# Patient Record
Sex: Female | Born: 1955
Health system: Southern US, Community
[De-identification: ages and names within clinical notes are randomized; demographics above are authoritative.]

## PROBLEM LIST (undated history)

## (undated) DIAGNOSIS — K219 Gastro-esophageal reflux disease without esophagitis: Secondary | ICD-10-CM

## (undated) DIAGNOSIS — M549 Dorsalgia, unspecified: Secondary | ICD-10-CM

## (undated) DIAGNOSIS — M2550 Pain in unspecified joint: Secondary | ICD-10-CM

## (undated) DIAGNOSIS — F988 Other specified behavioral and emotional disorders with onset usually occurring in childhood and adolescence: Secondary | ICD-10-CM

## (undated) DIAGNOSIS — K259 Gastric ulcer, unspecified as acute or chronic, without hemorrhage or perforation: Secondary | ICD-10-CM

## (undated) DIAGNOSIS — E739 Lactose intolerance, unspecified: Secondary | ICD-10-CM

## (undated) DIAGNOSIS — R600 Localized edema: Secondary | ICD-10-CM

## (undated) DIAGNOSIS — S82843A Displaced bimalleolar fracture of unspecified lower leg, initial encounter for closed fracture: Secondary | ICD-10-CM

## (undated) DIAGNOSIS — L400 Psoriasis vulgaris: Secondary | ICD-10-CM

## (undated) DIAGNOSIS — E785 Hyperlipidemia, unspecified: Secondary | ICD-10-CM

## (undated) DIAGNOSIS — G4730 Sleep apnea, unspecified: Secondary | ICD-10-CM

## (undated) DIAGNOSIS — I10 Essential (primary) hypertension: Secondary | ICD-10-CM

## (undated) DIAGNOSIS — R5383 Other fatigue: Secondary | ICD-10-CM

## (undated) DIAGNOSIS — R7303 Prediabetes: Secondary | ICD-10-CM

## (undated) DIAGNOSIS — E119 Type 2 diabetes mellitus without complications: Secondary | ICD-10-CM

## (undated) DIAGNOSIS — K5900 Constipation, unspecified: Secondary | ICD-10-CM

## (undated) DIAGNOSIS — R0602 Shortness of breath: Secondary | ICD-10-CM

## (undated) DIAGNOSIS — Z91018 Allergy to other foods: Secondary | ICD-10-CM

## (undated) DIAGNOSIS — Z227 Latent tuberculosis: Secondary | ICD-10-CM

## (undated) HISTORY — DX: Latent tuberculosis: Z22.7

## (undated) HISTORY — DX: Lactose intolerance, unspecified: E73.9

## (undated) HISTORY — DX: Other fatigue: R53.83

## (undated) HISTORY — DX: Gastro-esophageal reflux disease without esophagitis: K21.9

## (undated) HISTORY — DX: Dorsalgia, unspecified: M54.9

## (undated) HISTORY — DX: Hyperlipidemia, unspecified: E78.5

## (undated) HISTORY — DX: Constipation, unspecified: K59.00

## (undated) HISTORY — DX: Gastric ulcer, unspecified as acute or chronic, without hemorrhage or perforation: K25.9

## (undated) HISTORY — DX: Allergy to other foods: Z91.018

## (undated) HISTORY — DX: Shortness of breath: R06.02

## (undated) HISTORY — DX: Other specified behavioral and emotional disorders with onset usually occurring in childhood and adolescence: F98.8

## (undated) HISTORY — PX: TUBAL LIGATION: SHX77

## (undated) HISTORY — DX: Pain in unspecified joint: M25.50

## (undated) HISTORY — DX: Localized edema: R60.0

## (undated) HISTORY — DX: Psoriasis vulgaris: L40.0

## (undated) HISTORY — DX: Type 2 diabetes mellitus without complications: E11.9

## (undated) HISTORY — PX: FOOT SURGERY: SHX648

## (undated) HISTORY — PX: GASTRIC BYPASS: SHX52

## (undated) HISTORY — PX: COLONOSCOPY: SHX174

---

## 1998-05-06 ENCOUNTER — Encounter: Payer: Self-pay | Admitting: Emergency Medicine

## 1998-05-06 ENCOUNTER — Emergency Department (HOSPITAL_COMMUNITY): Admission: EM | Admit: 1998-05-06 | Discharge: 1998-05-06 | Payer: Self-pay | Admitting: Emergency Medicine

## 1998-07-05 ENCOUNTER — Other Ambulatory Visit: Admission: RE | Admit: 1998-07-05 | Discharge: 1998-07-05 | Payer: Self-pay | Admitting: *Deleted

## 1999-07-06 ENCOUNTER — Other Ambulatory Visit: Admission: RE | Admit: 1999-07-06 | Discharge: 1999-07-06 | Payer: Self-pay | Admitting: *Deleted

## 1999-09-25 ENCOUNTER — Emergency Department (HOSPITAL_COMMUNITY): Admission: EM | Admit: 1999-09-25 | Discharge: 1999-09-25 | Payer: Self-pay | Admitting: Emergency Medicine

## 1999-09-25 ENCOUNTER — Encounter: Payer: Self-pay | Admitting: Emergency Medicine

## 2003-05-19 ENCOUNTER — Other Ambulatory Visit: Admission: RE | Admit: 2003-05-19 | Discharge: 2003-05-19 | Payer: Self-pay | Admitting: Internal Medicine

## 2004-05-22 ENCOUNTER — Other Ambulatory Visit: Admission: RE | Admit: 2004-05-22 | Discharge: 2004-05-22 | Payer: Self-pay | Admitting: Internal Medicine

## 2004-06-01 ENCOUNTER — Encounter: Admission: RE | Admit: 2004-06-01 | Discharge: 2004-06-01 | Payer: Self-pay | Admitting: Internal Medicine

## 2004-07-26 ENCOUNTER — Encounter: Admission: RE | Admit: 2004-07-26 | Discharge: 2004-07-26 | Payer: Self-pay | Admitting: Internal Medicine

## 2005-06-04 ENCOUNTER — Emergency Department (HOSPITAL_COMMUNITY): Admission: EM | Admit: 2005-06-04 | Discharge: 2005-06-04 | Payer: Self-pay | Admitting: Emergency Medicine

## 2006-08-15 ENCOUNTER — Encounter (INDEPENDENT_AMBULATORY_CARE_PROVIDER_SITE_OTHER): Payer: Self-pay | Admitting: Interventional Cardiology

## 2006-08-15 ENCOUNTER — Inpatient Hospital Stay (HOSPITAL_COMMUNITY): Admission: EM | Admit: 2006-08-15 | Discharge: 2006-08-19 | Payer: Self-pay | Admitting: Emergency Medicine

## 2007-04-14 ENCOUNTER — Ambulatory Visit (HOSPITAL_COMMUNITY): Admission: RE | Admit: 2007-04-14 | Discharge: 2007-04-14 | Payer: Self-pay | Admitting: Dermatology

## 2007-06-09 ENCOUNTER — Other Ambulatory Visit: Admission: RE | Admit: 2007-06-09 | Discharge: 2007-06-09 | Payer: Self-pay | Admitting: *Deleted

## 2007-06-30 ENCOUNTER — Encounter: Admission: RE | Admit: 2007-06-30 | Discharge: 2007-06-30 | Payer: Self-pay | Admitting: Internal Medicine

## 2008-05-30 ENCOUNTER — Emergency Department (HOSPITAL_COMMUNITY): Admission: EM | Admit: 2008-05-30 | Discharge: 2008-05-30 | Payer: Self-pay | Admitting: Emergency Medicine

## 2008-06-14 ENCOUNTER — Encounter: Admission: RE | Admit: 2008-06-14 | Discharge: 2008-06-14 | Payer: Self-pay | Admitting: Orthopedic Surgery

## 2008-06-24 ENCOUNTER — Encounter: Admission: RE | Admit: 2008-06-24 | Discharge: 2008-06-24 | Payer: Self-pay | Admitting: Orthopedic Surgery

## 2008-06-30 ENCOUNTER — Encounter: Admission: RE | Admit: 2008-06-30 | Discharge: 2008-06-30 | Payer: Self-pay | Admitting: Internal Medicine

## 2008-10-01 ENCOUNTER — Encounter: Admission: RE | Admit: 2008-10-01 | Discharge: 2008-10-01 | Payer: Self-pay | Admitting: Orthopedic Surgery

## 2009-01-29 ENCOUNTER — Observation Stay (HOSPITAL_COMMUNITY): Admission: EM | Admit: 2009-01-29 | Discharge: 2009-01-30 | Payer: Self-pay | Admitting: Emergency Medicine

## 2009-08-15 ENCOUNTER — Encounter: Admission: RE | Admit: 2009-08-15 | Discharge: 2009-08-15 | Payer: Self-pay | Admitting: Internal Medicine

## 2010-02-02 ENCOUNTER — Emergency Department (HOSPITAL_COMMUNITY)
Admission: EM | Admit: 2010-02-02 | Discharge: 2010-02-02 | Payer: Self-pay | Source: Home / Self Care | Admitting: Emergency Medicine

## 2010-02-05 ENCOUNTER — Emergency Department (HOSPITAL_COMMUNITY)
Admission: EM | Admit: 2010-02-05 | Discharge: 2010-02-05 | Payer: Self-pay | Source: Home / Self Care | Admitting: Emergency Medicine

## 2010-05-28 ENCOUNTER — Encounter: Payer: Self-pay | Admitting: Internal Medicine

## 2010-05-29 ENCOUNTER — Encounter
Admission: RE | Admit: 2010-05-29 | Discharge: 2010-05-29 | Payer: Self-pay | Source: Home / Self Care | Attending: Family Medicine | Admitting: Family Medicine

## 2010-05-29 ENCOUNTER — Encounter: Payer: Self-pay | Admitting: Orthopedic Surgery

## 2010-08-11 LAB — URINALYSIS, ROUTINE W REFLEX MICROSCOPIC
Bilirubin Urine: NEGATIVE
Glucose, UA: NEGATIVE mg/dL
Ketones, ur: NEGATIVE mg/dL
Protein, ur: NEGATIVE mg/dL
pH: 7 (ref 5.0–8.0)

## 2010-08-11 LAB — CSF CULTURE W GRAM STAIN: Gram Stain: NONE SEEN

## 2010-08-11 LAB — CK TOTAL AND CKMB (NOT AT ARMC)
CK, MB: 1.3 ng/mL (ref 0.3–4.0)
Relative Index: 1.1 (ref 0.0–2.5)
Total CK: 117 U/L (ref 7–177)

## 2010-08-11 LAB — POCT I-STAT, CHEM 8
BUN: 6 mg/dL (ref 6–23)
Calcium, Ion: 1.16 mmol/L (ref 1.12–1.32)
Chloride: 106 meq/L (ref 96–112)
Creatinine, Ser: 0.7 mg/dL (ref 0.4–1.2)
Glucose, Bld: 93 mg/dL (ref 70–99)
HCT: 38 % (ref 36.0–46.0)
Hemoglobin: 12.9 g/dL (ref 12.0–15.0)
Potassium: 3.7 meq/L (ref 3.5–5.1)
Sodium: 142 meq/L (ref 135–145)
TCO2: 25 mmol/L (ref 0–100)

## 2010-08-11 LAB — COMPREHENSIVE METABOLIC PANEL
ALT: 19 U/L (ref 0–35)
BUN: 5 mg/dL — ABNORMAL LOW (ref 6–23)
Calcium: 8.7 mg/dL (ref 8.4–10.5)
Glucose, Bld: 141 mg/dL — ABNORMAL HIGH (ref 70–99)
Sodium: 135 mEq/L (ref 135–145)
Total Protein: 6.7 g/dL (ref 6.0–8.3)

## 2010-08-11 LAB — POCT CARDIAC MARKERS
CKMB, poc: <1 ng/mL — ABNORMAL LOW (ref 1.0–8.0)
Myoglobin, poc: 81.7 ng/mL (ref 12–200)
Troponin i, poc: <0.05 ng/mL (ref 0.00–0.09)

## 2010-08-11 LAB — CBC
Hemoglobin: 12.4 g/dL (ref 12.0–15.0)
MCHC: 33.5 g/dL (ref 30.0–36.0)
Platelets: 303 10*3/uL (ref 150–400)
RDW: 14.3 % (ref 11.5–15.5)

## 2010-08-11 LAB — DIFFERENTIAL
Lymphs Abs: 2.1 10*3/uL (ref 0.7–4.0)
Monocytes Relative: 5 % (ref 3–12)
Neutro Abs: 2.8 10*3/uL (ref 1.7–7.7)
Neutrophils Relative %: 54 % (ref 43–77)

## 2010-08-11 LAB — LIPID PANEL
LDL Cholesterol: 118 mg/dL — ABNORMAL HIGH (ref 0–99)
Total CHOL/HDL Ratio: 3.4 RATIO
VLDL: 9 mg/dL (ref 0–40)

## 2010-08-11 LAB — CARDIAC PANEL(CRET KIN+CKTOT+MB+TROPI)
CK, MB: 1.2 ng/mL (ref 0.3–4.0)
Troponin I: 0.01 ng/mL (ref 0.00–0.06)

## 2010-08-11 LAB — T3: T3, Total: 95.4 ng/dl (ref 80.0–204.0)

## 2010-08-11 LAB — CSF CELL COUNT WITH DIFFERENTIAL
RBC Count, CSF: 83 /mm3 — ABNORMAL HIGH
WBC, CSF: 1 /mm3 (ref 0–5)

## 2010-08-11 LAB — T4: T4, Total: 6.9 ug/dL (ref 5.0–12.5)

## 2010-08-21 ENCOUNTER — Other Ambulatory Visit: Payer: Self-pay | Admitting: Internal Medicine

## 2010-08-21 DIAGNOSIS — Z1231 Encounter for screening mammogram for malignant neoplasm of breast: Secondary | ICD-10-CM

## 2010-08-24 ENCOUNTER — Ambulatory Visit
Admission: RE | Admit: 2010-08-24 | Discharge: 2010-08-24 | Disposition: A | Discharge status: Home / Self Care | Payer: BC Managed Care – PPO | Source: Ambulatory Visit | Attending: Internal Medicine | Admitting: Internal Medicine

## 2010-08-24 DIAGNOSIS — Z1231 Encounter for screening mammogram for malignant neoplasm of breast: Secondary | ICD-10-CM

## 2010-09-22 NOTE — Discharge Summary (Signed)
Teresa Newton, Teresa Newton               ACCOUNT NO.:  [id number]   MEDICAL RECORD NO.:  [id number]          PATIENT TYPE:  INP   LOCATION:  3703                         FACILITY:  [facility]   PHYSICIAN:  Lyn Records, M.D.   DATE OF BIRTH:  1956-02-27   DATE OF ADMISSION:  08/15/2006  DATE OF DISCHARGE:  08/19/2006                               DISCHARGE SUMMARY   DISCHARGE DIAGNOSES:  1. Chest pain, noncardiac.  2. Hypertension.  3. Psoriasis.  4. Family history of sudden cardiac death at age 64.   Ms. Theissen is a 55 year old female who developed a sudden onset of chest  discomfort __________.  She was admitted to [facility] for further  evaluation.   These included transthoracic echocardiogram that showed normal EF of 60%  with no regional wall motion abnormalities.  White count 5.2, hemoglobin  11.5, hematocrit 34.1, platelets 275.  Sodium 136, potassium 3.8, BUN 5,  creatinine 0.58.  LFTs normal.  Cardiac isoenzymes negative.  BNP 113.  Cholesterol 159, triglycerides 64, LDL 102, HDL 44.  Chest x-ray, no  cardiopulmonary disease.   Patient ultimately underwent a Cardiolite that showed reversibility at  the apex; therefore, the patient was scheduled cardiac catheterization.  Patient underwent catheterization on August 19, 2006; this showed  angiographically normal coronary arteries.  The patient had an Angio-  Seal device placed and was ready for discharge to home that same day.  The patient was discharged to home in stable and improved condition.   DISCHARGE MEDICATIONS:  1. Lisinopril/HCTZ 25/12.5 mg daily.  2. Baby aspirin daily.   Clean cath site gently with soap and water, no scrubbing.  Increase  activity slowly.  No lifting over ten pounds for one week.  No driving  for one day.  Remain on a low sodium, heart-healthy diet.  Call for any  questions or concerns.  Follow up in one week with Dr. Donette Larry; she is to  call to make that appointment.  Also instructed was for the  patient to  stop taking Lasix.      Guy Franco, P.A.      Lyn Records, M.D.  Electronically Signed    LB/MEDQ  D:  10/31/2006  T:  10/31/2006  Job:  [id number]   cc:   Georgann Housekeeper, MD

## 2010-09-22 NOTE — H&P (Signed)
Teresa Newton, Teresa Newton               ACCOUNT NO.:  [id number]   MEDICAL RECORD NO.:  [id number]          PATIENT TYPE:  INP   LOCATION:  1826                         FACILITY:  [facility]   PHYSICIAN:  Barnetta Chapel, MDDATE OF BIRTH:  [birth year]   DATE OF ADMISSION:  08/15/2006  DATE OF DISCHARGE:                              HISTORY & PHYSICAL   PRIMARY CARE PHYSICIAN:  Georgann Housekeeper, MD.   CHIEF COMPLAINT:  Chest pain since this morning.   HISTORY OF PRESENTING COMPLAINT:  The patient is a 55 year old female  with past medical history significant for hypertension and psoriasis.  The patient only takes hydrochlorothiazide on a p.r.n. basis for her  hypertension.  The patient has a very strong family history for coronary  artery disease.  The patient presented with a sudden onset of severe  chest pain early this morning.  The pain is rated as 10/10, sharp-like  pain, lasted for about 5-10 minutes.  The pain is relieved by  nitroglycerin.  The pain is said to radiate to the left upper arm and  across her chest.  Associated shortness of breath, diaphoresis and  nausea.  The patient denied any fever or chills.  No productive cough,  neck pain abdominal pain, vomiting, urinary symptoms or joint aches.  The patient has headaches which may be associated with nitroglycerin.  Since presentation to the ER, she has had another episode of mild chest  pain which improved again with nitroglycerin.   PAST MEDICAL HISTORY:  Essentially as above.   MEDICATIONS PRIOR TO ADMISSION:  1. Soriatane one tablet every other day.  2. Lasix p.r.n.   ALLERGIES:  NIL.   SOCIAL HISTORY:  The patient is married with four children, one female and  three females.  The patient has denied history of alcohol use, cigarette  or illicit drug use.   FAMILY HISTORY:  The patient's mother had myocardial infarction in her  59s.  Her sister, who is in her 3s, just had an open heart bypass.  The  brother also had a  myocardial infarction in his 41s.   REVIEW OF SYSTEMS:  Essentially as above.   PHYSICAL EXAMINATION ON ADMISSION:  GENERAL APPEARANCE:  The patient is  not in any distress.  VITAL SIGNS:  Temperature 97.3, blood pressure 153/79 mmHg, heart rate  75, respiratory rate 16 and O2 saturation is 100%.  HEENT:  No pallor, no jaundice.  The external occular muscle movements  are intact.  LUNGS:  Clear to auscultation.  CARDIOVASCULAR:  S1 and S2, no heart murmur appreciated.  ABDOMEN:  Obese, soft and nontender, no organomegaly.  Bowel sounds are  present.  EXTREMITIES:  No edema.  NEUROLOGIC:  Nonfocal.   LABORATORY DATA:  So far the troponin is 0.02.   The EKG revealed possible signs of LVH.   IMPRESSION:  1. Chest pain.  2. Suspect acute coronary syndrome (likely unstable angina).  3. Hypertension, uncontrolled.   PLAN:  Will admit patient to telemetry floor under Dr. Donette Larry.  Will  start patient on subcutaneous Lovenox 1 mg/kg q.12h.  Will start ACE  inhibitor, aspirin, beta-blocker and nitroglycerin.  Dr. Donette Larry has  already contacted the cardiologist.  The patient will most likely have a  cardiac stress test in the morning and we will proceed from there.  Further management will depend on hospital course.  Meanwhile, will  manage the patient's high blood pressure.  We will also check fasting  lipid profile and fasting blood sugar.      Barnetta Chapel, MD  Electronically Signed     SIO/MEDQ  D:  08/15/2006  T:  08/15/2006  Job:  [id number]   cc:   Georgann Housekeeper, MD

## 2010-09-22 NOTE — Cardiovascular Report (Signed)
NAME:  Teresa Newton, Teresa Newton               ACCOUNT NO.:  [id number]   MEDICAL RECORD NO.:  [id number]          PATIENT TYPE:  INP   LOCATION:  3703                         FACILITY:  [facility]   PHYSICIAN:  Lyn Records, M.D.   DATE OF BIRTH:  10/21/1955   DATE OF PROCEDURE:  08/19/2006  DATE OF DISCHARGE:                            CARDIAC CATHETERIZATION   INDICATIONS FOR PROCEDURE:  Chest discomfort and recent Cardiolite study  suggesting apical ischemia.   PROCEDURE PERFORMED:  1. Left heart cath.  2. Coronary angiography.  3. Left ventriculography.  4. Angio-Seal.   DATE OF PROCEDURE:  August 19, 2006.   DESCRIPTION OF PROCEDURE:  After informed consent, a 6-French sheath was  placed on the right femoral artery using a modified Seldinger technique.  A 6-French A2 multipurpose catheter was used for hemodynamic recordings,  left ventriculography by hand injection, and selective right coronary  angiography.  A number four 6-French left Judkins catheter was used for  left coronary angiography.  Angio-Seal was used for hemostasis post  procedure.  The patient tolerated the procedure without complications.   RESULTS:  1. Hemodynamic data:      a.     Aortic pressure 154/80.      b.     Left ventricular pressure 154/4 mmHg.  2. Left ventriculography:  Left ventricular catheter size and function      normal EF is 60% to 70%.  3. Coronary angiography:      a.     Left main coronary:  Normal.      b.     Left anterior descending coronary:  Left anterior descending       coronary artery is a large vessel that wraps around the left       ventricular apex and in the distal vessel near the origin of the       second diagonal there is an eccentric 30% to 40% narrowing, but no       high grade obstruction is felt to be present in this region.  A       large proximal diagonal branch rises and is also free of       obstruction.      c.     Circumflex artery:  Circumflex coronary artery is down  a       vessel, gives a very large obtuse marginal distally, a more       moderate sized obtuse marginal proximally, and it is normal       throughout its course.      d.     Right coronary is nondominant.  The vessel is normal.   CONCLUSION:  1. Distal less than 40% LAD stenosis, otherwise normal coronary      arteries.  2. Normal left ventricular function.  3. Probable false positive Cardiolite unless the LAD lesion previously      contained thrombus due to plaque rupture, which is doubtful.   PLAN:  Risk factor modification and delayed later this morning.  Discharge later today if no symptoms.      Lyn Records,  M.D.     HWS/MEDQ  D:  08/19/2006  T:  08/19/2006  Job:  [id number]   cc:   Georgann Housekeeper, MD
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
## 2011-06-08 ENCOUNTER — Encounter (HOSPITAL_COMMUNITY): Payer: Self-pay | Admitting: Pharmacist

## 2011-06-12 ENCOUNTER — Inpatient Hospital Stay (HOSPITAL_COMMUNITY): Admission: RE | Admit: 2011-06-12 | Payer: BC Managed Care – PPO | Source: Ambulatory Visit

## 2011-06-15 ENCOUNTER — Encounter (HOSPITAL_COMMUNITY): Admission: RE | Payer: Self-pay | Source: Ambulatory Visit

## 2011-06-15 ENCOUNTER — Ambulatory Visit (HOSPITAL_COMMUNITY)
Admission: RE | Admit: 2011-06-15 | Payer: BC Managed Care – PPO | Source: Ambulatory Visit | Admitting: Obstetrics and Gynecology

## 2011-06-15 SURGERY — DILATATION AND CURETTAGE /HYSTEROSCOPY
Anesthesia: Choice

## 2011-08-08 ENCOUNTER — Other Ambulatory Visit: Payer: Self-pay | Admitting: Internal Medicine

## 2011-08-08 DIAGNOSIS — Z1231 Encounter for screening mammogram for malignant neoplasm of breast: Secondary | ICD-10-CM

## 2011-08-17 ENCOUNTER — Ambulatory Visit: Payer: BC Managed Care – PPO

## 2011-08-27 ENCOUNTER — Ambulatory Visit
Admission: RE | Admit: 2011-08-27 | Discharge: 2011-08-27 | Disposition: A | Discharge status: Home / Self Care | Payer: BC Managed Care – PPO | Source: Ambulatory Visit | Attending: Internal Medicine | Admitting: Internal Medicine

## 2011-08-27 DIAGNOSIS — Z1231 Encounter for screening mammogram for malignant neoplasm of breast: Secondary | ICD-10-CM

## 2012-10-02 ENCOUNTER — Other Ambulatory Visit: Payer: Self-pay | Admitting: Internal Medicine

## 2012-10-02 DIAGNOSIS — Z1231 Encounter for screening mammogram for malignant neoplasm of breast: Secondary | ICD-10-CM

## 2012-10-08 ENCOUNTER — Ambulatory Visit
Admission: RE | Admit: 2012-10-08 | Discharge: 2012-10-08 | Disposition: A | Discharge status: Home / Self Care | Payer: BC Managed Care – PPO | Source: Ambulatory Visit | Attending: Internal Medicine | Admitting: Internal Medicine

## 2012-10-08 DIAGNOSIS — Z1231 Encounter for screening mammogram for malignant neoplasm of breast: Secondary | ICD-10-CM

## 2013-09-14 ENCOUNTER — Other Ambulatory Visit: Payer: Self-pay

## 2013-09-14 DIAGNOSIS — Z1231 Encounter for screening mammogram for malignant neoplasm of breast: Secondary | ICD-10-CM

## 2013-10-09 ENCOUNTER — Ambulatory Visit
Admission: RE | Admit: 2013-10-09 | Discharge: 2013-10-09 | Disposition: A | Discharge status: Home / Self Care | Payer: BC Managed Care – PPO | Source: Ambulatory Visit

## 2013-10-09 ENCOUNTER — Encounter (INDEPENDENT_AMBULATORY_CARE_PROVIDER_SITE_OTHER): Payer: Self-pay

## 2013-10-09 DIAGNOSIS — Z1231 Encounter for screening mammogram for malignant neoplasm of breast: Secondary | ICD-10-CM

## 2014-04-13 ENCOUNTER — Other Ambulatory Visit: Payer: Self-pay | Admitting: Family

## 2014-04-13 ENCOUNTER — Ambulatory Visit
Admission: RE | Admit: 2014-04-13 | Discharge: 2014-04-13 | Disposition: A | Discharge status: Home / Self Care | Payer: BC Managed Care – PPO | Source: Ambulatory Visit | Attending: Family | Admitting: Family

## 2014-04-13 DIAGNOSIS — M542 Cervicalgia: Secondary | ICD-10-CM

## 2014-04-13 DIAGNOSIS — M545 Low back pain: Secondary | ICD-10-CM

## 2014-04-13 DIAGNOSIS — R200 Anesthesia of skin: Secondary | ICD-10-CM

## 2014-04-13 DIAGNOSIS — R079 Chest pain, unspecified: Secondary | ICD-10-CM

## 2014-09-06 ENCOUNTER — Other Ambulatory Visit: Payer: Self-pay

## 2014-09-06 DIAGNOSIS — Z1231 Encounter for screening mammogram for malignant neoplasm of breast: Secondary | ICD-10-CM

## 2014-10-18 ENCOUNTER — Ambulatory Visit
Admission: RE | Admit: 2014-10-18 | Discharge: 2014-10-18 | Disposition: A | Discharge status: Home / Self Care | Payer: BC Managed Care – PPO | Source: Ambulatory Visit

## 2014-10-18 ENCOUNTER — Encounter (INDEPENDENT_AMBULATORY_CARE_PROVIDER_SITE_OTHER): Payer: Self-pay

## 2014-10-18 DIAGNOSIS — Z1231 Encounter for screening mammogram for malignant neoplasm of breast: Secondary | ICD-10-CM

## 2015-08-04 ENCOUNTER — Other Ambulatory Visit: Payer: Self-pay | Admitting: Internal Medicine

## 2015-08-04 DIAGNOSIS — R311 Benign essential microscopic hematuria: Secondary | ICD-10-CM

## 2015-08-09 ENCOUNTER — Other Ambulatory Visit: Payer: BC Managed Care – PPO

## 2015-08-23 ENCOUNTER — Encounter (HOSPITAL_COMMUNITY): Payer: Self-pay | Admitting: Emergency Medicine

## 2015-08-23 ENCOUNTER — Emergency Department (HOSPITAL_COMMUNITY)
Admission: EM | Admit: 2015-08-23 | Discharge: 2015-08-23 | Disposition: A | Discharge status: Home / Self Care | Payer: BC Managed Care – PPO | Attending: Emergency Medicine | Admitting: Emergency Medicine

## 2015-08-23 ENCOUNTER — Emergency Department (HOSPITAL_COMMUNITY): Payer: BC Managed Care – PPO

## 2015-08-23 DIAGNOSIS — Z880 Allergy status to penicillin: Secondary | ICD-10-CM | POA: Insufficient documentation

## 2015-08-23 DIAGNOSIS — Z7982 Long term (current) use of aspirin: Secondary | ICD-10-CM | POA: Insufficient documentation

## 2015-08-23 DIAGNOSIS — R109 Unspecified abdominal pain: Secondary | ICD-10-CM

## 2015-08-23 DIAGNOSIS — E119 Type 2 diabetes mellitus without complications: Secondary | ICD-10-CM | POA: Insufficient documentation

## 2015-08-23 DIAGNOSIS — K529 Noninfective gastroenteritis and colitis, unspecified: Secondary | ICD-10-CM | POA: Diagnosis not present

## 2015-08-23 DIAGNOSIS — Z79899 Other long term (current) drug therapy: Secondary | ICD-10-CM | POA: Insufficient documentation

## 2015-08-23 DIAGNOSIS — I10 Essential (primary) hypertension: Secondary | ICD-10-CM | POA: Diagnosis not present

## 2015-08-23 DIAGNOSIS — R1084 Generalized abdominal pain: Secondary | ICD-10-CM | POA: Diagnosis present

## 2015-08-23 HISTORY — DX: Essential (primary) hypertension: I10

## 2015-08-23 LAB — COMPREHENSIVE METABOLIC PANEL
ALK PHOS: 76 U/L (ref 38–126)
ALT: 38 U/L (ref 14–54)
ANION GAP: 9 (ref 5–15)
AST: 37 U/L (ref 15–41)
Albumin: 3.9 g/dL (ref 3.5–5.0)
BUN: 19 mg/dL (ref 6–20)
CALCIUM: 9.1 mg/dL (ref 8.9–10.3)
CO2: 25 mmol/L (ref 22–32)
Chloride: 104 mmol/L (ref 101–111)
Creatinine, Ser: 1.07 mg/dL — ABNORMAL HIGH (ref 0.44–1.00)
GFR calc non Af Amer: 56 mL/min — ABNORMAL LOW (ref >60)
GLUCOSE: 132 mg/dL — AB (ref 65–99)
Potassium: 3.2 mmol/L — ABNORMAL LOW (ref 3.5–5.1)
Sodium: 138 mmol/L (ref 135–145)
Total Bilirubin: 0.7 mg/dL (ref 0.3–1.2)
Total Protein: 7.4 g/dL (ref 6.5–8.1)

## 2015-08-23 LAB — URINE MICROSCOPIC-ADD ON: RBC / HPF: NONE SEEN RBC/hpf (ref 0–5)

## 2015-08-23 LAB — LIPASE, BLOOD: Lipase: 24 U/L (ref 11–51)

## 2015-08-23 LAB — URINALYSIS, ROUTINE W REFLEX MICROSCOPIC
BILIRUBIN URINE: NEGATIVE
GLUCOSE, UA: NEGATIVE mg/dL
Ketones, ur: 15 mg/dL — AB
Leukocytes, UA: NEGATIVE
Nitrite: NEGATIVE
Protein, ur: NEGATIVE mg/dL
SPECIFIC GRAVITY, URINE: 1.019 (ref 1.005–1.030)
pH: 5.5 (ref 5.0–8.0)

## 2015-08-23 LAB — CBC
HCT: 37.4 % (ref 36.0–46.0)
HEMOGLOBIN: 12.1 g/dL (ref 12.0–15.0)
MCH: 29.3 pg (ref 26.0–34.0)
MCHC: 32.4 g/dL (ref 30.0–36.0)
MCV: 90.6 fL (ref 78.0–100.0)
Platelets: 215 10*3/uL (ref 150–400)
RBC: 4.13 MIL/uL (ref 3.87–5.11)
RDW: 13.5 % (ref 11.5–15.5)
WBC: 4 10*3/uL (ref 4.0–10.5)

## 2015-08-23 MED ORDER — SODIUM CHLORIDE 0.9 % IV BOLUS (SEPSIS)
1000.0000 mL | Freq: Once | INTRAVENOUS | Status: AC
  Administered 2015-08-23: 1000 mL via INTRAVENOUS

## 2015-08-23 MED ORDER — ONDANSETRON HCL 4 MG/2ML IJ SOLN
4.0000 mg | Freq: Once | INTRAMUSCULAR | Status: DC
  Filled 2015-08-23: qty 2

## 2015-08-23 MED ORDER — IOPAMIDOL (ISOVUE-300) INJECTION 61%
INTRAVENOUS | Status: AC
  Administered 2015-08-23: 100 mL
  Filled 2015-08-23: qty 100

## 2015-08-23 NOTE — Discharge Instructions (Signed)
Return here as needed.  Follow-up with a primary care doctor.  Your CT scan did not show any abnormalities.  However, this could be an early process that has yet to slowly declare itself.  Monitor your symptoms and return for any worsening in your condition

## 2015-08-23 NOTE — ED Notes (Signed)
Pt sts mid abd pain and nausea upon waking today; pt sts hx of gastric bypass

## 2015-08-23 NOTE — ED Provider Notes (Signed)
CSN: [id number]     Arrival date & time 08/23/15  1342 History   First MD Initiated Contact with Patient 08/23/15 1629     Chief Complaint  Patient presents with  . Abdominal Pain  . Nausea   HPI  The patient is a 60 year old black female PMH of HTN, DMT2 diet controlled, gastric bypass 2 years prior who presented today with acute abdominal pain 10/10 onset which awoke patient from nap at noon. Prior to that patient exercised, ate breakfast and went about her regular morning when awoke with pain. At that point she could not localize pain, generalized abdomen, mild nausea with sweating and lightheadedness, denies recent fever, vomiting, or bowel changes no diarrhea or constipation symptoms. Since gastric bypass patient has never experienced dumping syndrome or acute abdominal pain following surgery, she continues to take her vitamins daily. Since then the pain has subsided slightly and migrated to the left lower quadrant and now she states it is on and off since presenting to the ED.The patient denies chest pain, shortness of breath, headache,blurred vision, neck pain, fever, cough, weakness, numbness, dizziness, anorexia, edema, rash, back pain, dysuria, hematemesis, bloody stool, near syncope, or syncope.   Past Medical History  Diagnosis Date  . Hypertension   . Diabetes mellitus without complication (HCC)    History reviewed. No pertinent past surgical history. History reviewed. No pertinent family history. Social History  Substance Use Topics  . Smoking status: Never Smoker   . Smokeless tobacco: None  . Alcohol Use: No   OB History    No data available     Review of Systems  Constitutional: Negative for fever, diaphoresis and appetite change.  Respiratory: Negative for shortness of breath.   Cardiovascular: Negative for chest pain and palpitations.  Gastrointestinal: Positive for nausea and abdominal pain. Negative for vomiting, diarrhea, constipation, blood in stool and abdominal  distention.    Allergies  Penicillins  Home Medications   Prior to Admission medications   Medication Sig Start Date End Date Taking? Authorizing Provider  aspirin 81 MG tablet Take 81 mg by mouth daily.    Historical Provider, MD  Liraglutide (VICTOZA) 18 MG/3ML SOLN Inject 1.8 mg into the skin daily.    Historical Provider, MD  lisinopril-hydrochlorothiazide (PRINZIDE,ZESTORETIC) 20-25 MG per tablet Take 1 tablet by mouth daily.    Historical Provider, MD   BP 113/61 mmHg  Pulse 50  Temp(Src) 97.9 F (36.6 C) (Oral)  Resp 11  SpO2 100% Physical Exam  Constitutional: She is oriented to person, place, and time. She appears well-developed and well-nourished.  HENT:  Head: Normocephalic and atraumatic.  Mouth/Throat: Oropharynx is clear and moist.  Eyes: Pupils are equal, round, and reactive to light.  Neck: Normal range of motion. Neck supple.  Cardiovascular: Normal rate, regular rhythm, normal heart sounds and intact distal pulses.  Exam reveals no friction rub.   No murmur heard. Pulmonary/Chest: Effort normal and breath sounds normal. No respiratory distress. She has no wheezes. She has no rales.  Abdominal: Soft. Bowel sounds are normal. She exhibits no distension. There is tenderness. There is no rebound and no guarding.  Tenderness to palpation over RUQ and RLQ, no rebound tenderness, mild guarding with right sided exam. No left sided abdominal tenderness.  Neurological: She is alert and oriented to person, place, and time. She exhibits normal muscle tone. Coordination normal.  Skin: Skin is warm and dry. No rash noted. No erythema.  Nursing note and vitals reviewed.  ED Course  Procedures (including critical care time) Labs Review Labs Reviewed  COMPREHENSIVE METABOLIC PANEL - Abnormal; Notable for the following:    Potassium 3.2 (*)    Glucose, Bld 132 (*)    Creatinine, Ser 1.07 (*)    GFR calc non Af Amer 56 (*)    All other components within normal limits   URINALYSIS, ROUTINE W REFLEX MICROSCOPIC (NOT AT [facility]) - Abnormal; Notable for the following:    Hgb urine dipstick TRACE (*)    Ketones, ur 15 (*)    All other components within normal limits  URINE MICROSCOPIC-ADD ON - Abnormal; Notable for the following:    Squamous Epithelial / LPF 0-5 (*)    Bacteria, UA FEW (*)    All other components within normal limits  URINE CULTURE  LIPASE, BLOOD  CBC    Imaging Review Ct Abdomen Pelvis W Contrast  08/23/2015  CLINICAL DATA:  Acute generalized abdominal pain. EXAM: CT ABDOMEN AND PELVIS WITH CONTRAST TECHNIQUE: Multidetector CT imaging of the abdomen and pelvis was performed using the standard protocol following bolus administration of intravenous contrast. CONTRAST:  1 ISOVUE-300 IOPAMIDOL (ISOVUE-300) INJECTION 61% COMPARISON:  None. FINDINGS: Severe degenerative disc disease is seen at L5-S1. Visualized lung bases are unremarkable. No gallstones are noted. Status post gastric bypass. The liver, spleen and pancreas appear normal. Adrenal glands and kidneys appear normal. No hydronephrosis or renal obstruction is noted. The appendix appears normal. There is no evidence of bowel obstruction. No abnormal fluid collection is noted. Urinary bladder appears normal. Uterus and ovaries are unremarkable. No significant adenopathy is noted. IMPRESSION: No acute abnormality seen in the abdomen or pelvis. Electronically Signed   By: Lupita RaiderJames  Green Jr, M.D.   On: 08/23/2015 18:38   I have personally reviewed and evaluated these images and lab results as part of my medical decision-making.   MDM   The patient is a 60 year old black female presents with acute onset of abdominal pain. CT ordered to r/o appendicitis vs diverticulitis vs gallbladder dz vs pancreatitis. Due to acute onset of pain, appendicitis is less likely however physical exam shows RLQ tenderness. CMP reveals mild dehydration and hypokalemia, possibly due to hx of gastric bypass vs hypertensive  medications.   Patient will be discharged home with a gastroenteritis and abdominal pain of undifferentiated origin.  The patient is advised this could be an early process that is yet fully declared itself.  The patient agrees the plan and all actions were answered.  She is advised to follow with her primary care doctor  Charlestine NightChristopher Payge Eppes, PA-C 08/23/15 1926  Derwood KaplanAnkit Nanavati, MD 08/24/15 1329

## 2015-08-23 NOTE — ED Notes (Signed)
Pt left with all her belongings and ambulated out the treatment area.  

## 2015-08-23 NOTE — ED Notes (Signed)
Patient transported to CT 

## 2015-08-23 NOTE — ED Notes (Signed)
Pt left with all her belongings and ambulated out of the treatment area.  

## 2015-08-27 LAB — URINE CULTURE: Culture: >=100000 — AB

## 2015-08-28 ENCOUNTER — Telehealth: Payer: Self-pay

## 2015-08-28 NOTE — Telephone Encounter (Signed)
Post ED Visit - Positive Culture Follow-up  Culture report reviewed by antimicrobial stewardship pharmacist:  []  Teresa Newton, Pharm.D. []  Teresa Newton, Pharm.D., BCPS []  Teresa Newton, Pharm.D. []  Teresa Newton, Pharm.D., BCPS []  Teresa Newton, [address]Ph[address] BCPS, AAHIVP []  Teresa Newton, Pharm.D., BCPS, AAHIVP [x]  Teresa Newton, Pharm.D. []  Teresa Newton, [address]Ph[address]  Positive urine culture no further patient follow-up is required at this time.  Teresa Newton, Teresa Newton 08/28/2015, 8:44 AM

## 2015-08-28 NOTE — Progress Notes (Signed)
ED Antimicrobial Stewardship Positive Culture Follow Up   Teresa Newton is an 60 y.o. female [facility] with a chief complaint of  Chief Complaint  Patient presents with  . Abdominal Pain  . Nausea    Recent Results (from the past 720 hour(s))  Urine culture     Status: Abnormal   Collection Time: 08/23/15  4:57 PM  Result Value Ref Range Status   Specimen Description URINE, RANDOM  Final   Special Requests NONE  Final   Culture (A)  Final    >=100,000 COLONIES/mL ESCHERICHIA COLI 50,000 COLONIES/mL KLEBSIELLA PNEUMONIAE    Report Status 08/27/2015 FINAL  Final   Organism ID, Bacteria ESCHERICHIA COLI (A)  Final   Organism ID, Bacteria KLEBSIELLA PNEUMONIAE (A)  Final      Susceptibility   Escherichia coli - MIC*    AMPICILLIN 8 SENSITIVE Sensitive     CEFAZOLIN <=4 SENSITIVE Sensitive     CEFTRIAXONE <=1 SENSITIVE Sensitive     CIPROFLOXACIN <=0.25 SENSITIVE Sensitive     GENTAMICIN <=1 SENSITIVE Sensitive     IMIPENEM <=0.25 SENSITIVE Sensitive     NITROFURANTOIN <=16 SENSITIVE Sensitive     TRIMETH/SULFA <=20 SENSITIVE Sensitive     AMPICILLIN/SULBACTAM 4 SENSITIVE Sensitive     PIP/TAZO <=4 SENSITIVE Sensitive     * >=100,000 COLONIES/mL ESCHERICHIA COLI   Klebsiella pneumoniae - MIC*    AMPICILLIN >=32 RESISTANT Resistant     CEFAZOLIN <=4 SENSITIVE Sensitive     CEFTRIAXONE <=1 SENSITIVE Sensitive     CIPROFLOXACIN <=0.25 SENSITIVE Sensitive     GENTAMICIN <=1 SENSITIVE Sensitive     IMIPENEM <=0.25 SENSITIVE Sensitive     NITROFURANTOIN 32 SENSITIVE Sensitive     TRIMETH/SULFA <=20 SENSITIVE Sensitive     AMPICILLIN/SULBACTAM 4 SENSITIVE Sensitive     PIP/TAZO 8 SENSITIVE Sensitive     * 50,000 COLONIES/mL KLEBSIELLA PNEUMONIAE    No urinary symptoms. UA negative. No treatment indicated.  ED Provider: Blane OharaJoshua Zavitz, MD  Cassie L. Roseanne RenoStewart, PharmD PGY2 Infectious Diseases Pharmacy Resident Pager: [phone] 08/28/2015 8:34  AM

## 2015-10-07 ENCOUNTER — Other Ambulatory Visit: Payer: Self-pay | Admitting: Internal Medicine

## 2015-10-07 ENCOUNTER — Other Ambulatory Visit: Payer: Self-pay

## 2015-10-07 DIAGNOSIS — Z1231 Encounter for screening mammogram for malignant neoplasm of breast: Secondary | ICD-10-CM

## 2015-10-18 ENCOUNTER — Ambulatory Visit (INDEPENDENT_AMBULATORY_CARE_PROVIDER_SITE_OTHER): Payer: BC Managed Care – PPO | Admitting: Internal Medicine

## 2015-10-18 ENCOUNTER — Encounter: Payer: Self-pay | Admitting: Internal Medicine

## 2015-10-18 ENCOUNTER — Ambulatory Visit
Admission: RE | Admit: 2015-10-18 | Discharge: 2015-10-18 | Disposition: A | Discharge status: Home / Self Care | Payer: BC Managed Care – PPO | Source: Ambulatory Visit | Attending: Internal Medicine | Admitting: Internal Medicine

## 2015-10-18 VITALS — BP 132/74 | HR 80 | Temp 98.7°F | Wt 194.0 lb

## 2015-10-18 DIAGNOSIS — L409 Psoriasis, unspecified: Secondary | ICD-10-CM

## 2015-10-18 DIAGNOSIS — Z227 Latent tuberculosis: Secondary | ICD-10-CM

## 2015-10-18 DIAGNOSIS — R7611 Nonspecific reaction to tuberculin skin test without active tuberculosis: Secondary | ICD-10-CM

## 2015-10-18 NOTE — Progress Notes (Signed)
RFV: referred to clinic for LTBI for biologics for severe plaque psoriasis Subjective:    Patient ID: Teresa Newton, female    DOB: [birth year], 60 y.o.   MRN: [id number]  HPI 60yo F with history of plaque psoriasis is referred to clinic due to positive quantiferon. The patient reports that when she lived in [place], her mother was diagnosed with mTB. The patient and her siblings had all been exposed to mTB from her mother, and were treated for LTBI as a child (U40 yo).  She was enrolled in clinical trial for cosentyx, IL-17a antagonist in 2012 to treat her severe plaque psoriasis. She had been on the medication for roughly 5 years without difficulty and had good response to treatment. She was initially screened with QTF, but mentioned her mTB exposure/LTBI treatment and also screened by CXR. She had been off of her meds for 6 months but started to have recurrence of her psoriasis. She is being seen by Dr. Sharyn Lull who did baseline labs for consideration of reinitiation of cosyntex. Her QTF is positive, and in her old records, her QTF in 2012 was negative  The patient still does not endorse any symptoms concerning for pulmonary TB. She does not have any risk factors that would be suggestive re-exposure to pulmonary TB. She is a retired Surveyor, mining, no volunteering in homeless shelters or prisons. No recent contact from health dept to suggest re-exposure.  She has upcoming cruise to [place] and [place]  Allergies  Allergen Reactions  . Fish Allergy Anaphylaxis  . Milk-Related Compounds Anaphylaxis    Goat Milk ONLY   . Penicillins Hives   Current Outpatient Prescriptions on File Prior to Visit  Medication Sig Dispense Refill  . aspirin 81 MG tablet Take 81 mg by mouth daily.    Marland Kitchen CALCIUM PO Take 1 tablet by mouth daily.    . Cyanocobalamin (B-12 PO) Take 1 tablet by mouth daily.    Marland Kitchen EPINEPHrine 0.3 mg/0.3 mL IJ SOAJ injection Inject 0.3 mg into the muscle once.    .  hydrochlorothiazide (MICROZIDE) 12.5 MG capsule Take 12.5 mg by mouth daily.    Marland Kitchen lisinopril-hydrochlorothiazide (PRINZIDE,ZESTORETIC) 20-25 MG per tablet Take 1 tablet by mouth daily.    . Multiple Vitamin (MULTI VITAMIN PO) Take 1 tablet by mouth daily.    . Omega-3 Fatty Acids (OMEGA 3 PO) Take 1 capsule by mouth daily.    Marland Kitchen POTASSIUM PO Take 1 tablet by mouth daily.    . Prenatal Vit-Fe Fumarate-FA (PRENATAL PO) Take 1 tablet by mouth daily.     No current facility-administered medications on file prior to visit.   Active Ambulatory Problems    Diagnosis Date Noted  . No Active Ambulatory Problems   Resolved Ambulatory Problems    Diagnosis Date Noted  . No Resolved Ambulatory Problems   Past Medical History  Diagnosis Date  . Hypertension   . Diabetes mellitus without complication [facility])    Social History  Substance Use Topics  . Smoking status: Never Smoker   . Smokeless tobacco: Never Used  . Alcohol Use: No   Family hx: psoriasis- brother. Mother with mTB    Review of Systems Review of Systems  Constitutional: Negative for fever, chills, diaphoresis, activity change, appetite change, fatigue and unexpected weight change.  HENT: Negative for congestion, sore throat, rhinorrhea, sneezing, trouble swallowing and sinus pressure.  Eyes: Negative for photophobia and visual disturbance.  Respiratory: Negative for cough, chest tightness, shortness of breath, wheezing  and stridor.  Cardiovascular: Negative for chest pain, palpitations and leg swelling.  Gastrointestinal: Negative for nausea, vomiting, abdominal pain, diarrhea, constipation, blood in stool, abdominal distention and anal bleeding.  Genitourinary: Negative for dysuria, hematuria, flank pain and difficulty urinating.  Musculoskeletal: Negative for myalgias, back pain, joint swelling, arthralgias and gait problem.  Skin: Negative for color change, pallor, rash and wound.  Neurological: Negative for dizziness,  tremors, weakness and light-headedness.  Hematological: Negative for adenopathy. Does not bruise/bleed easily.  Psychiatric/Behavioral: Negative for behavioral problems, confusion, sleep disturbance, dysphoric mood, decreased concentration and agitation.       Objective:   Physical Exam  BP 132/74 mmHg  Pulse 80  Temp(Src) 98.7 F (37.1 C) (Oral)  Wt 194 lb (87.998 kg) Physical Exam  Constitutional:  oriented to person, place, and time. appears well-developed and well-nourished. No distress.  HENT: [place]/AT, PERRLA, no scleral icterus Mouth/Throat: Oropharynx is clear and moist. No oropharyngeal exudate.  Cardiovascular: Normal rate, regular rhythm and normal heart sounds. Exam reveals no gallop and no friction rub.  No murmur heard.  Pulmonary/Chest: Effort normal and breath sounds normal. No respiratory distress.  has no wheezes.  Neck = supple, no nuchal rigidity Lymphadenopathy: no cervical adenopathy. No axillary adenopathy Skin: Skin is warm and dry. No rash noted. No erythema.  Psychiatric: a normal mood and affect.  behavior is normal.     Assessment & Plan:   History of mTB exposure/LTBI treatment as a child = agree that future screening should be based on symptom questionnaire and serial cxr. We Newton get cxr today to see if any signs concerning for active TB. She does not have any symptoms to suggest active infection.  I reviewed records from [facility] to see mTB screening results which was only done once at start of clinical trial and reported as negative  Newton repeat QTF and HIV ab and do CXR since not recently done.  Addendum:  CXR does not show signs suggestive of active TB Repeat QTF was negative HIV ab is negative as health screening -----------------------------  Recommendations: she does not need latent TB treatment prior to initiation cosentyx at this time. She has already received treatment as a child. Recent studies do not suggest that she has had  re-exposure.

## 2015-10-19 ENCOUNTER — Ambulatory Visit
Admission: RE | Admit: 2015-10-19 | Discharge: 2015-10-19 | Disposition: A | Discharge status: Home / Self Care | Payer: BC Managed Care – PPO | Source: Ambulatory Visit | Attending: Internal Medicine | Admitting: Internal Medicine

## 2015-10-19 DIAGNOSIS — Z1231 Encounter for screening mammogram for malignant neoplasm of breast: Secondary | ICD-10-CM

## 2015-10-19 LAB — HIV ANTIBODY (ROUTINE TESTING W REFLEX): HIV: NONREACTIVE

## 2015-10-21 LAB — QUANTIFERON TB GOLD ASSAY (BLOOD)
Interferon Gamma Release Assay: NEGATIVE
MITOGEN-NIL SO: 7.88 [IU]/mL
QUANTIFERON NIL VALUE: 0.05 [IU]/mL
QUANTIFERON TB AG MINUS NIL: 0.26 [IU]/mL

## 2015-11-01 ENCOUNTER — Encounter: Payer: Self-pay | Admitting: *Deleted

## 2016-09-19 ENCOUNTER — Other Ambulatory Visit: Payer: Self-pay | Admitting: Internal Medicine

## 2016-09-19 DIAGNOSIS — Z1231 Encounter for screening mammogram for malignant neoplasm of breast: Secondary | ICD-10-CM

## 2016-09-26 ENCOUNTER — Other Ambulatory Visit: Payer: Self-pay | Admitting: Obstetrics and Gynecology

## 2016-09-26 DIAGNOSIS — E2839 Other primary ovarian failure: Secondary | ICD-10-CM

## 2016-10-19 ENCOUNTER — Ambulatory Visit
Admission: RE | Admit: 2016-10-19 | Discharge: 2016-10-19 | Disposition: A | Discharge status: Home / Self Care | Payer: BC Managed Care – PPO | Source: Ambulatory Visit | Attending: Internal Medicine | Admitting: Internal Medicine

## 2016-10-19 DIAGNOSIS — Z1231 Encounter for screening mammogram for malignant neoplasm of breast: Secondary | ICD-10-CM

## 2017-09-10 ENCOUNTER — Other Ambulatory Visit: Payer: Self-pay | Admitting: Internal Medicine

## 2017-09-10 ENCOUNTER — Other Ambulatory Visit: Payer: Self-pay | Admitting: Obstetrics and Gynecology

## 2017-09-10 DIAGNOSIS — Z1231 Encounter for screening mammogram for malignant neoplasm of breast: Secondary | ICD-10-CM

## 2017-10-21 ENCOUNTER — Ambulatory Visit
Admission: RE | Admit: 2017-10-21 | Discharge: 2017-10-21 | Disposition: A | Discharge status: Home / Self Care | Payer: BC Managed Care – PPO | Source: Ambulatory Visit | Attending: Internal Medicine | Admitting: Internal Medicine

## 2017-10-21 DIAGNOSIS — Z1231 Encounter for screening mammogram for malignant neoplasm of breast: Secondary | ICD-10-CM

## 2017-11-04 DIAGNOSIS — Z6836 Body mass index (BMI) 36.0-36.9, adult: Secondary | ICD-10-CM | POA: Insufficient documentation

## 2017-11-04 DIAGNOSIS — E119 Type 2 diabetes mellitus without complications: Secondary | ICD-10-CM | POA: Insufficient documentation

## 2017-11-04 DIAGNOSIS — I10 Essential (primary) hypertension: Secondary | ICD-10-CM | POA: Insufficient documentation

## 2017-11-04 DIAGNOSIS — I152 Hypertension secondary to endocrine disorders: Secondary | ICD-10-CM | POA: Insufficient documentation

## 2017-11-04 DIAGNOSIS — E1169 Type 2 diabetes mellitus with other specified complication: Secondary | ICD-10-CM | POA: Insufficient documentation

## 2017-11-04 LAB — BASIC METABOLIC PANEL
BUN: 21 (ref 4–21)
Creatinine: 1 (ref 0.5–1.1)
Glucose: 60
POTASSIUM: 4.4 (ref 3.4–5.3)
Sodium: 138 (ref 137–147)

## 2017-11-04 LAB — HEMOGLOBIN A1C: HEMOGLOBIN A1C: 5.6

## 2018-01-20 ENCOUNTER — Emergency Department (HOSPITAL_COMMUNITY): Payer: BC Managed Care – PPO

## 2018-01-20 ENCOUNTER — Encounter (HOSPITAL_COMMUNITY): Payer: Self-pay | Admitting: *Deleted

## 2018-01-20 ENCOUNTER — Other Ambulatory Visit: Payer: Self-pay

## 2018-01-20 ENCOUNTER — Emergency Department (HOSPITAL_COMMUNITY)
Admission: EM | Admit: 2018-01-20 | Discharge: 2018-01-20 | Disposition: A | Discharge status: Home / Self Care | Payer: BC Managed Care – PPO | Attending: Emergency Medicine | Admitting: Emergency Medicine

## 2018-01-20 DIAGNOSIS — S8991XA Unspecified injury of right lower leg, initial encounter: Secondary | ICD-10-CM | POA: Diagnosis present

## 2018-01-20 DIAGNOSIS — Y999 Unspecified external cause status: Secondary | ICD-10-CM | POA: Diagnosis not present

## 2018-01-20 DIAGNOSIS — W108XXA Fall (on) (from) other stairs and steps, initial encounter: Secondary | ICD-10-CM | POA: Diagnosis not present

## 2018-01-20 DIAGNOSIS — I10 Essential (primary) hypertension: Secondary | ICD-10-CM | POA: Diagnosis not present

## 2018-01-20 DIAGNOSIS — E119 Type 2 diabetes mellitus without complications: Secondary | ICD-10-CM | POA: Insufficient documentation

## 2018-01-20 DIAGNOSIS — S82401A Unspecified fracture of shaft of right fibula, initial encounter for closed fracture: Secondary | ICD-10-CM | POA: Insufficient documentation

## 2018-01-20 DIAGNOSIS — Y929 Unspecified place or not applicable: Secondary | ICD-10-CM | POA: Insufficient documentation

## 2018-01-20 DIAGNOSIS — S82201A Unspecified fracture of shaft of right tibia, initial encounter for closed fracture: Secondary | ICD-10-CM | POA: Insufficient documentation

## 2018-01-20 DIAGNOSIS — Z7982 Long term (current) use of aspirin: Secondary | ICD-10-CM | POA: Insufficient documentation

## 2018-01-20 DIAGNOSIS — Y9301 Activity, walking, marching and hiking: Secondary | ICD-10-CM | POA: Diagnosis not present

## 2018-01-20 MED ORDER — HYDROCODONE-ACETAMINOPHEN 5-325 MG PO TABS: 1.0000 | ORAL_TABLET | ORAL | 0 refills | Status: DC | PRN

## 2018-01-20 NOTE — ED Notes (Signed)
Paged ortho tech 

## 2018-01-20 NOTE — ED Provider Notes (Signed)
[facility] Provider Note   CSN: [id number] Arrival date & time: 01/20/18  1357     History   Chief Complaint Chief Complaint  Patient presents with  . Ankle Pain    HPI Teresa Newton is a 62 y.o. female.  62 yo female presents with complaint of right ankle injury, missed a step going down stairs today and rolled her ankle. Pain to medial and lateral right ankle, unable to bear weight after the fall. No other injuries, complaints, concerns.     Past Medical History:  Diagnosis Date  . Diabetes mellitus without complication (HCC)   . Hypertension   . LTBI (latent tuberculosis infection)   . Plaque psoriasis     Patient Active Problem List   Diagnosis Date Noted  . Psoriasis 10/18/2015  . LTBI (latent tuberculosis infection) 10/18/2015    History reviewed. No pertinent surgical history.   OB History   None      Home Medications    Prior to Admission medications   Medication Sig Start Date End Date Taking? Authorizing Provider  aspirin 81 MG tablet Take 81 mg by mouth daily.    [provider]  CALCIUM PO Take 1 tablet by mouth daily.    [provider]  Cyanocobalamin (B-12 PO) Take 1 tablet by mouth daily.    [provider]  EPINEPHrine 0.3 mg/0.3 mL IJ SOAJ injection Inject 0.3 mg into the muscle once.    [provider]  hydrochlorothiazide (MICROZIDE) 12.5 MG capsule Take 12.5 mg by mouth daily.    [provider]  HYDROcodone-acetaminophen (NORCO/VICODIN) 5-325 MG tablet Take 1 tablet by mouth every 4 (four) hours as needed for up to 12 doses. 01/20/18   Jeannie FendMurphy, Laura A, PA-C  lisinopril-hydrochlorothiazide (PRINZIDE,ZESTORETIC) 20-25 MG per tablet Take 1 tablet by mouth daily.    [provider]  Multiple Vitamin (MULTI VITAMIN PO) Take 1 tablet by mouth daily.    [provider]  Omega-3 Fatty Acids (OMEGA 3 PO) Take 1 capsule by mouth daily.     [provider]  POTASSIUM PO Take 1 tablet by mouth daily.    [provider]  Prenatal Vit-Fe Fumarate-FA (PRENATAL PO) Take 1 tablet by mouth daily.    [provider]    Family History Family History  Problem Relation Age of Onset  . Psoriasis Brother   . Tuberculosis Mother   . Breast cancer Paternal Aunt [id number]    Social History Social History   Tobacco Use  . Smoking status: Never Smoker  . Smokeless tobacco: Never Used  Substance Use Topics  . Alcohol use: No    Alcohol/week: 0.0 standard drinks  . Drug use: No     Allergies   Fish allergy; Milk-related compounds; and Penicillins   Review of Systems Review of Systems  Constitutional: Negative for chills and fever.  Musculoskeletal: Positive for arthralgias, gait problem and joint swelling.  Skin: Negative for color change, rash and wound.  Allergic/Immunologic: Negative for immunocompromised state.  Neurological: Negative for weakness and numbness.  Psychiatric/Behavioral: Negative for confusion.  All other systems reviewed and are negative.    Physical Exam Updated Vital Signs There were no vitals taken for this visit.  Physical Exam  Constitutional: She is oriented to person, place, and time. She appears well-developed and well-nourished. No distress.  HENT:  Head: Normocephalic and atraumatic.  Cardiovascular: Intact distal pulses.  Pulmonary/Chest: Effort normal.  Musculoskeletal: She exhibits tenderness. She  exhibits no deformity.       Right knee: She exhibits no swelling and no effusion. No tenderness found.       Right ankle: She exhibits decreased range of motion and swelling. She exhibits no deformity and normal pulse. Tenderness. Lateral malleolus and medial malleolus tenderness found. No head of 5th metatarsal and no proximal fibula tenderness found.  Neurological: She is alert and oriented to person, place, and time. No sensory deficit.  Skin: Skin is warm and dry.  She is not diaphoretic. No erythema.  Psychiatric: She has a normal mood and affect. Her behavior is normal.  Nursing note and vitals reviewed.    ED Treatments / Results  Labs (all labs ordered are listed, but only abnormal results are displayed) Labs Reviewed - No data to display  EKG None  Radiology Dg Knee 2 Views Right  Result Date: 01/20/2018 CLINICAL DATA:  Fall at home.  Pain. EXAM: RIGHT KNEE - 1-2 VIEW COMPARISON:  None. FINDINGS: No evidence of fracture, dislocation, or joint effusion. No evidence of arthropathy or other focal bone abnormality. Soft tissues are unremarkable. IMPRESSION: Negative. Electronically Signed   By: Elsie Stain M.D.   On: 01/20/2018 17:25   Dg Ankle Complete Right  Result Date: 01/20/2018 CLINICAL DATA:  62 year old female fell down stairs and heard pop. Pain and swelling. Initial encounter. EXAM: RIGHT ANKLE - COMPLETE 3+ VIEW COMPARISON:  None. FINDINGS: Oblique slightly displaced fracture of the distal right fibula/lateral malleolus. Transverse slightly oblique fracture of the distal right tibia/superior medial malleolar region. This fracture appears to extend into the posterior aspect of the distal right tibia with slight separation of posterior tibial fracture fragments and minimal incongruity of the posterior articular surface. Bimalleolar region soft tissue swelling. Very small plantar spur. Spur at the level of the Achilles tendon insertion site. IMPRESSION: 1. Oblique slightly displaced fracture of the distal right fibula/lateral malleolus. 2. Transverse slightly oblique fracture of the distal right tibia/superior medial malleolar region. This fracture appears to extend into the posterior aspect of the distal right tibia with slight separation of posterior tibial fracture fragments and minimal incongruity of the posterior articular surface. 3. Bimalleolar region soft tissue swelling. Electronically Signed   By: Lacy Duverney M.D.   On: 01/20/2018  15:22    Procedures Procedures (including critical care time)  Medications Ordered in ED Medications - No data to display   Initial Impression / Assessment and Plan / ED Course  I have reviewed the triage vital signs and the nursing notes.  Pertinent labs & imaging results that were available during my care of the patient were reviewed by me and considered in my medical decision making (see chart for details).  Clinical Course as of Jan 21 1736  Mon Jan 20, 2018  6119 62 year old female with right ankle injury, no other injuries or concerns, NVI, pain and swelling to medial and lateral ankle, no pain at proximal fibula.  X-ray shows distal tibia fibula fracture. Mia, PA-C in fast track discussed case with Jamelle Rushing, PA-C with ortho, recommends x-ray of knee, if normal, she may place an short leg posterior splint, given crutches, referred to orthopedics for follow-up.  Chest x-ray results with patient and significant other, advised she is to be nonweightbearing with her splint, elevate and apply ice to help with the swelling, given prescription for Norco to help with her pain and contact orthopedics to schedule follow-up appointment.   [LM]    Clinical Course User Index [LM] Eulah Pont,  Gerome Apley, PA-C    Final Clinical Impressions(s) / ED Diagnoses   Final diagnoses:  Closed fracture of right tibia and fibula, initial encounter    ED Discharge Orders         Ordered    HYDROcodone-acetaminophen (NORCO/VICODIN) 5-325 MG tablet  Every 4 hours PRN     01/20/18 1733           Jeannie Fend, PA-C 01/20/18 1737    Margarita Grizzle, MD 01/21/18 1811

## 2018-01-20 NOTE — ED Triage Notes (Signed)
Pt in c/o right ankle pain after a fall at home, denies other injuries, states she missed a step on her stairs, swelling noted

## 2018-01-20 NOTE — Progress Notes (Signed)
Orthopedic Tech Progress Note Patient Details:  [facility] [birth year] [id number]  Ortho Devices Type of Ortho Device: Ace wrap, Short leg splint, Crutches Ortho Device/Splint Interventions: Application   Post Interventions Instructions Provided: Care of device   Saul FordyceJennifer C Shamarcus Hoheisel 01/20/2018, 6:10 PM

## 2018-01-20 NOTE — ED Notes (Signed)
Patient refusing knee xray, states it is not her knee that hurts, it is her ankle. Ankle xray already completed.

## 2018-01-20 NOTE — ED Notes (Signed)
Ortho at bedside at this time.

## 2018-01-20 NOTE — Discharge Instructions (Addendum)
Leave your splint on, keep it clean and dry. Elevate your ankle above the level of your heart, apply ice to the area for 20 minutes at a time. Use crutches, do not try to bear weight on the leg. Consult orthopedics tomorrow to schedule an appointment.  Return to ER for worsening or concerning symptoms. Take Norco as needed as prescribed for pain.

## 2018-01-27 ENCOUNTER — Encounter (HOSPITAL_BASED_OUTPATIENT_CLINIC_OR_DEPARTMENT_OTHER): Payer: Self-pay | Admitting: *Deleted

## 2018-01-27 ENCOUNTER — Other Ambulatory Visit (HOSPITAL_COMMUNITY): Payer: Self-pay | Admitting: Orthopedic Surgery

## 2018-01-27 ENCOUNTER — Other Ambulatory Visit: Payer: Self-pay

## 2018-01-30 ENCOUNTER — Ambulatory Visit (HOSPITAL_BASED_OUTPATIENT_CLINIC_OR_DEPARTMENT_OTHER): Payer: BC Managed Care – PPO | Admitting: Anesthesiology

## 2018-01-30 ENCOUNTER — Other Ambulatory Visit: Payer: Self-pay

## 2018-01-30 ENCOUNTER — Encounter (HOSPITAL_BASED_OUTPATIENT_CLINIC_OR_DEPARTMENT_OTHER)
Admission: RE | Disposition: A | Discharge status: Home / Self Care | Payer: Self-pay | Source: Ambulatory Visit | Attending: Orthopedic Surgery

## 2018-01-30 ENCOUNTER — Encounter (HOSPITAL_BASED_OUTPATIENT_CLINIC_OR_DEPARTMENT_OTHER): Payer: Self-pay | Admitting: Anesthesiology

## 2018-01-30 ENCOUNTER — Ambulatory Visit (HOSPITAL_BASED_OUTPATIENT_CLINIC_OR_DEPARTMENT_OTHER)
Admission: RE | Admit: 2018-01-30 | Discharge: 2018-01-30 | Disposition: A | Discharge status: Home / Self Care | Payer: BC Managed Care – PPO | Source: Ambulatory Visit | Attending: Orthopedic Surgery | Admitting: Orthopedic Surgery

## 2018-01-30 DIAGNOSIS — I10 Essential (primary) hypertension: Secondary | ICD-10-CM | POA: Insufficient documentation

## 2018-01-30 DIAGNOSIS — G4730 Sleep apnea, unspecified: Secondary | ICD-10-CM | POA: Diagnosis not present

## 2018-01-30 DIAGNOSIS — Z9884 Bariatric surgery status: Secondary | ICD-10-CM | POA: Insufficient documentation

## 2018-01-30 DIAGNOSIS — Z79899 Other long term (current) drug therapy: Secondary | ICD-10-CM | POA: Diagnosis not present

## 2018-01-30 DIAGNOSIS — S82851A Displaced trimalleolar fracture of right lower leg, initial encounter for closed fracture: Secondary | ICD-10-CM | POA: Diagnosis not present

## 2018-01-30 DIAGNOSIS — W010XXA Fall on same level from slipping, tripping and stumbling without subsequent striking against object, initial encounter: Secondary | ICD-10-CM | POA: Diagnosis not present

## 2018-01-30 DIAGNOSIS — M25571 Pain in right ankle and joints of right foot: Secondary | ICD-10-CM | POA: Diagnosis present

## 2018-01-30 DIAGNOSIS — Z7982 Long term (current) use of aspirin: Secondary | ICD-10-CM | POA: Insufficient documentation

## 2018-01-30 HISTORY — DX: Displaced bimalleolar fracture of unspecified lower leg, initial encounter for closed fracture: S82.843A

## 2018-01-30 HISTORY — DX: Prediabetes: R73.03

## 2018-01-30 HISTORY — DX: Sleep apnea, unspecified: G47.30

## 2018-01-30 HISTORY — PX: ORIF ANKLE FRACTURE: SHX5408

## 2018-01-30 LAB — POCT I-STAT, CHEM 8
BUN: 18 mg/dL (ref 8–23)
CALCIUM ION: 1.08 mmol/L — AB (ref 1.15–1.40)
CREATININE: 0.8 mg/dL (ref 0.44–1.00)
Chloride: 106 mmol/L (ref 98–111)
Glucose, Bld: 102 mg/dL — ABNORMAL HIGH (ref 70–99)
HCT: 42 % (ref 36.0–46.0)
HEMOGLOBIN: 14.3 g/dL (ref 12.0–15.0)
Potassium: 3.7 mmol/L (ref 3.5–5.1)
SODIUM: 141 mmol/L (ref 135–145)
TCO2: 26 mmol/L (ref 22–32)

## 2018-01-30 SURGERY — OPEN REDUCTION INTERNAL FIXATION (ORIF) ANKLE FRACTURE
Anesthesia: General | Site: Ankle | Laterality: Right

## 2018-01-30 MED ORDER — OXYCODONE HCL 5 MG PO TABS: 5.0000 mg | ORAL_TABLET | Freq: Four times a day (QID) | ORAL | 0 refills | Status: DC | PRN

## 2018-01-30 MED ORDER — ONDANSETRON HCL 4 MG/2ML IJ SOLN
INTRAMUSCULAR | Status: DC | PRN
  Administered 2018-01-30: 4 mg via INTRAVENOUS

## 2018-01-30 MED ORDER — SCOPOLAMINE 1 MG/3DAYS TD PT72: 1.0000 | MEDICATED_PATCH | Freq: Once | TRANSDERMAL | Status: DC | PRN

## 2018-01-30 MED ORDER — MIDAZOLAM HCL 2 MG/2ML IJ SOLN
INTRAMUSCULAR | Status: AC
  Filled 2018-01-30: qty 2

## 2018-01-30 MED ORDER — PROPOFOL 10 MG/ML IV BOLUS
INTRAVENOUS | Status: DC | PRN
  Administered 2018-01-30: 150 mg via INTRAVENOUS

## 2018-01-30 MED ORDER — LACTATED RINGERS IV SOLN
INTRAVENOUS | Status: DC
  Administered 2018-01-30: 14:00:00 via INTRAVENOUS

## 2018-01-30 MED ORDER — CLONIDINE HCL (ANALGESIA) 100 MCG/ML EP SOLN
EPIDURAL | Status: DC | PRN
  Administered 2018-01-30: 40 ug
  Administered 2018-01-30: 60 ug

## 2018-01-30 MED ORDER — OXYCODONE HCL 5 MG/5ML PO SOLN: 5.0000 mg | Freq: Once | ORAL | Status: DC | PRN

## 2018-01-30 MED ORDER — LIDOCAINE 2% (20 MG/ML) 5 ML SYRINGE
INTRAMUSCULAR | Status: AC
  Filled 2018-01-30: qty 5

## 2018-01-30 MED ORDER — FENTANYL CITRATE (PF) 100 MCG/2ML IJ SOLN
INTRAMUSCULAR | Status: AC
  Filled 2018-01-30: qty 2

## 2018-01-30 MED ORDER — OXYCODONE HCL 5 MG PO TABS: 5.0000 mg | ORAL_TABLET | Freq: Once | ORAL | Status: DC | PRN

## 2018-01-30 MED ORDER — MIDAZOLAM HCL 2 MG/2ML IJ SOLN
1.0000 mg | INTRAMUSCULAR | Status: DC | PRN
  Administered 2018-01-30: 2 mg via INTRAVENOUS

## 2018-01-30 MED ORDER — CHLORHEXIDINE GLUCONATE 4 % EX LIQD: 60.0000 mL | Freq: Once | CUTANEOUS | Status: DC

## 2018-01-30 MED ORDER — CEFAZOLIN SODIUM-DEXTROSE 2-4 GM/100ML-% IV SOLN
2.0000 g | INTRAVENOUS | Status: AC
  Administered 2018-01-30: 2 g via INTRAVENOUS

## 2018-01-30 MED ORDER — FENTANYL CITRATE (PF) 100 MCG/2ML IJ SOLN: 25.0000 ug | INTRAMUSCULAR | Status: DC | PRN

## 2018-01-30 MED ORDER — MEPERIDINE HCL 25 MG/ML IJ SOLN: 6.2500 mg | INTRAMUSCULAR | Status: DC | PRN

## 2018-01-30 MED ORDER — LIDOCAINE HCL (PF) 2 % IJ SOLN
INTRAMUSCULAR | Status: DC | PRN
  Administered 2018-01-30: 5 mL via INTRADERMAL
  Administered 2018-01-30: 8 mL via INTRADERMAL

## 2018-01-30 MED ORDER — DEXAMETHASONE SODIUM PHOSPHATE 10 MG/ML IJ SOLN
INTRAMUSCULAR | Status: AC
  Filled 2018-01-30: qty 1

## 2018-01-30 MED ORDER — LIDOCAINE HCL (CARDIAC) PF 100 MG/5ML IV SOSY
PREFILLED_SYRINGE | INTRAVENOUS | Status: DC | PRN
  Administered 2018-01-30: 30 mg via INTRAVENOUS

## 2018-01-30 MED ORDER — EPHEDRINE 5 MG/ML INJ
INTRAVENOUS | Status: AC
  Filled 2018-01-30: qty 10

## 2018-01-30 MED ORDER — FENTANYL CITRATE (PF) 100 MCG/2ML IJ SOLN
50.0000 ug | INTRAMUSCULAR | Status: DC | PRN
  Administered 2018-01-30: 50 ug via INTRAVENOUS

## 2018-01-30 MED ORDER — BUPIVACAINE HCL (PF) 0.5 % IJ SOLN
INTRAMUSCULAR | Status: DC | PRN
  Administered 2018-01-30: 15 mL
  Administered 2018-01-30: 25 mL

## 2018-01-30 MED ORDER — ONDANSETRON HCL 4 MG/2ML IJ SOLN
INTRAMUSCULAR | Status: AC
  Filled 2018-01-30: qty 2

## 2018-01-30 MED ORDER — DEXAMETHASONE SODIUM PHOSPHATE 4 MG/ML IJ SOLN
INTRAMUSCULAR | Status: DC | PRN
  Administered 2018-01-30: 10 mg via INTRAVENOUS

## 2018-01-30 MED ORDER — CEFAZOLIN SODIUM-DEXTROSE 2-4 GM/100ML-% IV SOLN
INTRAVENOUS | Status: AC
  Filled 2018-01-30: qty 100

## 2018-01-30 MED ORDER — PROMETHAZINE HCL 25 MG/ML IJ SOLN: 6.2500 mg | INTRAMUSCULAR | Status: DC | PRN

## 2018-01-30 MED ORDER — SODIUM CHLORIDE 0.9 % IV SOLN: INTRAVENOUS | Status: DC

## 2018-01-30 MED ORDER — MIDAZOLAM HCL 5 MG/5ML IJ SOLN
INTRAMUSCULAR | Status: DC | PRN
  Administered 2018-01-30 (×2): 1 mg via INTRAVENOUS

## 2018-01-30 MED ORDER — EPHEDRINE SULFATE 50 MG/ML IJ SOLN
INTRAMUSCULAR | Status: DC | PRN
  Administered 2018-01-30 (×2): 10 mg via INTRAVENOUS

## 2018-01-30 SURGICAL SUPPLY — 79 items
BANDAGE ESMARK 6X9 LF (GAUZE/BANDAGES/DRESSINGS) ×1 IMPLANT
BIT DRILL 2.5X2.75 QC CALB (BIT) ×1 IMPLANT
BIT DRILL 2.9 CANN QC NONSTRL (BIT) ×1 IMPLANT
BIT DRILL 3.5X5.5 QC CALB (BIT) ×1 IMPLANT
BLADE SURG 15 STRL LF DISP TIS (BLADE) ×2 IMPLANT
BLADE SURG 15 STRL SS (BLADE) ×4
BNDG CMPR 9X4 STRL LF SNTH (GAUZE/BANDAGES/DRESSINGS)
BNDG CMPR 9X6 STRL LF SNTH (GAUZE/BANDAGES/DRESSINGS)
BNDG COHESIVE 4X5 TAN STRL (GAUZE/BANDAGES/DRESSINGS) ×2 IMPLANT
BNDG COHESIVE 6X5 TAN STRL LF (GAUZE/BANDAGES/DRESSINGS) ×2 IMPLANT
BNDG ESMARK 4X9 LF (GAUZE/BANDAGES/DRESSINGS) IMPLANT
BNDG ESMARK 6X9 LF (GAUZE/BANDAGES/DRESSINGS)
CANISTER SUCT 1200ML W/VALVE (MISCELLANEOUS) ×2 IMPLANT
CHLORAPREP W/TINT 26ML (MISCELLANEOUS) ×2 IMPLANT
COVER BACK TABLE 60X90IN (DRAPES) ×2 IMPLANT
CUFF TOURNIQUET SINGLE 34IN LL (TOURNIQUET CUFF) ×1 IMPLANT
DECANTER SPIKE VIAL GLASS SM (MISCELLANEOUS) IMPLANT
DRAPE EXTREMITY T 121X128X90 (DRAPE) ×2 IMPLANT
DRAPE OEC MINIVIEW 54X84 (DRAPES) ×2 IMPLANT
DRAPE U-SHAPE 47X51 STRL (DRAPES) ×2 IMPLANT
DRSG MEPITEL 4X7.2 (GAUZE/BANDAGES/DRESSINGS) ×2 IMPLANT
DRSG PAD ABDOMINAL 8X10 ST (GAUZE/BANDAGES/DRESSINGS) ×4 IMPLANT
ELECT REM PT RETURN 9FT ADLT (ELECTROSURGICAL) ×2
ELECTRODE REM PT RTRN 9FT ADLT (ELECTROSURGICAL) ×1 IMPLANT
GAUZE SPONGE 4X4 12PLY STRL (GAUZE/BANDAGES/DRESSINGS) ×2 IMPLANT
GLOVE BIO SURGEON STRL SZ8 (GLOVE) ×2 IMPLANT
GLOVE BIOGEL PI IND STRL 7.0 (GLOVE) IMPLANT
GLOVE BIOGEL PI IND STRL 8 (GLOVE) ×2 IMPLANT
GLOVE BIOGEL PI INDICATOR 7.0 (GLOVE) ×2
GLOVE BIOGEL PI INDICATOR 8 (GLOVE) ×1
GLOVE ECLIPSE 6.5 STRL STRAW (GLOVE) ×1 IMPLANT
GLOVE ECLIPSE 8.0 STRL XLNG CF (GLOVE) ×1 IMPLANT
GOWN STRL REUS W/ TWL LRG LVL3 (GOWN DISPOSABLE) ×1 IMPLANT
GOWN STRL REUS W/ TWL XL LVL3 (GOWN DISPOSABLE) ×2 IMPLANT
GOWN STRL REUS W/TWL LRG LVL3 (GOWN DISPOSABLE) ×2
GOWN STRL REUS W/TWL XL LVL3 (GOWN DISPOSABLE) ×2
K-WIRE ACE 1.6X6 (WIRE) ×4
KWIRE ACE 1.6X6 (WIRE) IMPLANT
NEEDLE HYPO 22GX1.5 SAFETY (NEEDLE) IMPLANT
NS IRRIG 1000ML POUR BTL (IV SOLUTION) ×2 IMPLANT
PACK BASIN DAY SURGERY FS (CUSTOM PROCEDURE TRAY) ×2 IMPLANT
PAD CAST 4YDX4 CTTN HI CHSV (CAST SUPPLIES) ×1 IMPLANT
PADDING CAST ABS 4INX4YD NS (CAST SUPPLIES)
PADDING CAST ABS COTTON 4X4 ST (CAST SUPPLIES) IMPLANT
PADDING CAST COTTON 4X4 STRL (CAST SUPPLIES) ×2
PADDING CAST COTTON 6X4 STRL (CAST SUPPLIES) ×2 IMPLANT
PENCIL BUTTON HOLSTER BLD 10FT (ELECTRODE) ×2 IMPLANT
PLATE ACE 100DEG 6HOLE (Plate) ×1 IMPLANT
SANITIZER HAND PURELL 535ML FO (MISCELLANEOUS) ×2 IMPLANT
SCREW ACE CAN 4.0 40M (Screw) ×1 IMPLANT
SCREW ACE CAN 4.0 46M (Screw) ×1 IMPLANT
SCREW CORTICAL 3.5MM  12MM (Screw) ×1 IMPLANT
SCREW CORTICAL 3.5MM  16MM (Screw) ×2 IMPLANT
SCREW CORTICAL 3.5MM  20MM (Screw) ×2 IMPLANT
SCREW CORTICAL 3.5MM 12MM (Screw) IMPLANT
SCREW CORTICAL 3.5MM 16MM (Screw) IMPLANT
SCREW CORTICAL 3.5MM 18MM (Screw) ×2 IMPLANT
SCREW CORTICAL 3.5MM 20MM (Screw) IMPLANT
SHEET MEDIUM DRAPE 40X70 STRL (DRAPES) ×2 IMPLANT
SLEEVE SCD COMPRESS KNEE MED (MISCELLANEOUS) ×2 IMPLANT
SPLINT FAST PLASTER 5X30 (CAST SUPPLIES) ×20
SPLINT PLASTER CAST FAST 5X30 (CAST SUPPLIES) ×20 IMPLANT
SPONGE LAP 18X18 RF (DISPOSABLE) ×2 IMPLANT
STOCKINETTE 6  STRL (DRAPES) ×1
STOCKINETTE 6 STRL (DRAPES) ×1 IMPLANT
SUCTION FRAZIER HANDLE 10FR (MISCELLANEOUS) ×1
SUCTION TUBE FRAZIER 10FR DISP (MISCELLANEOUS) ×1 IMPLANT
SUT ETHILON 3 0 PS 1 (SUTURE) ×2 IMPLANT
SUT FIBERWIRE #2 38 T-5 BLUE (SUTURE)
SUT MNCRL AB 3-0 PS2 18 (SUTURE) ×1 IMPLANT
SUT VIC AB 0 SH 27 (SUTURE) IMPLANT
SUT VIC AB 2-0 SH 27 (SUTURE) ×2
SUT VIC AB 2-0 SH 27XBRD (SUTURE) ×1 IMPLANT
SUTURE FIBERWR #2 38 T-5 BLUE (SUTURE) IMPLANT
SYR BULB 3OZ (MISCELLANEOUS) ×2 IMPLANT
SYR CONTROL 10ML LL (SYRINGE) IMPLANT
TOWEL GREEN STERILE FF (TOWEL DISPOSABLE) ×4 IMPLANT
TUBE CONNECTING 20X1/4 (TUBING) ×2 IMPLANT
UNDERPAD 30X30 (UNDERPADS AND DIAPERS) ×2 IMPLANT

## 2018-01-30 NOTE — Discharge Instructions (Addendum)
Teresa Hewitt, MD °[facility] ° °Please read the following information regarding your care after surgery. ° °Medications  °You only need a prescription for the narcotic pain medicine (ex. oxycodone, Percocet, Norco).  All of the other medicines listed below are available over the counter. °X Aleve 2 pills twice a day for the first 3 days after surgery. °X acetominophen (Tylenol) 650 mg every 4-6 hours as you need for minor to moderate pain °X oxycodone as prescribed for severe pain ° °Narcotic pain medicine (ex. oxycodone, Percocet, Vicodin) will cause constipation.  To prevent this problem, take the following medicines while you are taking any pain medicine. °X docusate sodium (Colace) 100 mg twice a day X senna (Senokot) 2 tablets twice a day ° °X To help prevent blood clots, take a baby aspirin (81 mg) twice a day for two weeks after surgery.  You should also get up every hour while you are awake to move around.   ° °Weight Bearing °X Do not bear any weight on the operated leg or foot. ° °Cast / Splint / Dressing °X Keep your splint, cast or dressing clean and dry.  Don’t put anything (coat hanger, pencil, etc) down inside of it.  If it gets damp, use a hair dryer on the cool setting to dry it.  If it gets soaked, call the office to schedule an appointment for a cast change. ° ° °After your dressing, cast or splint is removed; you may shower, but do not soak or scrub the wound.  Allow the water to run over it, and then gently pat it dry. ° °Swelling °It is normal for you to have swelling where you had surgery.  To reduce swelling and pain, keep your toes above your nose for at least 3 days after surgery.  It may be necessary to keep your foot or leg elevated for several weeks.  If it hurts, it should be elevated. ° °Follow Up °Call my office at [phone] when you are discharged from the hospital or surgery center to schedule an appointment to be seen two weeks after surgery. ° °Call my office at  [phone] if you develop a fever >101.5° F, nausea, vomiting, bleeding from the surgical site or severe pain.   ° ° °Post Anesthesia Home Care Instructions ° °Activity: °Get plenty of rest for the remainder of the day. A responsible individual must stay with you for 24 hours following the procedure.  °For the next 24 hours, DO NOT: °-Drive a car °-Operate machinery °-Drink alcoholic beverages °-Take any medication unless instructed by your physician °-Make any legal decisions or sign important papers. ° °Meals: °Start with liquid foods such as gelatin or soup. Progress to regular foods as tolerated. Avoid greasy, spicy, heavy foods. If nausea and/or vomiting occur, drink only clear liquids until the nausea and/or vomiting subsides. Call your physician if vomiting continues. ° °Special Instructions/Symptoms: °Your throat may feel dry or sore from the anesthesia or the breathing tube placed in your throat during surgery. If this causes discomfort, gargle with warm salt water. The discomfort should disappear within 24 hours. ° °If you had a scopolamine patch placed behind your ear for the management of post- operative nausea and/or vomiting: ° °1. The medication in the patch is effective for 72 hours, after which it should be removed.  Wrap patch in a tissue and discard in the trash. Wash hands thoroughly with soap and water. °2. You may remove the patch earlier than 72 hours if you   experience unpleasant side effects which may include dry mouth, dizziness or visual disturbances. °3. Avoid touching the patch. Wash your hands with soap and water after contact with the patch. °  °Regional Anesthesia Blocks ° °1. Numbness or the inability to move the "blocked" extremity may last from 3-48 hours after placement. The length of time depends on the medication injected and your individual response to the medication. If the numbness is not going away after 48 hours, call your surgeon. ° °2. The extremity that is blocked will  need to be protected until the numbness is gone and the  Strength has returned. Because you cannot feel it, you will need to take extra care to avoid injury. Because it may be weak, you may have difficulty moving it or using it. You may not know what position it is in without looking at it while the block is in effect. ° °3. For blocks in the legs and feet, returning to weight bearing and walking needs to be done carefully. You will need to wait until the numbness is entirely gone and the strength has returned. You should be able to move your leg and foot normally before you try and bear weight or walk. You will need someone to be with you when you first try to ensure you do not fall and possibly risk injury. ° °4. Bruising and tenderness at the needle site are common side effects and will resolve in a few days. ° °5. Persistent numbness or new problems with movement should be communicated to the surgeon or the [facility] ([phone])/ [facility] ([phone]). °

## 2018-01-30 NOTE — Anesthesia Procedure Notes (Signed)
Anesthesia Regional Block: Popliteal block   Pre-Anesthetic Checklist: ,, timeout performed, Correct Patient, Correct Site, Correct Laterality, Correct Procedure, Correct Position, site marked, Risks and benefits discussed,  Surgical consent,  Pre-op evaluation,  At surgeon's request and post-op pain management  Laterality: Right  Prep: chloraprep       Needles:  Injection technique: Single-shot  Needle Type: Stimiplex     Needle Length: 10cm  Needle Gauge: 21     Additional Needles:   Procedures:,,,, ultrasound used (permanent image in chart),,,,  Motor weakness within 5 minutes.  Narrative:  Start time: 01/30/2018 1:58 PM End time: 01/30/2018 2:03 PM Injection made incrementally with aspirations every 5 mL.  Performed by: Personally  Anesthesiologist: Lewie Loron, MD  Additional Notes: Nerve located and needle positioned with direct ultrasound guidance. Good perineural spread. Patient tolerated well.

## 2018-01-30 NOTE — Anesthesia Procedure Notes (Signed)
Procedure Name: LMA Insertion Date/Time: 01/30/2018 2:27 PM Performed by: [place] Desanctis, CRNA Pre-anesthesia Checklist: Patient identified, Emergency Drugs available, Suction available, Patient being monitored and Timeout performed Patient Re-evaluated:Patient Re-evaluated prior to induction Oxygen Delivery Method: Circle system utilized Preoxygenation: Pre-oxygenation with 100% oxygen Induction Type: IV induction Ventilation: Mask ventilation without difficulty LMA: LMA inserted LMA Size: 4.0 Number of attempts: 1 Airway Equipment and Method: Bite block Placement Confirmation: positive ETCO2 Tube secured with: Tape Dental Injury: Teeth and Oropharynx as per pre-operative assessment

## 2018-01-30 NOTE — Anesthesia Procedure Notes (Signed)
Anesthesia Regional Block: Adductor canal block   Pre-Anesthetic Checklist: ,, timeout performed, Correct Patient, Correct Site, Correct Laterality, Correct Procedure, Correct Position, site marked, Risks and benefits discussed,  Surgical consent,  Pre-op evaluation,  At surgeon's request and post-op pain management  Laterality: Right  Prep: chloraprep       Needles:  Injection technique: Single-shot  Needle Type: Stimiplex     Needle Length: 9cm  Needle Gauge: 21     Additional Needles:   Procedures:,,,, ultrasound used (permanent image in chart),,,,  Narrative:  Start time: 01/30/2018 1:55 PM End time: 01/30/2018 1:57 PM Injection made incrementally with aspirations every 5 mL.  Performed by: Personally  Anesthesiologist: Lewie Loron, MD  Additional Notes: BP cuff, EKG monitors applied. Sedation begun. Artery and nerve location verified with U/S and anesthetic injected incrementally, slowly, and after negative aspirations under direct u/s guidance. Good fascial /perineural spread. Tolerated well.

## 2018-01-30 NOTE — Anesthesia Postprocedure Evaluation (Signed)
Anesthesia Post Note  Patient: Teresa Newton  Procedure(s) Performed: OPEN REDUCTION INTERNAL FIXATION (ORIF) right ankle trimalleolar fracture (Right Ankle)     Patient location during evaluation: PACU Anesthesia Type: General Level of consciousness: sedated and patient cooperative Pain management: pain level controlled Vital Signs Assessment: post-procedure vital signs reviewed and stable Respiratory status: spontaneous breathing Cardiovascular status: stable Anesthetic complications: no    Last Vitals:  Vitals:   01/30/18 1600 01/30/18 1615  BP: (!) 121/54 (!) 122/59  Pulse: 65 65  Resp: 15 16  Temp:  (!) 36.4 C  SpO2: 98% 99%    Last Pain:  Vitals:   01/30/18 1615  TempSrc:   PainSc: 0-No pain          RLE Sensation: Decreased;Numbness (01/30/18 1615)      Lewie Loron

## 2018-01-30 NOTE — Op Note (Signed)
01/30/2018  3:33 PM  PATIENT:  Teresa Newton  62 y.o. female  PRE-OPERATIVE DIAGNOSIS:  Right ankle trimalleolar fracture  POST-OPERATIVE DIAGNOSIS:  same  Procedure(s): 1.  Open treatment of right ankle trimalleolar fracture with internal fixation without fixation of the posterior lip 2.  Stress examination of the right ankle under fluoroscopy 3.  AP, mortise and lateral radiographs of the right ankle  SURGEON:  [name], MD  ASSISTANT: None  ANESTHESIA:   General, regional  EBL:  minimal   TOURNIQUET:   Total Tourniquet Time Documented: Thigh (Right) - 39 minutes Total: Thigh (Right) - 39 minutes  COMPLICATIONS:  None apparent  DISPOSITION:  Extubated, awake and stable to recovery.  INDICATION FOR PROCEDURE: The patient is a 62 year old female who fell on stairs just over a week ago injuring her right ankle.  X-rays in the emergency room revealed a displaced trimalleolar fracture.  She presents now for operative treatment of this displaced and unstable injury.  The risks and benefits of the alternative treatment options have been discussed in detail.  The patient wishes to proceed with surgery and specifically understands risks of bleeding, infection, nerve damage, blood clots, need for additional surgery, amputation and death.  PROCEDURE IN DETAIL:  After pre operative consent was obtained, and the correct operative site was identified, the patient was brought to the operating room and placed supine on the OR table.  Anesthesia was administered.  Pre-operative antibiotics were administered.  A surgical timeout was taken.  The right lower extremity was prepped and draped in standard sterile fashion with a tourniquet around the thigh.  The extremity was elevated and the tourniquet was inflated to 250 mmHg.  A longitudinal incision was made over the lateral malleolus.  Dissection was carried down through the subcutaneous tissues to the level of the fracture.  Fracture was  cleaned of all hematoma and irrigated.  The fracture was reduced and held with a lobster claw clamp.  A 3.5 mm fully threaded lag screw was inserted from posterior to anterior across the fracture site.  A 6-hole one third tubular plate from the [employer] small frag set was contoured to fit the lateral malleolus.  It was secured proximally with 3 bicortical screws and distally with 3 unicortical screws.  AP, mortise and lateral radiographs confirmed appropriate reduction of the fibula and appropriate position and length of all hardware.  Attention was turned to the medial ankle where the medial malleolus fracture was identified.  A K wire was inserted percutaneously at the tip of the fibula and across the fracture site.  A second K wire was inserted just posterior to the first.  AP and lateral radiographs confirmed appropriate position of both K wires.  Stab incisions were made at the wires.  Both wires were overdrilled.  4 mm cannulated screws were inserted across the fracture site.  Both were noted to have adequate purchase.  K wires were removed.  AP, mortise and lateral radiographs confirmed appropriate reduction of the medial malleolus fracture in appropriate position and length of both screws.  Stress examination was then performed with no evidence of syndesmosis instability.  The wounds were irrigated copiously.  Deep subcutaneous tissues were approximated with 2-0 Vicryl.  Skin incision was closed with 3-0 nylon.  The medial incisions were closed with nylon as well.  Sterile dressings were applied followed by a well-padded short leg splint.  The tourniquet was released after application of the dressings.  The patient was awakened from anesthesia and  transported to the recovery room in stable condition.  FOLLOW UP PLAN: Nonweightbearing on the right lower extremity.  Follow-up with me in the office in 2 weeks for suture removal and conversion to a short leg cast.  Plan 6 weeks nonweightbearing.   Aspirin 81 mg p.o. twice daily for DVT prophylaxis.   RADIOGRAPHS: AP, mortise and lateral radiographs of the right ankle are obtained intraoperatively.  These show interval reduction and fixation of the trimalleolar ankle fracture.  Hardware is appropriately positioned and of the appropriate lengths.

## 2018-01-30 NOTE — Transfer of Care (Signed)
Immediate Anesthesia Transfer of Care Note  Patient: Teresa Newton  Procedure(s) Performed: OPEN REDUCTION INTERNAL FIXATION (ORIF) right ankle trimalleolar fracture (Right Ankle)  Patient Location: PACU  Anesthesia Type:General and Regional  Level of Consciousness: awake and sedated  Airway & Oxygen Therapy: Patient Spontanous Breathing and Patient connected to face mask oxygen  Post-op Assessment: Report given to RN and Post -op Vital signs reviewed and stable  Post vital signs: Reviewed and stable  Last Vitals:  Vitals Value Taken Time  BP 112/49 01/30/2018  3:26 PM  Temp    Pulse 73 01/30/2018  3:28 PM  Resp 18 01/30/2018  3:28 PM  SpO2 100 % 01/30/2018  3:28 PM  Vitals shown include unvalidated device data.  Last Pain:  Vitals:   01/30/18 1331  TempSrc: Oral  PainSc: 0-No pain         Complications: No apparent anesthesia complications

## 2018-01-30 NOTE — Anesthesia Preprocedure Evaluation (Signed)
Anesthesia Evaluation  Patient identified by MRN, date of birth, ID band Patient awake    Reviewed: Allergy & Precautions, NPO status , Patient's Chart, lab work & pertinent test results  Airway Mallampati: II  TM Distance: >3 FB Neck ROM: Full    Dental no notable dental hx.    Pulmonary neg pulmonary ROS, sleep apnea ,    Pulmonary exam normal breath sounds clear to auscultation       Cardiovascular hypertension, Pt. on medications Normal cardiovascular exam Rhythm:Regular Rate:Normal     Neuro/Psych negative neurological ROS  negative psych ROS   GI/Hepatic negative GI ROS, Neg liver ROS,   Endo/Other  negative endocrine ROS  Renal/GU negative Renal ROS     Musculoskeletal negative musculoskeletal ROS (+)   Abdominal   Peds  Hematology negative hematology ROS (+)   Anesthesia Other Findings   Reproductive/Obstetrics negative OB ROS                             Anesthesia Physical Anesthesia Plan  ASA: II  Anesthesia Plan: General   Post-op Pain Management: GA combined w/ Regional for post-op pain   Induction: Intravenous  PONV Risk Score and Plan: 3 and Ondansetron, Dexamethasone and Midazolam  Airway Management Planned: LMA  Additional Equipment:   Intra-op Plan:   Post-operative Plan: Extubation in OR  Informed Consent: I have reviewed the patients History and Physical, chart, labs and discussed the procedure including the risks, benefits and alternatives for the proposed anesthesia with the patient or authorized representative who has indicated his/her understanding and acceptance.   Dental advisory given  Plan Discussed with: CRNA  Anesthesia Plan Comments:         Anesthesia Quick Evaluation

## 2018-01-30 NOTE — H&P (Signed)
Teresa Newton is an 62 y.o. female.   Chief Complaint: Right ankle pain HPI: The patient is a 89 female who slipped at home on the stairs and injured her right ankle.  X-rays revealed a bimal fracture.  She presents now for operative treatment of this displaced and unstable fracture.  Past Medical History:  Diagnosis Date  . Bimalleolar ankle fracture    right  . Hypertension   . LTBI (latent tuberculosis infection)   . Plaque psoriasis   . Pre-diabetes   . Sleep apnea    gastric bypass, lost 70lbs and no longer needs CPAP    Past Surgical History:  Procedure Laterality Date  . COLONOSCOPY    . FOOT SURGERY Right   . GASTRIC BYPASS    . TUBAL LIGATION      Family History  Problem Relation Age of Onset  . Psoriasis Brother   . Tuberculosis Mother   . Breast cancer Paternal Aunt 52   Social History:  reports that she has never smoked. She has never used smokeless tobacco. She reports that she does not drink alcohol or use drugs.  Allergies:  Allergies  Allergen Reactions  . Fish Allergy Anaphylaxis  . Milk-Related Compounds Anaphylaxis    Goat Milk ONLY   . Penicillins Hives    Medications Prior to Admission  Medication Sig Dispense Refill  . aspirin 81 MG tablet Take 81 mg by mouth daily.    Marland Kitchen CALCIUM PO Take 1 tablet by mouth daily.    . Cyanocobalamin (B-12 PO) Take 1 tablet by mouth daily.    . hydrochlorothiazide (MICROZIDE) 12.5 MG capsule Take 12.5 mg by mouth daily.    Marland Kitchen lisinopril (PRINIVIL,ZESTRIL) 20 MG tablet Take 20 mg by mouth daily.    . Multiple Vitamin (MULTI VITAMIN PO) Take 1 tablet by mouth daily.    . Omega-3 Fatty Acids (OMEGA 3 PO) Take 1 capsule by mouth daily.    Marland Kitchen POTASSIUM PO Take 1 tablet by mouth daily.    . Prenatal Vit-Fe Fumarate-FA (PRENATAL PO) Take 1 tablet by mouth daily.    Marland Kitchen EPINEPHrine 0.3 mg/0.3 mL IJ SOAJ injection Inject 0.3 mg into the muscle once.    Marland Kitchen HYDROcodone-acetaminophen (NORCO/VICODIN) 5-325 MG tablet Take 1  tablet by mouth every 4 (four) hours as needed for up to 12 doses. 12 tablet 0    Results for orders placed or performed during the hospital encounter of 01/30/18 (from the past 48 hour(s))  I-STAT, chem 8     Status: Abnormal   Collection Time: 01/30/18  1:30 PM  Result Value Ref Range   Sodium 141 135 - 145 mmol/L   Potassium 3.7 3.5 - 5.1 mmol/L   Chloride 106 98 - 111 mmol/L   BUN 18 8 - 23 mg/dL   Creatinine, Ser 1.61 0.44 - 1.00 mg/dL   Glucose, Bld 096 (H) 70 - 99 mg/dL   Calcium, Ion 0.45 (L) 1.15 - 1.40 mmol/L   TCO2 26 22 - 32 mmol/L   Hemoglobin 14.3 12.0 - 15.0 g/dL   HCT 40.9 81.1 - 91.4 %   No results found.  ROS no recent fever, chills, nausea, vomiting or changes in her appetite  Blood pressure 102/60, pulse (!) 59, resp. rate 15, height 5\' 3"  (1.6 m), weight 90 kg, SpO2 100 %. Physical Exam  Well-nourished well-developed woman in no apparent distress.  Alert and oriented x4.  Mood and affect are normal.  Extraocular motions are intact.  Respirations are unlabored.  Gait is nonweightbearing on the right lower externally.  Right ankle has healthy skin.  Pulses are palpable.  No lymphadenopathy.  Sensibility to light touch is intact on the foot dorsally and plantarly.  5 out of 5 strength in plantar flexion and dorsiflexion of the toes.  Assessment/Plan Right ankle bimalleolar fracture -to the operating room for open treatment of this bimalleolar ankle fracture with internal fixation.  The risks and benefits of the alternative treatment options have been discussed in detail.  The patient wishes to proceed with surgery and specifically understands risks of bleeding, infection, nerve damage, blood clots, need for additional surgery, amputation and death.   [name], MD February 17, 2018, 2:11 PM

## 2018-01-30 NOTE — Progress Notes (Signed)
Assisted Dr. Germeroth with right, ultrasound guided, popliteal/saphenous block. Side rails up, monitors on throughout procedure. See vital signs in flow sheet. Tolerated Procedure well. 

## 2018-01-31 ENCOUNTER — Encounter (HOSPITAL_BASED_OUTPATIENT_CLINIC_OR_DEPARTMENT_OTHER): Payer: Self-pay | Admitting: Orthopedic Surgery

## 2018-03-06 ENCOUNTER — Other Ambulatory Visit: Payer: Self-pay | Admitting: Internal Medicine

## 2018-03-08 ENCOUNTER — Encounter: Payer: Self-pay | Admitting: Internal Medicine

## 2018-03-08 DIAGNOSIS — I10 Essential (primary) hypertension: Secondary | ICD-10-CM

## 2018-03-08 DIAGNOSIS — Z6836 Body mass index (BMI) 36.0-36.9, adult: Secondary | ICD-10-CM

## 2018-03-08 DIAGNOSIS — E1169 Type 2 diabetes mellitus with other specified complication: Secondary | ICD-10-CM

## 2018-03-10 ENCOUNTER — Ambulatory Visit: Payer: BC Managed Care – PPO | Admitting: Internal Medicine

## 2018-03-10 ENCOUNTER — Encounter: Payer: Self-pay | Admitting: Internal Medicine

## 2018-03-10 VITALS — BP 126/84 | HR 68 | Temp 97.6°F

## 2018-03-10 DIAGNOSIS — Z6836 Body mass index (BMI) 36.0-36.9, adult: Secondary | ICD-10-CM | POA: Diagnosis not present

## 2018-03-10 DIAGNOSIS — E1169 Type 2 diabetes mellitus with other specified complication: Secondary | ICD-10-CM | POA: Diagnosis not present

## 2018-03-10 DIAGNOSIS — I10 Essential (primary) hypertension: Secondary | ICD-10-CM

## 2018-03-10 DIAGNOSIS — E669 Obesity, unspecified: Secondary | ICD-10-CM

## 2018-03-10 DIAGNOSIS — E1159 Type 2 diabetes mellitus with other circulatory complications: Principal | ICD-10-CM

## 2018-03-10 DIAGNOSIS — I152 Hypertension secondary to endocrine disorders: Secondary | ICD-10-CM

## 2018-03-10 DIAGNOSIS — Z9884 Bariatric surgery status: Secondary | ICD-10-CM

## 2018-03-10 NOTE — Progress Notes (Signed)
Subjective:     Patient ID: Teresa Newton , female    DOB: May 31, 1955 , 62 y.o.   MRN: [id number]   Chief Complaint  Patient presents with  . Diabetes  . Hypertension    HPI  Diabetes  She presents for her follow-up diabetic visit. She has type 2 diabetes mellitus. There are no hypoglycemic associated symptoms. Pertinent negatives for diabetes include no chest pain, no fatigue, no foot paresthesias, no polydipsia and no polyphagia. There are no hypoglycemic complications.  Hypertension  This is a chronic problem. The current episode started more than 1 year ago. The problem is unchanged. The problem is controlled. Pertinent negatives include no chest pain.   She reports compliance with medications.   Past Medical History:  Diagnosis Date  . Bimalleolar ankle fracture    right  . Hypertension   . LTBI (latent tuberculosis infection)   . Plaque psoriasis   . Pre-diabetes   . Sleep apnea    gastric bypass, lost 70lbs and no longer needs CPAP     Family History  Problem Relation Age of Onset  . Psoriasis Brother   . Tuberculosis Mother   . Heart Problems Mother   . Dementia Mother   . Stroke Mother   . Breast cancer Paternal Aunt 17  . Ovarian cancer Sister      Current Outpatient Medications:  .  aspirin 81 MG tablet, Take 81 mg by mouth daily., Disp: , Rfl:  .  CALCIUM PO, Take 1 tablet by mouth daily., Disp: , Rfl:  .  Cyanocobalamin (B-12 PO), Take 1 tablet by mouth daily., Disp: , Rfl:  .  EPINEPHrine 0.3 mg/0.3 mL IJ SOAJ injection, Inject 0.3 mg into the muscle once., Disp: , Rfl:  .  hydrochlorothiazide (MICROZIDE) 12.5 MG capsule, Take 12.5 mg by mouth daily., Disp: , Rfl:  .  lisinopril (PRINIVIL,ZESTRIL) 10 MG tablet, Take 5 mg by mouth daily. , Disp: , Rfl:  .  Multiple Vitamin (MULTI VITAMIN PO), Take 1 tablet by mouth daily., Disp: , Rfl:  .  Omega-3 Fatty Acids (OMEGA 3 PO), Take 1 capsule by mouth daily., Disp: , Rfl:  .  POTASSIUM PO, Take 1 tablet  by mouth daily., Disp: , Rfl:  .  Prenatal Vit-Fe Fumarate-FA (PRENATAL PO), Take 1 tablet by mouth daily., Disp: , Rfl:  .  rosuvastatin (CRESTOR) 5 MG tablet, TAKE 1 TABLET BY MOUTH DAILY, Disp: 30 tablet, Rfl: 1   Allergies  Allergen Reactions  . Fish Allergy Anaphylaxis  . Milk-Related Compounds Anaphylaxis    Goat Milk ONLY   . Penicillins Hives     Review of Systems  Constitutional: Negative.  Negative for fatigue.  Respiratory: Negative.   Cardiovascular: Negative.  Negative for chest pain.  Gastrointestinal: Negative.   Endocrine: Negative for polydipsia and polyphagia.  Genitourinary: Negative.   Musculoskeletal: Positive for arthralgias (SHE C/O R ANKLE PAIN. S/P SURGERY FOR FX. ).  Neurological: Negative.   Psychiatric/Behavioral: Negative.      Today's Vitals   03/10/18 1148  BP: 126/84  Pulse: 68  Temp: 97.6 F (36.4 C)  TempSrc: Oral   There is no height or weight on file to calculate BMI.   Objective:  Physical Exam  Constitutional: She appears well-developed and well-nourished.  HENT:  Head: Normocephalic and atraumatic.  Cardiovascular: Normal rate, regular rhythm and normal heart sounds.  Pulmonary/Chest: Effort normal and breath sounds normal.  Musculoskeletal:  R ANKLE IN CAST. SHE IS NON-WEIGHTBEARING  Neurological: She is alert.  Skin: Skin is warm and dry.  Psychiatric: She has a normal mood and affect.  Nursing note and vitals reviewed.       Assessment And Plan:     1. Obesity, diabetes, and hypertension syndrome ([place])  She is encouraged to incorporate more exercise into her daily routine once cleared by Ortho. She will rto in 4 months for re-evaluation.   - CMP14+EGFR - Hemoglobin A1c - Lipid Profile  2. Essential hypertension, benign  Well controlled. She will continue with current meds. She is encouraged to avoid adding salt to her foods.   - CMP14+EGFR - Hemoglobin A1c - Lipid Profile  3. Class 2 severe obesity due to  excess calories with serious comorbidity and body mass index (BMI) of 36.0 to 36.9 in adult [facility])  She is s/p bariatric surgery as per [facility]. Unfortunately, she has not achieved her ideal body weight.  Importance of regular exercise was discussed with the patient. She is encouraged to avoid sugary beverages and processed foods. I offered to refer her to [facility] Weight program. She wants to think about this further.   [name], MD

## 2018-03-11 LAB — LIPID PANEL
CHOLESTEROL TOTAL: 154 mg/dL (ref 100–199)
Chol/HDL Ratio: 2.2 ratio (ref 0.0–4.4)
HDL: 70 mg/dL (ref >39)
LDL Calculated: 67 mg/dL (ref 0–99)
TRIGLYCERIDES: 87 mg/dL (ref 0–149)
VLDL CHOLESTEROL CAL: 17 mg/dL (ref 5–40)

## 2018-03-11 LAB — CMP14+EGFR
ALK PHOS: 88 IU/L (ref 39–117)
ALT: 75 IU/L — ABNORMAL HIGH (ref 0–32)
AST: 52 IU/L — ABNORMAL HIGH (ref 0–40)
Albumin/Globulin Ratio: 1.5 (ref 1.2–2.2)
Albumin: 4.2 g/dL (ref 3.6–4.8)
BUN/Creatinine Ratio: 23 (ref 12–28)
BUN: 20 mg/dL (ref 8–27)
Bilirubin Total: 0.5 mg/dL (ref 0.0–1.2)
CALCIUM: 9.8 mg/dL (ref 8.7–10.3)
CO2: 27 mmol/L (ref 20–29)
CREATININE: 0.88 mg/dL (ref 0.57–1.00)
Chloride: 102 mmol/L (ref 96–106)
GFR calc Af Amer: 81 mL/min/{1.73_m2} (ref >59)
GFR, EST NON AFRICAN AMERICAN: 71 mL/min/{1.73_m2} (ref >59)
GLOBULIN, TOTAL: 2.8 g/dL (ref 1.5–4.5)
GLUCOSE: 85 mg/dL (ref 65–99)
Potassium: 4.4 mmol/L (ref 3.5–5.2)
SODIUM: 144 mmol/L (ref 134–144)
Total Protein: 7 g/dL (ref 6.0–8.5)

## 2018-03-11 LAB — HEMOGLOBIN A1C
ESTIMATED AVERAGE GLUCOSE: 117 mg/dL
Hgb A1c MFr Bld: 5.7 % — ABNORMAL HIGH (ref 4.8–5.6)

## 2018-03-11 NOTE — Progress Notes (Signed)
Here are your lab results:  Your kidney function is stable. Your liver enzymes are elevated, likely due to fatty liver. This is likely due to your lack of movement, due to your ankle issue - and increased intake of processed foods.  I will recheck this at your next visit.   Your hba1c is 5.7, this is great. Your cholesterol looks great as well. Pls continue with current meds.   Please let me know if you have any questions.   Take care,   RS

## 2018-03-12 ENCOUNTER — Other Ambulatory Visit: Payer: Self-pay

## 2018-03-12 MED ORDER — SEMAGLUTIDE (1 MG/DOSE) 2 MG/1.5ML ~~LOC~~ SOPN: 1.0000 mg | PEN_INJECTOR | SUBCUTANEOUS | 1 refills | Status: DC

## 2018-03-18 ENCOUNTER — Other Ambulatory Visit: Payer: Self-pay | Admitting: Internal Medicine

## 2018-03-31 ENCOUNTER — Other Ambulatory Visit: Payer: Self-pay | Admitting: Internal Medicine

## 2018-04-03 ENCOUNTER — Other Ambulatory Visit: Payer: Self-pay | Admitting: Internal Medicine

## 2018-04-30 ENCOUNTER — Other Ambulatory Visit: Payer: Self-pay | Admitting: Internal Medicine

## 2018-05-25 ENCOUNTER — Other Ambulatory Visit: Payer: Self-pay | Admitting: Internal Medicine

## 2018-06-11 ENCOUNTER — Encounter: Payer: Self-pay | Admitting: Internal Medicine

## 2018-06-11 ENCOUNTER — Ambulatory Visit: Payer: BC Managed Care – PPO | Admitting: Internal Medicine

## 2018-06-11 VITALS — BP 118/72 | HR 71 | Temp 98.1°F | Ht 62.0 in | Wt 196.2 lb

## 2018-06-11 DIAGNOSIS — E1159 Type 2 diabetes mellitus with other circulatory complications: Secondary | ICD-10-CM | POA: Diagnosis not present

## 2018-06-11 DIAGNOSIS — Z6835 Body mass index (BMI) 35.0-35.9, adult: Secondary | ICD-10-CM

## 2018-06-11 DIAGNOSIS — E1169 Type 2 diabetes mellitus with other specified complication: Secondary | ICD-10-CM | POA: Diagnosis not present

## 2018-06-11 DIAGNOSIS — Z7982 Long term (current) use of aspirin: Secondary | ICD-10-CM

## 2018-06-11 DIAGNOSIS — I10 Essential (primary) hypertension: Secondary | ICD-10-CM | POA: Diagnosis not present

## 2018-06-11 DIAGNOSIS — F5101 Primary insomnia: Secondary | ICD-10-CM

## 2018-06-11 DIAGNOSIS — R5383 Other fatigue: Secondary | ICD-10-CM | POA: Diagnosis not present

## 2018-06-11 DIAGNOSIS — I152 Hypertension secondary to endocrine disorders: Secondary | ICD-10-CM

## 2018-06-11 DIAGNOSIS — E669 Obesity, unspecified: Secondary | ICD-10-CM

## 2018-06-11 MED ORDER — TEMAZEPAM 15 MG PO CAPS: 15.0000 mg | ORAL_CAPSULE | Freq: Every evening | ORAL | 0 refills | Status: DC | PRN

## 2018-06-11 MED ORDER — NALTREXONE-BUPROPION HCL ER 8-90 MG PO TB12: ORAL_TABLET | ORAL | 1 refills | Status: DC

## 2018-06-11 NOTE — Patient Instructions (Signed)
Diabetes Mellitus and Exercise Exercising regularly is important for your overall health, especially when you have diabetes (diabetes mellitus). Exercising is not only about losing weight. It has many other health benefits, such as increasing muscle strength and bone density and reducing body fat and stress. This leads to improved fitness, flexibility, and endurance, all of which result in better overall health. Exercise has additional benefits for people with diabetes, including:  Reducing appetite.  Helping to lower and control blood glucose.  Lowering blood pressure.  Helping to control amounts of fatty substances (lipids) in the blood, such as cholesterol and triglycerides.  Helping the body to respond better to insulin (improving insulin sensitivity).  Reducing how much insulin the body needs.  Decreasing the risk for heart disease by: ? Lowering cholesterol and triglyceride levels. ? Increasing the levels of good cholesterol. ? Lowering blood glucose levels. What is my activity plan? Your health care provider or certified diabetes educator can help you make a plan for the type and frequency of exercise (activity plan) that works for you. Make sure that you:  Do at least 150 minutes of moderate-intensity or vigorous-intensity exercise each week. This could be brisk walking, biking, or water aerobics. ? Do stretching and strength exercises, such as yoga or weightlifting, at least 2 times a week. ? Spread out your activity over at least 3 days of the week.  Get some form of physical activity every day. ? Do not go more than 2 days in a row without some kind of physical activity. ? Avoid being inactive for more than 30 minutes at a time. Take frequent breaks to walk or stretch.  Choose a type of exercise or activity that you enjoy, and set realistic goals.  Start slowly, and gradually increase the intensity of your exercise over time. What do I need to know about managing my  diabetes?   Check your blood glucose before and after exercising. ? If your blood glucose is 240 mg/dL (13.3 mmol/L) or higher before you exercise, check your urine for ketones. If you have ketones in your urine, do not exercise until your blood glucose returns to normal. ? If your blood glucose is 100 mg/dL (5.6 mmol/L) or lower, eat a snack containing 15-20 grams of carbohydrate. Check your blood glucose 15 minutes after the snack to make sure that your level is above 100 mg/dL (5.6 mmol/L) before you start your exercise.  Know the symptoms of low blood glucose (hypoglycemia) and how to treat it. Your risk for hypoglycemia increases during and after exercise. Common symptoms of hypoglycemia can include: ? Hunger. ? Anxiety. ? Sweating and feeling clammy. ? Confusion. ? Dizziness or feeling light-headed. ? Increased heart rate or palpitations. ? Blurry vision. ? Tingling or numbness around the mouth, lips, or tongue. ? Tremors or shakes. ? Irritability.  Keep a rapid-acting carbohydrate snack available before, during, and after exercise to help prevent or treat hypoglycemia.  Avoid injecting insulin into areas of the body that are going to be exercised. For example, avoid injecting insulin into: ? The arms, when playing tennis. ? The legs, when jogging.  Keep records of your exercise habits. Doing this can help you and your health care provider adjust your diabetes management plan as needed. Write down: ? Food that you eat before and after you exercise. ? Blood glucose levels before and after you exercise. ? The type and amount of exercise you have done. ? When your insulin is expected to peak, if you use   insulin. Avoid exercising at times when your insulin is peaking.  When you start a new exercise or activity, work with your health care provider to make sure the activity is safe for you, and to adjust your insulin, medicines, or food intake as needed.  Drink plenty of water while  you exercise to prevent dehydration or heat stroke. Drink enough fluid to keep your urine clear or pale yellow. Summary  Exercising regularly is important for your overall health, especially when you have diabetes (diabetes mellitus).  Exercising has many health benefits, such as increasing muscle strength and bone density and reducing body fat and stress.  Your health care provider or certified diabetes educator can help you make a plan for the type and frequency of exercise (activity plan) that works for you.  When you start a new exercise or activity, work with your health care provider to make sure the activity is safe for you, and to adjust your insulin, medicines, or food intake as needed. This information is not intended to replace advice given to you by your health care provider. Make sure you discuss any questions you have with your health care provider. Document Released: 07/14/2003 Document Revised: 11/01/2016 Document Reviewed: 10/03/2015 [employer]vier Interactive Patient Education  2019 [employer]  

## 2018-06-11 NOTE — Progress Notes (Signed)
Subjective:     Patient ID: Teresa Newton , female    DOB: 1955-08-31 , 63 y.o.   MRN: [id number]   Chief Complaint  Patient presents with  . Diabetes  . Hypertension    HPI  Diabetes  She presents for her follow-up diabetic visit. She has type 2 diabetes mellitus. Her disease course has been stable. There are no hypoglycemic associated symptoms. Associated symptoms include fatigue (she c/o fatigue. states she is not sleeping well. she is not sure why. denies change in sleeping/eating habits. ). Pertinent negatives for diabetes include no blurred vision and no chest pain. There are no hypoglycemic complications. Risk factors for coronary artery disease include diabetes mellitus, dyslipidemia, hypertension, obesity, post-menopausal and sedentary lifestyle. She is following a generally healthy and diabetic diet. She never participates in exercise. Her breakfast blood glucose is taken between 7-8 am. Her breakfast blood glucose range is generally 90-110 mg/dl. An ACE inhibitor/angiotensin II receptor blocker is being taken. Eye exam is current.  Hypertension  This is a chronic problem. The current episode started more than 1 year ago. The problem has been gradually improving since onset. The problem is controlled. Pertinent negatives include no blurred vision, chest pain, palpitations or shortness of breath.  She reports compliance with meds.    Past Medical History:  Diagnosis Date  . Bimalleolar ankle fracture    right  . Hypertension   . LTBI (latent tuberculosis infection)   . Plaque psoriasis   . Pre-diabetes   . Sleep apnea    gastric bypass, lost 70lbs and no longer needs CPAP     Family History  Problem Relation Age of Onset  . Psoriasis Brother   . Tuberculosis Mother   . Heart Problems Mother   . Dementia Mother   . Stroke Mother   . Breast cancer Paternal Aunt 41  . Ovarian cancer Sister      Current Outpatient Medications:  .  aspirin 81 MG tablet, Take 81 mg by  mouth daily., Disp: , Rfl:  .  CALCIUM PO, Take 1 tablet by mouth daily., Disp: , Rfl:  .  Cholecalciferol (VITAMIN D3) 125 MCG (5000 UT) CAPS, Take by mouth., Disp: , Rfl:  .  Cyanocobalamin (B-12 PO), Take 1 tablet by mouth daily., Disp: , Rfl:  .  EPINEPHRINE 0.3 mg/0.3 mL IJ SOAJ injection, INJECT AS DIRECTED FOR ANAPHYLAXIS SHOCK, Disp: 1 Device, Rfl: 2 .  hydrochlorothiazide (MICROZIDE) 12.5 MG capsule, Take 12.5 mg by mouth daily., Disp: , Rfl:  .  lisinopril (PRINIVIL,ZESTRIL) 10 MG tablet, Take 5 mg by mouth daily. , Disp: , Rfl:  .  magnesium oxide (MAG-OX) 400 MG tablet, Take 400 mg by mouth daily., Disp: , Rfl:  .  OZEMPIC, 1 MG/DOSE, 2 MG/1.5ML SOPN, INJECT 1 MG INTO THE SKIN ONCE A WEEK., Disp: 3 pen, Rfl: 1 .  POTASSIUM PO, Take 1 tablet by mouth daily., Disp: , Rfl:  .  Prenatal Vit-Fe Fumarate-FA (PRENATAL PO), Take 1 tablet by mouth daily., Disp: , Rfl:  .  rosuvastatin (CRESTOR) 5 MG tablet, TAKE 1 TABLET BY MOUTH EVERY DAY, Disp: 30 tablet, Rfl: 4 .  Naltrexone-buPROPion HCl ER (CONTRAVE) 8-90 MG TB12, Take 2 tabs po twice daily, Disp: 120 tablet, Rfl: 1 .  temazepam (RESTORIL) 15 MG capsule, Take 1 capsule (15 mg total) by mouth at bedtime as needed for sleep., Disp: 30 capsule, Rfl: 0   Allergies  Allergen Reactions  . Fish Allergy Anaphylaxis  .  Milk-Related Compounds Anaphylaxis    Goat Milk ONLY   . Penicillins Hives     Review of Systems  Constitutional: Positive for fatigue (she c/o fatigue. states she is not sleeping well. she is not sure why. denies change in sleeping/eating habits. ).  Eyes: Negative for blurred vision.  Respiratory: Negative.  Negative for shortness of breath.   Cardiovascular: Negative.  Negative for chest pain and palpitations.  Gastrointestinal: Negative.   Neurological: Negative.   Psychiatric/Behavioral: Negative.      Today's Vitals   06/11/18 0936  BP: 118/72  Pulse: 71  Temp: 98.1 F (36.7 C)  TempSrc: Oral  Weight: 196  lb 3.2 oz (89 kg)  Height: 5' 2" (1.575 m)   Body mass index is 35.89 kg/m.   Objective:  Physical Exam Vitals signs and nursing note reviewed.  Constitutional:      Appearance: Normal appearance. She is obese.  HENT:     Head: Normocephalic and atraumatic.  Cardiovascular:     Rate and Rhythm: Normal rate and regular rhythm.     Heart sounds: Normal heart sounds.  Pulmonary:     Effort: Pulmonary effort is normal.     Breath sounds: Normal breath sounds.  Skin:    General: Skin is warm.  Neurological:     General: No focal deficit present.     Mental Status: She is alert and oriented to person, place, and time.  Psychiatric:        Mood and Affect: Mood normal.         Assessment And Plan:     1. Obesity, diabetes, and hypertension syndrome ([place])  I will check labs as listed below. She is encouraged to incorporate more exercise into her daily routine. Her BP is well controlled. She will continue with current meds. She is encouraged to avoid adding salt to her foods.   - CMP14+EGFR - Hemoglobin A1c  2. Fatigue, unspecified type  Her symptoms are likely due to #3.  She is encouraged to stay well hydrated. I will also check labs as listed below.   - Vitamin B12 - TSH  3. Primary insomnia  SHE IS ADVISED TO PRACTICE GOOD SLEEP HYGIENE WHICH INCLUDES GOING TO BED AT THE SAME TIME EVERY NIGHT (EVEN WEEKENDS), RESERVE BEDROOM FOR SLEEPING ONLY, AVOID ALCOHOL, TOBACCO, CAFFEINE, AND OTHER ILLICIT SUBSTANCES 1 HOUR BEFORE BED. ALSO RECOMMEND RELAXATION EXERCISES, AND SLEEPING IN A ROOM DARK AND QUIET. DUE TO LENGTH OF TIME SHE HAS BEEN EXPERIENCING THIS, I WILL ALSO SEND RX TEMAZEPAM, 15MG NIGHTLY AS NEEDED.   4. Class 2 severe obesity due to excess calories with serious comorbidity and body mass index (BMI) of 35.0 to 35.9 in adult [facility])  Importance of achieving optimal weight to decrease risk of cardiovascular disease and cancers was discussed with the patient in full  detail. She is encouraged to start slowly - start with 10 minutes twice daily at least three to four days per week and to gradually build to 30 minutes five days weekly. She was given tips to incorporate more activity into her daily routine - take stairs when possible, park farther away from her job, grocery stores, etc.    [name], MD

## 2018-06-12 LAB — CMP14+EGFR
ALBUMIN: 4.4 g/dL (ref 3.8–4.8)
ALT: 41 IU/L — ABNORMAL HIGH (ref 0–32)
AST: 28 IU/L (ref 0–40)
Albumin/Globulin Ratio: 1.4 (ref 1.2–2.2)
Alkaline Phosphatase: 88 IU/L (ref 39–117)
BUN / CREAT RATIO: 27 (ref 12–28)
BUN: 23 mg/dL (ref 8–27)
Bilirubin Total: 0.7 mg/dL (ref 0.0–1.2)
CALCIUM: 9.6 mg/dL (ref 8.7–10.3)
CO2: 25 mmol/L (ref 20–29)
CREATININE: 0.84 mg/dL (ref 0.57–1.00)
Chloride: 99 mmol/L (ref 96–106)
GFR calc Af Amer: 86 mL/min/{1.73_m2} (ref >59)
GFR, EST NON AFRICAN AMERICAN: 75 mL/min/{1.73_m2} (ref >59)
GLOBULIN, TOTAL: 3.2 g/dL (ref 1.5–4.5)
Glucose: 88 mg/dL (ref 65–99)
Potassium: 4.4 mmol/L (ref 3.5–5.2)
SODIUM: 139 mmol/L (ref 134–144)
Total Protein: 7.6 g/dL (ref 6.0–8.5)

## 2018-06-12 LAB — HEMOGLOBIN A1C
Est. average glucose Bld gHb Est-mCnc: 117 mg/dL
Hgb A1c MFr Bld: 5.7 % — ABNORMAL HIGH (ref 4.8–5.6)

## 2018-06-12 LAB — TSH: TSH: 0.798 u[IU]/mL (ref 0.450–4.500)

## 2018-06-12 LAB — VITAMIN B12: Vitamin B-12: >2000 pg/mL — ABNORMAL HIGH (ref 232–1245)

## 2018-06-29 ENCOUNTER — Other Ambulatory Visit: Payer: Self-pay | Admitting: Internal Medicine

## 2018-07-24 ENCOUNTER — Other Ambulatory Visit: Payer: Self-pay

## 2018-07-24 MED ORDER — SEMAGLUTIDE (1 MG/DOSE) 2 MG/1.5ML ~~LOC~~ SOPN: 1.0000 mg | PEN_INJECTOR | SUBCUTANEOUS | 1 refills | Status: DC

## 2018-08-19 ENCOUNTER — Other Ambulatory Visit: Payer: Self-pay | Admitting: Internal Medicine

## 2018-09-08 ENCOUNTER — Other Ambulatory Visit: Payer: Self-pay | Admitting: Internal Medicine

## 2018-09-08 DIAGNOSIS — Z1231 Encounter for screening mammogram for malignant neoplasm of breast: Secondary | ICD-10-CM

## 2018-10-01 ENCOUNTER — Encounter: Payer: Self-pay | Admitting: Internal Medicine

## 2018-10-20 ENCOUNTER — Other Ambulatory Visit: Payer: Self-pay | Admitting: Internal Medicine

## 2018-10-31 ENCOUNTER — Other Ambulatory Visit: Payer: Self-pay

## 2018-10-31 ENCOUNTER — Ambulatory Visit
Admission: RE | Admit: 2018-10-31 | Discharge: 2018-10-31 | Disposition: A | Discharge status: Home / Self Care | Payer: BC Managed Care – PPO | Source: Ambulatory Visit | Attending: Internal Medicine | Admitting: Internal Medicine

## 2018-10-31 DIAGNOSIS — Z1231 Encounter for screening mammogram for malignant neoplasm of breast: Secondary | ICD-10-CM

## 2018-11-06 ENCOUNTER — Other Ambulatory Visit: Payer: Self-pay | Admitting: Internal Medicine

## 2018-11-10 ENCOUNTER — Other Ambulatory Visit: Payer: Self-pay

## 2018-11-10 ENCOUNTER — Ambulatory Visit: Payer: BC Managed Care – PPO | Admitting: Internal Medicine

## 2018-11-10 ENCOUNTER — Encounter: Payer: Self-pay | Admitting: Internal Medicine

## 2018-11-10 VITALS — BP 118/70 | HR 70 | Temp 98.3°F | Ht 62.4 in | Wt 197.2 lb

## 2018-11-10 DIAGNOSIS — I10 Essential (primary) hypertension: Secondary | ICD-10-CM | POA: Diagnosis not present

## 2018-11-10 DIAGNOSIS — Z23 Encounter for immunization: Secondary | ICD-10-CM

## 2018-11-10 DIAGNOSIS — Z0000 Encounter for general adult medical examination without abnormal findings: Secondary | ICD-10-CM

## 2018-11-10 DIAGNOSIS — E1169 Type 2 diabetes mellitus with other specified complication: Secondary | ICD-10-CM

## 2018-11-10 DIAGNOSIS — I152 Hypertension secondary to endocrine disorders: Secondary | ICD-10-CM

## 2018-11-10 DIAGNOSIS — E1159 Type 2 diabetes mellitus with other circulatory complications: Secondary | ICD-10-CM | POA: Diagnosis not present

## 2018-11-10 DIAGNOSIS — E669 Obesity, unspecified: Secondary | ICD-10-CM

## 2018-11-10 DIAGNOSIS — E119 Type 2 diabetes mellitus without complications: Secondary | ICD-10-CM

## 2018-11-10 DIAGNOSIS — R9431 Abnormal electrocardiogram [ECG] [EKG]: Secondary | ICD-10-CM

## 2018-11-10 LAB — POCT UA - MICROALBUMIN
Albumin/Creatinine Ratio, Urine, POC: <30
Creatinine, POC: 100 mg/dL
Microalbumin Ur, POC: 10 mg/L

## 2018-11-10 LAB — POCT URINALYSIS DIPSTICK
Bilirubin, UA: NEGATIVE
Glucose, UA: NEGATIVE
Ketones, UA: NEGATIVE
Leukocytes, UA: NEGATIVE
Nitrite, UA: NEGATIVE
Protein, UA: NEGATIVE
Spec Grav, UA: 1.02 (ref 1.010–1.025)
Urobilinogen, UA: 0.2 E.U./dL
pH, UA: 5.5 (ref 5.0–8.0)

## 2018-11-10 MED ORDER — PNEUMOCOCCAL 13-VAL CONJ VACC IM SUSP: 0.5000 mL | INTRAMUSCULAR | 0 refills | Status: AC

## 2018-11-10 NOTE — Patient Instructions (Signed)
Health Maintenance, Female Adopting a healthy lifestyle and getting preventive care are important in promoting health and wellness. Ask your health care provider about:  The right schedule for you to have regular tests and exams.  Things you can do on your own to prevent diseases and keep yourself healthy. What should I know about diet, weight, and exercise? Eat a healthy diet   Eat a diet that includes plenty of vegetables, fruits, low-fat dairy products, and lean protein.  Do not eat a lot of foods that are high in solid fats, added sugars, or sodium. Maintain a healthy weight Body mass index (BMI) is used to identify weight problems. It estimates body fat based on height and weight. Your health care provider can help determine your BMI and help you achieve or maintain a healthy weight. Get regular exercise Get regular exercise. This is one of the most important things you can do for your health. Most adults should:  Exercise for at least 150 minutes each week. The exercise should increase your heart rate and make you sweat (moderate-intensity exercise).  Do strengthening exercises at least twice a week. This is in addition to the moderate-intensity exercise.  Spend less time sitting. Even light physical activity can be beneficial. Watch cholesterol and blood lipids Have your blood tested for lipids and cholesterol at 63 years of age, then have this test every 5 years. Have your cholesterol levels checked more often if:  Your lipid or cholesterol levels are high.  You are older than 63 years of age.  You are at high risk for heart disease. What should I know about cancer screening? Depending on your health history and family history, you may need to have cancer screening at various ages. This may include screening for:  Breast cancer.  Cervical cancer.  Colorectal cancer.  Skin cancer.  Lung cancer. What should I know about heart disease, diabetes, and high blood  pressure? Blood pressure and heart disease  High blood pressure causes heart disease and increases the risk of stroke. This is more likely to develop in people who have high blood pressure readings, are of African descent, or are overweight.  Have your blood pressure checked: ? Every 3-5 years if you are 18-39 years of age. ? Every year if you are 40 years old or older. Diabetes Have regular diabetes screenings. This checks your fasting blood sugar level. Have the screening done:  Once every three years after age 40 if you are at a normal weight and have a low risk for diabetes.  More often and at a younger age if you are overweight or have a high risk for diabetes. What should I know about preventing infection? Hepatitis B If you have a higher risk for hepatitis B, you should be screened for this virus. Talk with your health care provider to find out if you are at risk for hepatitis B infection. Hepatitis C Testing is recommended for:  Everyone born from 1945 through 1965.  Anyone with known risk factors for hepatitis C. Sexually transmitted infections (STIs)  Get screened for STIs, including gonorrhea and chlamydia, if: ? You are sexually active and are younger than 63 years of age. ? You are older than 63 years of age and your health care provider tells you that you are at risk for this type of infection. ? Your sexual activity has changed since you were last screened, and you are at increased risk for chlamydia or gonorrhea. Ask your health care provider if   you are at risk.  Ask your health care provider about whether you are at high risk for HIV. Your health care provider may recommend a prescription medicine to help prevent HIV infection. If you choose to take medicine to prevent HIV, you should first get tested for HIV. You should then be tested every 3 months for as long as you are taking the medicine. Pregnancy  If you are about to stop having your period (premenopausal) and  you may become pregnant, seek counseling before you get pregnant.  Take 400 to 800 micrograms (mcg) of folic acid every day if you become pregnant.  Ask for birth control (contraception) if you want to prevent pregnancy. Osteoporosis and menopause Osteoporosis is a disease in which the bones lose minerals and strength with aging. This can result in bone fractures. If you are 65 years old or older, or if you are at risk for osteoporosis and fractures, ask your health care provider if you should:  Be screened for bone loss.  Take a calcium or vitamin D supplement to lower your risk of fractures.  Be given hormone replacement therapy (HRT) to treat symptoms of menopause. Follow these instructions at home: Lifestyle  Do not use any products that contain nicotine or tobacco, such as cigarettes, e-cigarettes, and chewing tobacco. If you need help quitting, ask your health care provider.  Do not use street drugs.  Do not share needles.  Ask your health care provider for help if you need support or information about quitting drugs. Alcohol use  Do not drink alcohol if: ? Your health care provider tells you not to drink. ? You are pregnant, may be pregnant, or are planning to become pregnant.  If you drink alcohol: ? Limit how much you use to 0-1 drink a day. ? Limit intake if you are breastfeeding.  Be aware of how much alcohol is in your drink. In the [place] one drink equals one 12 oz bottle of beer (355 mL), one 5 oz glass of wine (148 mL), or one 1 oz glass of hard liquor (44 mL). General instructions  Schedule regular health, dental, and eye exams.  Stay current with your vaccines.  Tell your health care provider if: ? You often feel depressed. ? You have ever been abused or do not feel safe at home. Summary  Adopting a healthy lifestyle and getting preventive care are important in promoting health and wellness.  Follow your health care provider's instructions about healthy  diet, exercising, and getting tested or screened for diseases.  Follow your health care provider's instructions on monitoring your cholesterol and blood pressure. This information is not intended to replace advice given to you by your health care provider. Make sure you discuss any questions you have with your health care provider. Document Released: 11/06/2010 Document Revised: 04/16/2018 Document Reviewed: 04/16/2018 Elsevier Patient Education  2020 [employer]  

## 2018-11-10 NOTE — Progress Notes (Signed)
Subjective:     Patient ID: Teresa Newton , female    DOB: 02-Aug-1955 , 63 y.o.   MRN: [id number]   Chief Complaint  Patient presents with  . Annual Exam  . Diabetes  . Hypertension    HPI  She is here today for a full physical examination. She is followed by GYN for her pelvic exams.   Diabetes She presents for her follow-up diabetic visit. She has type 2 diabetes mellitus. Her disease course has been stable. There are no hypoglycemic associated symptoms. Pertinent negatives for diabetes include no blurred vision and no chest pain. Fatigue: she c/o fatigue. states she is not sleeping well. she is not sure why. denies change in sleeping/eating habits.  There are no hypoglycemic complications. Risk factors for coronary artery disease include diabetes mellitus, dyslipidemia, hypertension, obesity, post-menopausal and sedentary lifestyle. She is following a generally healthy and diabetic diet. She never participates in exercise. Her breakfast blood glucose is taken between 7-8 am. Her breakfast blood glucose range is generally 90-110 mg/dl. An ACE inhibitor/angiotensin II receptor blocker is being taken. Eye exam is not current.  Hypertension This is a chronic problem. The current episode started more than 1 year ago. The problem has been gradually improving since onset. The problem is controlled. Pertinent negatives include no blurred vision, chest pain, palpitations or shortness of breath. Risk factors for coronary artery disease include post-menopausal state, obesity, sedentary lifestyle, dyslipidemia and diabetes mellitus. The current treatment provides moderate improvement.     Past Medical History:  Diagnosis Date  . Bimalleolar ankle fracture    right  . Hypertension   . LTBI (latent tuberculosis infection)   . Plaque psoriasis   . Pre-diabetes   . Sleep apnea    gastric bypass, lost 70lbs and no longer needs CPAP     Family History  Problem Relation Age of Onset  .  Psoriasis Brother   . Tuberculosis Mother   . Heart Problems Mother   . Dementia Mother   . Stroke Mother   . Breast cancer Paternal Aunt 49  . Ovarian cancer Sister      Current Outpatient Medications:  .  aspirin 81 MG tablet, Take 81 mg by mouth daily., Disp: , Rfl:  .  CALCIUM PO, Take 1 tablet by mouth daily., Disp: , Rfl:  .  Cholecalciferol (VITAMIN D3) 125 MCG (5000 UT) CAPS, Take by mouth., Disp: , Rfl:  .  Cyanocobalamin (B-12 PO), Take 1 tablet by mouth daily., Disp: , Rfl:  .  EPINEPHRINE 0.3 mg/0.3 mL IJ SOAJ injection, INJECT AS DIRECTED FOR ANAPHYLAXIS SHOCK, Disp: 1 Device, Rfl: 2 .  hydrochlorothiazide (MICROZIDE) 12.5 MG capsule, TAKE 1 CAPSULE BY MOUTH EVERY DAY, Disp: 90 capsule, Rfl: 2 .  lisinopril (ZESTRIL) 10 MG tablet, TAKE 1 TABLET BY MOUTH DAILY (Patient taking differently: 1/2 tablet daily), Disp: 90 tablet, Rfl: 2 .  magnesium oxide (MAG-OX) 400 MG tablet, Take 400 mg by mouth daily., Disp: , Rfl:  .  OZEMPIC, 1 MG/DOSE, 2 MG/1.5ML SOPN, INJECT 1MG INTO THE SKIN ONCE A WEEK., Disp: 9 pen, Rfl: 1 .  POTASSIUM PO, Take 1 tablet by mouth daily., Disp: , Rfl:  .  Prenatal Vit-Fe Fumarate-FA (PRENATAL PO), Take 1 tablet by mouth daily., Disp: , Rfl:  .  rosuvastatin (CRESTOR) 5 MG tablet, TAKE 1 TABLET BY MOUTH EVERY DAY, Disp: 90 tablet, Rfl: 1 .  temazepam (RESTORIL) 15 MG capsule, Take 1 capsule (15 mg total) by mouth  at bedtime as needed for sleep., Disp: 30 capsule, Rfl: 0 .  pneumococcal 13-valent conjugate vaccine (PREVNAR 13) SUSP injection, Inject 0.5 mLs into the muscle tomorrow at 10 am for 1 dose., Disp: 0.5 mL, Rfl: 0   Allergies  Allergen Reactions  . Fish Allergy Anaphylaxis  . Milk-Related Compounds Anaphylaxis    Goat Milk ONLY   . Penicillins Hives     The patient states she uses post menopausal status for birth control. Last LMP was No LMP recorded. Patient is postmenopausal.. Negative for Dysmenorrhea  Negative for: breast discharge,  breast lump(s), breast pain and breast self exam. Associated symptoms include abnormal vaginal bleeding. Pertinent negatives include abnormal bleeding (hematology), anxiety, decreased libido, depression, difficulty falling sleep, dyspareunia, history of infertility, nocturia, sexual dysfunction, sleep disturbances, urinary incontinence, urinary urgency, vaginal discharge and vaginal itching. Diet regular.The patient states her exercise level is  intermittent.   . The patient's tobacco use is:  Social History   Tobacco Use  Smoking Status Former Smoker  . Packs/day: 0.50  . Years: 3.00  . Pack years: 1.50  Smokeless Tobacco Never Used  . She has been exposed to passive smoke. The patient's alcohol use is:  Social History   Substance and Sexual Activity  Alcohol Use No  . Alcohol/week: 0.0 standard drinks    Review of Systems  Constitutional: Negative.  Fatigue: she c/o fatigue. states she is not sleeping well. she is not sure why. denies change in sleeping/eating habits.   HENT: Negative.   Eyes: Negative.  Negative for blurred vision.  Respiratory: Negative.  Negative for shortness of breath.   Cardiovascular: Negative.  Negative for chest pain and palpitations.  Endocrine: Negative.   Genitourinary: Negative.   Musculoskeletal: Negative.   Skin: Negative.   Allergic/Immunologic: Negative.   Neurological: Negative.   Hematological: Negative.   Psychiatric/Behavioral: Negative.      Today's Vitals   11/10/18 1131  BP: 118/70  Pulse: 70  Temp: 98.3 F (36.8 C)  TempSrc: Oral  Weight: 197 lb 3.2 oz (89.4 kg)  Height: 5' 2.4" (1.585 m)   Body mass index is 35.61 kg/m.   Objective:  Physical Exam Vitals signs and nursing note reviewed.  Constitutional:      Appearance: Normal appearance. She is obese.  HENT:     Head: Normocephalic and atraumatic.     Right Ear: Tympanic membrane, ear canal and external ear normal.     Left Ear: Tympanic membrane, ear canal and  external ear normal.     Nose: Nose normal.     Mouth/Throat:     Mouth: Mucous membranes are moist.     Pharynx: Oropharynx is clear.  Eyes:     Extraocular Movements: Extraocular movements intact.     Conjunctiva/sclera: Conjunctivae normal.     Pupils: Pupils are equal, round, and reactive to light.  Neck:     Musculoskeletal: Normal range of motion and neck supple.  Cardiovascular:     Rate and Rhythm: Normal rate and regular rhythm.     Pulses: Normal pulses.          Dorsalis pedis pulses are 2+ on the right side and 2+ on the left side.     Heart sounds: Normal heart sounds.  Pulmonary:     Effort: Pulmonary effort is normal.     Breath sounds: Normal breath sounds.  Chest:     Breasts:        Right: Normal. No swelling, bleeding, inverted nipple,  mass, nipple discharge or skin change.        Left: Normal. No swelling, bleeding, inverted nipple, mass, nipple discharge or skin change.  Abdominal:     General: Bowel sounds are normal.     Palpations: Abdomen is soft.     Comments: Obese, difficult to assess organomegaly  Genitourinary:    Comments: deferred Musculoskeletal: Normal range of motion.  Feet:     Right foot:     Protective Sensation: 5 sites tested. 5 sites sensed.     Skin integrity: Skin integrity normal.     Toenail Condition: Right toenails are normal.     Left foot:     Protective Sensation: 5 sites tested. 5 sites sensed.     Skin integrity: Skin integrity normal.     Toenail Condition: Left toenails are normal.  Skin:    General: Skin is warm and dry.  Neurological:     General: No focal deficit present.     Mental Status: She is alert and oriented to person, place, and time.  Psychiatric:        Mood and Affect: Mood normal.        Behavior: Behavior normal.         Assessment And Plan:     1. Routine general medical examination at health care facility  A full exam was performed. Importance of monthly self breast exams was discussed with  the patient. PATIENT HAS BEEN ADVISED TO GET 30-45 MINUTES REGULAR EXERCISE NO LESS THAN FOUR TO FIVE DAYS PER WEEK - BOTH WEIGHTBEARING EXERCISES AND AEROBIC ARE RECOMMENDED.  SHE IS ADVISED TO FOLLOW A HEALTHY DIET WITH AT LEAST SIX FRUITS/VEGGIES PER DAY, DECREASE INTAKE OF RED MEAT, AND TO INCREASE FISH INTAKE TO TWO DAYS PER WEEK.  MEATS/FISH SHOULD NOT BE FRIED, BAKED OR BROILED IS PREFERABLE.  I SUGGEST WEARING SPF 50 SUNSCREEN ON EXPOSED PARTS AND ESPECIALLY WHEN IN THE DIRECT SUNLIGHT FOR AN EXTENDED PERIOD OF TIME.  PLEASE AVOID FAST FOOD RESTAURANTS AND INCREASE YOUR WATER INTAKE.  - CMP14+EGFR - CBC - Lipid panel - Hemoglobin A1c  2. Obesity, diabetes, and hypertension syndrome ([place])  Diabetic foot exam was performed. I DISCUSSED WITH THE PATIENT AT LENGTH REGARDING THE GOALS OF GLYCEMIC CONTROL AND POSSIBLE LONG-TERM COMPLICATIONS.  I  ALSO STRESSED THE IMPORTANCE OF COMPLIANCE WITH HOME GLUCOSE MONITORING, DIETARY RESTRICTIONS INCLUDING AVOIDANCE OF SUGARY DRINKS/PROCESSED FOODS,  ALONG WITH REGULAR EXERCISE.  I  ALSO STRESSED THE IMPORTANCE OF ANNUAL EYE EXAMS, SELF FOOT CARE AND COMPLIANCE WITH OFFICE VISITS.  HTN: Her blood pressure is well controlled. She will continue with current meds. She is encouraged to avoid adding salt to her foods.   Obesity: She is encouraged to incorporate more exercise into her daily routine.   - Ambulatory referral to Ophthalmology - POCT Urinalysis Dipstick (81002) - POCT UA - Microalbumin - EKG 12-Lead  3. Abnormal finding on EKG  New T wave inversions noted on EKG. I will refer her to Cardiology for further evaluation. She is currently asymptomatic; however, given her risk factors, she agrees to Cardiology evaluation.   - Ambulatory referral to Cardiology  4. Need for vaccination  A rx for [id number] was sent to her pharmacy. She was encouraged to notify me the day she has this performed so I can update her chart accordingly.   [name], MD    THE PATIENT IS ENCOURAGED TO PRACTICE SOCIAL DISTANCING DUE TO THE COVID-19 PANDEMIC.

## 2018-11-11 LAB — CBC
Hematocrit: 39.7 % (ref 34.0–46.6)
Hemoglobin: 13 g/dL (ref 11.1–15.9)
MCH: 29.7 pg (ref 26.6–33.0)
MCHC: 32.7 g/dL (ref 31.5–35.7)
MCV: 91 fL (ref 79–97)
Platelets: 249 10*3/uL (ref 150–450)
RBC: 4.37 x10E6/uL (ref 3.77–5.28)
RDW: 12.9 % (ref 11.7–15.4)
WBC: 5.3 10*3/uL (ref 3.4–10.8)

## 2018-11-11 LAB — LIPID PANEL
Chol/HDL Ratio: 2.1 ratio (ref 0.0–4.4)
Cholesterol, Total: 144 mg/dL (ref 100–199)
HDL: 67 mg/dL (ref >39)
LDL Calculated: 63 mg/dL (ref 0–99)
Triglycerides: 71 mg/dL (ref 0–149)
VLDL Cholesterol Cal: 14 mg/dL (ref 5–40)

## 2018-11-11 LAB — CMP14+EGFR
ALT: 38 IU/L — ABNORMAL HIGH (ref 0–32)
AST: 31 IU/L (ref 0–40)
Albumin/Globulin Ratio: 1.5 (ref 1.2–2.2)
Albumin: 4.6 g/dL (ref 3.8–4.8)
Alkaline Phosphatase: 94 IU/L (ref 39–117)
BUN/Creatinine Ratio: 20 (ref 12–28)
BUN: 17 mg/dL (ref 8–27)
Bilirubin Total: 0.7 mg/dL (ref 0.0–1.2)
CO2: 25 mmol/L (ref 20–29)
Calcium: 9.3 mg/dL (ref 8.7–10.3)
Chloride: 99 mmol/L (ref 96–106)
Creatinine, Ser: 0.86 mg/dL (ref 0.57–1.00)
GFR calc Af Amer: 84 mL/min/{1.73_m2} (ref >59)
GFR calc non Af Amer: 73 mL/min/{1.73_m2} (ref >59)
Globulin, Total: 3 g/dL (ref 1.5–4.5)
Glucose: 83 mg/dL (ref 65–99)
Potassium: 4 mmol/L (ref 3.5–5.2)
Sodium: 142 mmol/L (ref 134–144)
Total Protein: 7.6 g/dL (ref 6.0–8.5)

## 2018-11-11 LAB — HEMOGLOBIN A1C
Est. average glucose Bld gHb Est-mCnc: 114 mg/dL
Hgb A1c MFr Bld: 5.6 % (ref 4.8–5.6)

## 2018-11-19 ENCOUNTER — Ambulatory Visit: Payer: BC Managed Care – PPO | Admitting: Cardiology

## 2018-11-19 ENCOUNTER — Other Ambulatory Visit: Payer: Self-pay

## 2018-11-19 ENCOUNTER — Encounter: Payer: Self-pay | Admitting: Cardiology

## 2018-11-19 VITALS — BP 148/75 | HR 63 | Temp 97.7°F | Ht 63.0 in | Wt 199.0 lb

## 2018-11-19 DIAGNOSIS — R7303 Prediabetes: Secondary | ICD-10-CM | POA: Diagnosis not present

## 2018-11-19 DIAGNOSIS — E669 Obesity, unspecified: Secondary | ICD-10-CM | POA: Diagnosis not present

## 2018-11-19 DIAGNOSIS — R9431 Abnormal electrocardiogram [ECG] [EKG]: Secondary | ICD-10-CM | POA: Diagnosis not present

## 2018-11-19 DIAGNOSIS — E7800 Pure hypercholesterolemia, unspecified: Secondary | ICD-10-CM

## 2018-11-19 DIAGNOSIS — I10 Essential (primary) hypertension: Secondary | ICD-10-CM

## 2018-11-19 NOTE — Progress Notes (Signed)
Primary Physician:  Dorothyann PengSanders, Robyn, MD   Patient ID: Teresa Newton, female    DOB: April 02, 1956, 63 y.o.   MRN: [id number]  Subjective:    Chief Complaint  Patient presents with  . New Patient (Initial Visit)  . Abnormal ECG    HPI: Teresa Newton  is a 63 y.o. female  with hypertension, diabetes, history of sleep apnea that resolved after 70 pound weight loss with gastric bypass surgery in 2015, with newly noted EKG abnormalities referred to us for further evaluation by Dr. Allyne GeeSanders.  Patient is essentially asymptomatic. Denies any chest pain, shortness of breath, leg swelling (excpet for right ankle due to fracture), syncope, or symptoms of claudication or TIA. She maintained weight loss until about 3 months ago, she gained about 15 lbs. She states that diabetes and hyperlipidemia is well controlled. States that her blood pressure is generally always in the 120 systolic at home.  She has recently moved and that has is the most strenuous activity she has done recently. She does not exercise, but feels that she is fairly active. She is former smoker that quit 40 years ago. No alcohol or drug use. No family history of heart disease.   Past Medical History:  Diagnosis Date  . Bimalleolar ankle fracture    right  . Hypertension   . LTBI (latent tuberculosis infection)   . Plaque psoriasis   . Pre-diabetes   . Sleep apnea    gastric bypass, lost 70lbs and no longer needs CPAP    Past Surgical History:  Procedure Laterality Date  . COLONOSCOPY    . FOOT SURGERY Right   . GASTRIC BYPASS    . ORIF ANKLE FRACTURE Right 01/30/2018   Procedure: OPEN REDUCTION INTERNAL FIXATION (ORIF) right ankle trimalleolar fracture;  Surgeon: Toni ArthursHewitt, John, MD;  Location: [facility];  Service: Orthopedics;  Laterality: Right;  . TUBAL LIGATION      Social History   Socioeconomic History  . Marital status: Married    Spouse name: Not on file  . Number of children: 4  . Years of  education: Not on file  . Highest education level: Not on file  Occupational History  . Not on file  Social Needs  . Financial resource strain: Not on file  . Food insecurity    Worry: Not on file    Inability: Not on file  . Transportation needs    Medical: Not on file    Non-medical: Not on file  Tobacco Use  . Smoking status: Former Smoker    Packs/day: 0.50    Years: 3.00    Pack years: 1.50    Quit date: 1979    Years since quitting: 41.5  . Smokeless tobacco: Never Used  Substance and Sexual Activity  . Alcohol use: No    Alcohol/week: 0.0 standard drinks  . Drug use: No  . Sexual activity: Yes    Partners: Male  Lifestyle  . Physical activity    Days per week: Not on file    Minutes per session: Not on file  . Stress: Not on file  Relationships  . Social Musicianconnections    Talks on phone: Not on file    Gets together: Not on file    Attends religious service: Not on file    Active member of club or organization: Not on file    Attends meetings of clubs or organizations: Not on file    Relationship status: Not on file  .  Intimate partner violence    Fear of current or ex partner: Not on file    Emotionally abused: Not on file    Physically abused: Not on file    Forced sexual activity: Not on file  Other Topics Concern  . Not on file  Social History Narrative  . Not on file    Review of Systems  Constitution: Negative for decreased appetite, malaise/fatigue, weight gain and weight loss.  Eyes: Negative for visual disturbance.  Cardiovascular: Negative for chest pain, claudication, dyspnea on exertion, leg swelling, orthopnea, palpitations and syncope.  Respiratory: Negative for hemoptysis and wheezing.   Endocrine: Negative for cold intolerance and heat intolerance.  Hematologic/Lymphatic: Does not bruise/bleed easily.  Skin: Negative for nail changes.  Musculoskeletal: Negative for muscle weakness and myalgias.  Gastrointestinal: Negative for abdominal  pain, change in bowel habit, nausea and vomiting.  Neurological: Negative for difficulty with concentration, dizziness, focal weakness and headaches.  Psychiatric/Behavioral: Negative for altered mental status and suicidal ideas.  All other systems reviewed and are negative.     Objective:  Blood pressure (!) 148/75, pulse 63, temperature 97.7 F (36.5 C), height 5\' 3"  (1.6 m), weight 199 lb (90.3 kg), SpO2 98 %. Body mass index is 35.25 kg/m.    Physical Exam  Constitutional: She is oriented to person, place, and time. Vital signs are normal. She appears well-developed and well-nourished.  HENT:  Head: Normocephalic and atraumatic.  Neck: Normal range of motion.  Cardiovascular: Normal rate, regular rhythm, normal heart sounds and intact distal pulses.  Pulmonary/Chest: Effort normal and breath sounds normal. No accessory muscle usage. No respiratory distress.  Abdominal: Soft. Bowel sounds are normal.  Musculoskeletal: Normal range of motion.  Neurological: She is alert and oriented to person, place, and time.  Skin: Skin is warm and dry.  Vitals reviewed.  Radiology: No results found.  Laboratory examination:    CMP Latest Ref Rng & Units 11/10/2018 06/11/2018 03/10/2018  Glucose 65 - 99 mg/dL 83 88 85  BUN 8 - 27 mg/dL 17 23 20   Creatinine 0.57 - 1.00 mg/dL 0.86 0.84 0.88  Sodium 134 - 144 mmol/L 142 139 144  Potassium 3.5 - 5.2 mmol/L 4.0 4.4 4.4  Chloride 96 - 106 mmol/L 99 99 102  CO2 20 - 29 mmol/L 25 25 27   Calcium 8.7 - 10.3 mg/dL 9.3 9.6 9.8  Total Protein 6.0 - 8.5 g/dL 7.6 7.6 7.0  Total Bilirubin 0.0 - 1.2 mg/dL 0.7 0.7 0.5  Alkaline Phos 39 - 117 IU/L 94 88 88  AST 0 - 40 IU/L 31 28 52(H)  ALT 0 - 32 IU/L 38(H) 41(H) 75(H)   CBC Latest Ref Rng & Units 11/10/2018 01/30/2018 08/23/2015  WBC 3.4 - 10.8 x10E3/uL 5.3 - 4.0  Hemoglobin 11.1 - 15.9 g/dL 13.0 14.3 12.1  Hematocrit 34.0 - 46.6 % 39.7 42.0 37.4  Platelets 150 - 450 x10E3/uL 249 - 215   Lipid Panel      Component Value Date/Time   CHOL 144 11/10/2018 1228   TRIG 71 11/10/2018 1228   HDL 67 11/10/2018 1228   CHOLHDL 2.1 11/10/2018 1228   CHOLHDL 3.4 01/29/2009 2108   VLDL 9 01/29/2009 2108   LDLCALC 63 11/10/2018 1228   HEMOGLOBIN A1C Lab Results  Component Value Date   HGBA1C 5.6 11/10/2018   TSH Recent Labs    06/11/18 1008  TSH 0.798    PRN Meds:. Medications Discontinued During This Encounter  Medication Reason  . temazepam (  RESTORIL) 15 MG capsule Patient Preference   Current Meds  Medication Sig  . aspirin 81 MG tablet Take 81 mg by mouth daily.  Marland Kitchen. CALCIUM PO Take 1 tablet by mouth daily.  . Cholecalciferol (VITAMIN D3) 125 MCG (5000 UT) CAPS Take by mouth.  . Cyanocobalamin (B-12 PO) Take 1 tablet by mouth daily.  Marland Kitchen. EPINEPHRINE 0.3 mg/0.3 mL IJ SOAJ injection INJECT AS DIRECTED FOR ANAPHYLAXIS SHOCK  . hydrochlorothiazide (MICROZIDE) 12.5 MG capsule TAKE 1 CAPSULE BY MOUTH EVERY DAY  . lisinopril (ZESTRIL) 10 MG tablet TAKE 1 TABLET BY MOUTH DAILY (Patient taking differently: 1/2 tablet daily)  . magnesium oxide (MAG-OX) 400 MG tablet Take 400 mg by mouth daily.  Marland Kitchen. OZEMPIC, 1 MG/DOSE, 2 MG/1.5ML SOPN INJECT 1MG  INTO THE SKIN ONCE A WEEK.  Marland Kitchen. POTASSIUM PO Take 1 tablet by mouth daily.  . Prenatal Vit-Fe Fumarate-FA (PRENATAL PO) Take 1 tablet by mouth daily.  . rosuvastatin (CRESTOR) 5 MG tablet TAKE 1 TABLET BY MOUTH EVERY DAY    Cardiac Studies:     Assessment:     ICD-10-CM   1. Abnormal EKG  R94.31 EKG 12-Lead    PCV ECHOCARDIOGRAM COMPLETE    PCV MYOCARDIAL PERFUSION WITH LEXISCAN  2. Essential hypertension  I10 PCV ECHOCARDIOGRAM COMPLETE  3. Prediabetes  R73.03   4. Obesity (BMI 30.0-34.9)  E66.9   5. Pure hypercholesterolemia  E78.00     EKG 11/19/2018: Normal sinus rhythm at 63 bpm, normal axis, LVH.T wave inversion in anterolateral leads, cannot exclude ischemia. Abnormal EKG.   Recommendations:   Patient is a pleasant 63 year old female  with hypertension, prediabetes, hyperlipidemia, obesity status post gastric bypass surgery around 2014-2015 referred to us for evaluation of abnormal EKG.  Patient is without symptoms and physical exam is unremarkable.  She has newly noted T wave inversions in anterolateral leads.  EKG abnormalities are possibly related to hypertension; however, in view of her risk factors for CAD, this should be excluded.  We will schedule for Lexiscan nuclear stress test for further evaluation.  We will also obtain echocardiogram to exclude any structural abnormalities.  Hyperlipidemia and diabetes are well controlled.  Her blood pressure is slightly elevated in our office today, but she monitors regularly at home and is always very well controlled.  I have encouraged her to start daily monitoring and to keep a record of this to bring to her next appointment for further evaluation.  No changes were made to medications today.    I am extremely proud that she has been able to essentially maintain her weight loss since bypass surgery and I have congratulated her on this.  She has recently gained 15 pounds.  I have stressed the importance of working to get her recent weight gain back off to prevent continued weight gain.  We have set a goal of losing 4 to 5 pounds over the next 1 month before her next office visit.  Encouraged her to contact me sooner if needed, we will see her back after the test for further recommendations and reevaluation.   *I have discussed this case with Dr. Jacinto HalimGanji and he personally examined the patient and participated in formulating the plan.*   Toniann FailAshton Haynes Shavonda Wiedman, MSN, APRN, FNP-C [facility] Office: [phone] Fax: [phone]

## 2018-11-28 ENCOUNTER — Other Ambulatory Visit: Payer: Self-pay

## 2018-11-28 ENCOUNTER — Ambulatory Visit (INDEPENDENT_AMBULATORY_CARE_PROVIDER_SITE_OTHER): Payer: BC Managed Care – PPO

## 2018-11-28 DIAGNOSIS — R9431 Abnormal electrocardiogram [ECG] [EKG]: Secondary | ICD-10-CM

## 2018-12-19 ENCOUNTER — Other Ambulatory Visit: Payer: BC Managed Care – PPO

## 2018-12-25 ENCOUNTER — Ambulatory Visit: Payer: BC Managed Care – PPO | Admitting: Cardiology

## 2019-02-11 ENCOUNTER — Other Ambulatory Visit: Payer: Self-pay | Admitting: Internal Medicine

## 2019-03-16 ENCOUNTER — Ambulatory Visit: Payer: BC Managed Care – PPO | Admitting: Internal Medicine

## 2019-04-25 ENCOUNTER — Other Ambulatory Visit: Payer: Self-pay | Admitting: Internal Medicine

## 2019-05-04 ENCOUNTER — Encounter: Payer: Self-pay | Admitting: Internal Medicine

## 2019-05-04 ENCOUNTER — Other Ambulatory Visit: Payer: Self-pay

## 2019-05-04 ENCOUNTER — Ambulatory Visit: Payer: BC Managed Care – PPO | Admitting: Internal Medicine

## 2019-05-04 VITALS — BP 144/66 | HR 72 | Temp 98.6°F | Ht 62.0 in | Wt 200.0 lb

## 2019-05-04 DIAGNOSIS — I10 Essential (primary) hypertension: Secondary | ICD-10-CM

## 2019-05-04 DIAGNOSIS — E66812 Obesity, class 2: Secondary | ICD-10-CM

## 2019-05-04 DIAGNOSIS — Z23 Encounter for immunization: Secondary | ICD-10-CM

## 2019-05-04 DIAGNOSIS — E785 Hyperlipidemia, unspecified: Secondary | ICD-10-CM | POA: Diagnosis not present

## 2019-05-04 DIAGNOSIS — E1169 Type 2 diabetes mellitus with other specified complication: Secondary | ICD-10-CM | POA: Diagnosis not present

## 2019-05-04 DIAGNOSIS — Z6836 Body mass index (BMI) 36.0-36.9, adult: Secondary | ICD-10-CM

## 2019-05-04 MED ORDER — OZEMPIC (1 MG/DOSE) 2 MG/1.5ML ~~LOC~~ SOPN: 1.0000 mg | PEN_INJECTOR | SUBCUTANEOUS | 1 refills | Status: DC

## 2019-05-04 NOTE — Progress Notes (Signed)
This visit occurred during the SARS-CoV-2 public health emergency.  Safety protocols were in place, including screening questions prior to the visit, additional usage of staff PPE, and extensive cleaning of exam room while observing appropriate contact time as indicated for disinfecting solutions.  Subjective:     Patient ID: Teresa Newton , female    DOB: 10-14-1955 , 63 y.o.   MRN: [id number]   Chief Complaint  Patient presents with  . Hypertension  . Hyperlipidemia  . Immunizations    Tdap    HPI  She is here today for bp check. Admits she has yet to take her meds. Also admits to rushing into her appt this morning. She adds that she has been exercising regularly. She walks 2-5 miles five days per week. She has been working in [employer] for the past several months.   Hypertension This is a chronic problem. The current episode started more than 1 year ago. The problem has been gradually improving since onset. The problem is controlled. Pertinent negatives include no blurred vision, chest pain, palpitations or shortness of breath. Risk factors for coronary artery disease include dyslipidemia, diabetes mellitus, obesity and sedentary lifestyle. The current treatment provides moderate improvement. Compliance problems include exercise.   Hyperlipidemia Pertinent negatives include no chest pain or shortness of breath.     Past Medical History:  Diagnosis Date  . Bimalleolar ankle fracture    right  . Hypertension   . LTBI (latent tuberculosis infection)   . Plaque psoriasis   . Pre-diabetes   . Sleep apnea    gastric bypass, lost 70lbs and no longer needs CPAP     Family History  Problem Relation Age of Onset  . Psoriasis Brother   . Tuberculosis Mother   . Heart Problems Mother   . Dementia Mother   . Stroke Mother   . Breast cancer Paternal Aunt 87  . Ovarian cancer Sister      Current Outpatient Medications:  .  aspirin 81 MG tablet, Take 81 mg by mouth daily., Disp: ,  Rfl:  .  CALCIUM PO, Take 1 tablet by mouth daily., Disp: , Rfl:  .  Cholecalciferol (VITAMIN D3) 125 MCG (5000 UT) CAPS, Take by mouth., Disp: , Rfl:  .  Cyanocobalamin (B-12 PO), Take 1 tablet by mouth daily., Disp: , Rfl:  .  EPINEPHRINE 0.3 mg/0.3 mL IJ SOAJ injection, INJECT AS DIRECTED FOR ANAPHYLAXIS SHOCK, Disp: 1 Device, Rfl: 2 .  hydrochlorothiazide (MICROZIDE) 12.5 MG capsule, TAKE 1 CAPSULE BY MOUTH EVERY DAY, Disp: 90 capsule, Rfl: 1 .  lisinopril (ZESTRIL) 10 MG tablet, TAKE 1 TABLET BY MOUTH DAILY (Patient taking differently: 1/2 tablet daily), Disp: 90 tablet, Rfl: 2 .  magnesium oxide (MAG-OX) 400 MG tablet, Take 400 mg by mouth daily., Disp: , Rfl:  .  POTASSIUM PO, Take 1 tablet by mouth daily., Disp: , Rfl:  .  Prenatal Vit-Fe Fumarate-FA (PRENATAL PO), Take 1 tablet by mouth daily., Disp: , Rfl:  .  rosuvastatin (CRESTOR) 5 MG tablet, TAKE 1 TABLET BY MOUTH EVERY DAY, Disp: 90 tablet, Rfl: 1 .  Semaglutide, 1 MG/DOSE, (OZEMPIC, 1 MG/DOSE,) 2 MG/1.5ML SOPN, Inject 1 mg into the skin once a week., Disp: 9 pen, Rfl: 1   Allergies  Allergen Reactions  . Fish Allergy Anaphylaxis  . Milk-Related Compounds Anaphylaxis    Goat Milk ONLY   . Penicillins Hives     Review of Systems  Constitutional: Negative.   Eyes: Negative for blurred  vision.  Respiratory: Negative.  Negative for shortness of breath.   Cardiovascular: Negative.  Negative for chest pain and palpitations.  Gastrointestinal: Negative.   Neurological: Negative.   Psychiatric/Behavioral: Negative.      Today's Vitals   05/04/19 0857  BP: (!) 144/66  Pulse: 72  Temp: 98.6 F (37 C)  TempSrc: Oral  Weight: 200 lb (90.7 kg)  Height: 5' 2"  (1.575 m)   Body mass index is 36.58 kg/m.   Objective:  Physical Exam Vitals and nursing note reviewed.  Constitutional:      Appearance: Normal appearance. She is obese.  HENT:     Head: Normocephalic and atraumatic.  Cardiovascular:     Rate and Rhythm:  Normal rate and regular rhythm.     Heart sounds: Normal heart sounds.  Pulmonary:     Effort: Pulmonary effort is normal.     Breath sounds: Normal breath sounds.  Skin:    General: Skin is warm.  Neurological:     General: No focal deficit present.     Mental Status: She is alert.  Psychiatric:        Mood and Affect: Mood normal.        Behavior: Behavior normal.         Assessment And Plan:     1. Essential hypertension, benign  Chronic, uncontrolled. She admits that she has not taken her meds today. She is encouraged to follow a low-sodium diet. I will check renal function today.  - BMP8+EGFR  2. Hyperlipidemia associated with type 2 diabetes mellitus (HCC)  Chronic. Chol reading from July 2020 was reviewed in full detail. Her LDL is at goal, 63. She will continue with current meds. I will check an a1c today. She will rto in July 2021 for her next physical examination.   - Hemoglobin A1c  3. Class 2 severe obesity due to excess calories with serious comorbidity and body mass index (BMI) of 36.0 to 36.9 in adult [facility])  She is encouraged to strive to lose ten percent of her body weight to decrease cardiac risk. She was congratulated on her exercise regimen and encouraged to keep up the great work.   4. Need for vaccination  - Tdap vaccine greater than or equal to 7yo IM        [name], MD    THE PATIENT IS ENCOURAGED TO PRACTICE SOCIAL DISTANCING DUE TO THE COVID-19 PANDEMIC.

## 2019-05-04 NOTE — Patient Instructions (Signed)
https://www.cdc.gov/vaccines/hcp/vis/vis-statements/tdap.pdf">  Tdap Vaccine (Tetanus, Diphtheria and Pertussis): What You Need to Know 1. Why get vaccinated? Tetanus, diphtheria and pertussis are very serious diseases. Tdap vaccine can protect us from these diseases. And, Tdap vaccine given to pregnant women can protect newborn babies against pertussis.. TETANUS (Lockjaw) is rare in the [place] today. It causes painful muscle tightening and stiffness, usually all over the body.  It can lead to tightening of muscles in the head and neck so you can't open your mouth, swallow, or sometimes even breathe. Tetanus kills about 1 out of 10 people who are infected even after receiving the best medical care. DIPHTHERIA is also rare in the [place] today. It can cause a thick coating to form in the back of the throat.  It can lead to breathing problems, heart failure, paralysis, and death. PERTUSSIS (Whooping Cough) causes severe coughing spells, which can cause difficulty breathing, vomiting and disturbed sleep.  It can also lead to weight loss, incontinence, and rib fractures. Up to 2 in 100 adolescents and 5 in 100 adults with pertussis are hospitalized or have complications, which could include pneumonia or death. These diseases are caused by bacteria. Diphtheria and pertussis are spread from person to person through secretions from coughing or sneezing. Tetanus enters the body through cuts, scratches, or wounds. Before vaccines, as many as 200,000 cases of diphtheria, 200,000 cases of pertussis, and hundreds of cases of tetanus, were reported in the [place] each year. Since vaccination began, reports of cases for tetanus and diphtheria have dropped by about 99% and for pertussis by about 80%. 2. Tdap vaccine Tdap vaccine can protect adolescents and adults from tetanus, diphtheria, and pertussis. One dose of Tdap is routinely given at age 11 or 12. People who did not get Tdap at that age  should get it as soon as possible. Tdap is especially important for healthcare professionals and anyone having close contact with a baby younger than 12 months. Pregnant women should get a dose of Tdap during every pregnancy, to protect the newborn from pertussis. Infants are most at risk for severe, life-threatening complications from pertussis. Another vaccine, called Td, protects against tetanus and diphtheria, but not pertussis. A Td booster should be given every 10 years. Tdap may be given as one of these boosters if you have never gotten Tdap before. Tdap may also be given after a severe cut or burn to prevent tetanus infection. Your doctor or the person giving you the vaccine can give you more information. Tdap may safely be given at the same time as other vaccines. 3. Some people should not get this vaccine  A person who has ever had a life-threatening allergic reaction after a previous dose of any diphtheria, tetanus or pertussis containing vaccine, OR has a severe allergy to any part of this vaccine, should not get Tdap vaccine. Tell the person giving the vaccine about any severe allergies.  Anyone who had coma or long repeated seizures within 7 days after a childhood dose of DTP or DTaP, or a previous dose of Tdap, should not get Tdap, unless a cause other than the vaccine was found. They can still get Td.  Talk to your doctor if you: ? have seizures or another nervous system problem, ? had severe pain or swelling after any vaccine containing diphtheria, tetanus or pertussis, ? ever had a condition called Guillain-Barr Syndrome (GBS), ? aren't feeling well on the day the shot is scheduled. 4. Risks With any medicine, including vaccines,   there is a chance of side effects. These are usually mild and go away on their own. Serious reactions are also possible but are rare. Most people who get Tdap vaccine do not have any problems with it. Mild problems following Tdap (Did not interfere  with activities)  Pain where the shot was given (about 3 in 4 adolescents or 2 in 3 adults)  Redness or swelling where the shot was given (about 1 person in 5)  Mild fever of at least 100.4F (up to about 1 in 25 adolescents or 1 in 100 adults)  Headache (about 3 or 4 people in 10)  Tiredness (about 1 person in 3 or 4)  Nausea, vomiting, diarrhea, stomach ache (up to 1 in 4 adolescents or 1 in 10 adults)  Chills, sore joints (about 1 person in 10)  Body aches (about 1 person in 3 or 4)  Rash, swollen glands (uncommon) Moderate problems following Tdap (Interfered with activities, but did not require medical attention)  Pain where the shot was given (up to 1 in 5 or 6)  Redness or swelling where the shot was given (up to about 1 in 16 adolescents or 1 in 12 adults)  Fever over 102F (about 1 in 100 adolescents or 1 in 250 adults)  Headache (about 1 in 7 adolescents or 1 in 10 adults)  Nausea, vomiting, diarrhea, stomach ache (up to 1 or 3 people in 100)  Swelling of the entire arm where the shot was given (up to about 1 in 500). Severe problems following Tdap (Unable to perform usual activities; required medical attention)  Swelling, severe pain, bleeding and redness in the arm where the shot was given (rare). Problems that could happen after any vaccine:  People sometimes faint after a medical procedure, including vaccination. Sitting or lying down for about 15 minutes can help prevent fainting, and injuries caused by a fall. Tell your doctor if you feel dizzy, or have vision changes or ringing in the ears.  Some people get severe pain in the shoulder and have difficulty moving the arm where a shot was given. This happens very rarely.  Any medication can cause a severe allergic reaction. Such reactions from a vaccine are very rare, estimated at fewer than 1 in a million doses, and would happen within a few minutes to a few hours after the vaccination. As with any medicine,  there is a very remote chance of a vaccine causing a serious injury or death. The safety of vaccines is always being monitored. For more information, visit: www.cdc.gov/vaccinesafety/ 5. What if there is a serious problem? What should I look for?  Look for anything that concerns you, such as signs of a severe allergic reaction, very high fever, or unusual behavior. Signs of a severe allergic reaction can include hives, swelling of the face and throat, difficulty breathing, a fast heartbeat, dizziness, and weakness. These would usually start a few minutes to a few hours after the vaccination. What should I do?  If you think it is a severe allergic reaction or other emergency that can't wait, call 9-1-1 or get the person to the nearest hospital. Otherwise, call your doctor.  Afterward, the reaction should be reported to the Vaccine Adverse Event Reporting System (VAERS). Your doctor might file this report, or you can do it yourself through the VAERS web site at www.vaers.hhs.gov, or by calling 1-800-822-7967. VAERS does not give medical advice. 6. The National Vaccine Injury Compensation Program The National Vaccine Injury Compensation Program (  VICP) is a federal program that was created to compensate people who may have been injured by certain vaccines. Persons who believe they may have been injured by a vaccine can learn about the program and about filing a claim by calling 1-800-338-2382 or visiting the VICP website at www.hrsa.gov/vaccinecompensation. There is a time limit to file a claim for compensation. 7. How can I learn more?  Ask your doctor. He or she can give you the vaccine package insert or suggest other sources of information.  Call your local or state health department.  Contact the Centers for Disease Control and Prevention (CDC): ? Call 1-800-232-4636 (1-800-CDC-INFO) or ? Visit CDC's website at www.cdc.gov/vaccines Vaccine Information Statement Tdap Vaccine (06/30/2013) This  information is not intended to replace advice given to you by your health care provider. Make sure you discuss any questions you have with your health care provider. Document Released: 10/23/2011 Document Revised: 12/09/2017 Document Reviewed: 12/09/2017 Elsevier Interactive Patient Education  2020 Elsevier Inc.  

## 2019-05-05 LAB — BMP8+EGFR
BUN/Creatinine Ratio: 18 (ref 12–28)
BUN: 16 mg/dL (ref 8–27)
CO2: 25 mmol/L (ref 20–29)
Calcium: 9.5 mg/dL (ref 8.7–10.3)
Chloride: 99 mmol/L (ref 96–106)
Creatinine, Ser: 0.87 mg/dL (ref 0.57–1.00)
GFR calc Af Amer: 82 mL/min/{1.73_m2} (ref >59)
GFR calc non Af Amer: 71 mL/min/{1.73_m2} (ref >59)
Glucose: 76 mg/dL (ref 65–99)
Potassium: 4.3 mmol/L (ref 3.5–5.2)
Sodium: 138 mmol/L (ref 134–144)

## 2019-05-05 LAB — HEMOGLOBIN A1C
Est. average glucose Bld gHb Est-mCnc: 120 mg/dL
Hgb A1c MFr Bld: 5.8 % — ABNORMAL HIGH (ref 4.8–5.6)

## 2019-05-05 LAB — HM PAP SMEAR

## 2019-05-25 ENCOUNTER — Other Ambulatory Visit: Payer: BC Managed Care – PPO

## 2019-07-28 ENCOUNTER — Other Ambulatory Visit: Payer: Self-pay | Admitting: Internal Medicine

## 2019-08-18 ENCOUNTER — Other Ambulatory Visit: Payer: Self-pay | Admitting: Internal Medicine

## 2019-09-03 ENCOUNTER — Telehealth: Payer: Self-pay

## 2019-09-03 NOTE — Telephone Encounter (Signed)
The pt was asked how she is feeling because she was just diagnosed with covid, if she has any shortness of breath or a fever.  The pt said that she feels fine no fever or shortness of breath.

## 2019-10-01 ENCOUNTER — Telehealth: Payer: Self-pay

## 2019-10-01 NOTE — Telephone Encounter (Signed)
Called to check on pt since passing of her husband. Pt stated that she is doing fine and does not need anything from [place] at this time .

## 2019-10-09 ENCOUNTER — Other Ambulatory Visit: Payer: Self-pay | Admitting: Internal Medicine

## 2019-10-09 DIAGNOSIS — Z1231 Encounter for screening mammogram for malignant neoplasm of breast: Secondary | ICD-10-CM

## 2019-10-31 ENCOUNTER — Other Ambulatory Visit: Payer: Self-pay | Admitting: Internal Medicine

## 2019-11-01 ENCOUNTER — Other Ambulatory Visit: Payer: Self-pay | Admitting: Internal Medicine

## 2019-11-03 ENCOUNTER — Ambulatory Visit: Payer: BC Managed Care – PPO

## 2019-11-04 ENCOUNTER — Other Ambulatory Visit: Payer: Self-pay

## 2019-11-04 ENCOUNTER — Ambulatory Visit
Admission: RE | Admit: 2019-11-04 | Discharge: 2019-11-04 | Disposition: A | Discharge status: Home / Self Care | Payer: BC Managed Care – PPO | Source: Ambulatory Visit | Attending: Internal Medicine | Admitting: Internal Medicine

## 2019-11-04 DIAGNOSIS — Z1231 Encounter for screening mammogram for malignant neoplasm of breast: Secondary | ICD-10-CM

## 2019-11-17 ENCOUNTER — Encounter: Payer: Self-pay | Admitting: Internal Medicine

## 2019-11-17 ENCOUNTER — Ambulatory Visit: Payer: BC Managed Care – PPO | Admitting: Internal Medicine

## 2019-11-17 ENCOUNTER — Other Ambulatory Visit: Payer: Self-pay

## 2019-11-17 VITALS — BP 110/74 | HR 57 | Temp 98.2°F | Ht 62.0 in | Wt 183.8 lb

## 2019-11-17 DIAGNOSIS — E785 Hyperlipidemia, unspecified: Secondary | ICD-10-CM | POA: Diagnosis not present

## 2019-11-17 DIAGNOSIS — E66811 Obesity, class 1: Secondary | ICD-10-CM

## 2019-11-17 DIAGNOSIS — E6609 Other obesity due to excess calories: Secondary | ICD-10-CM | POA: Diagnosis not present

## 2019-11-17 DIAGNOSIS — Z0000 Encounter for general adult medical examination without abnormal findings: Secondary | ICD-10-CM | POA: Diagnosis not present

## 2019-11-17 DIAGNOSIS — E1169 Type 2 diabetes mellitus with other specified complication: Secondary | ICD-10-CM | POA: Diagnosis not present

## 2019-11-17 DIAGNOSIS — Z6833 Body mass index (BMI) 33.0-33.9, adult: Secondary | ICD-10-CM

## 2019-11-17 LAB — POCT UA - MICROALBUMIN
Albumin/Creatinine Ratio, Urine, POC: <30
Creatinine, POC: 100 mg/dL
Microalbumin Ur, POC: 10 mg/L

## 2019-11-17 LAB — POCT URINALYSIS DIPSTICK
Bilirubin, UA: NEGATIVE
Glucose, UA: NEGATIVE
Ketones, UA: NEGATIVE
Nitrite, UA: NEGATIVE
Protein, UA: NEGATIVE
Spec Grav, UA: 1.02
Urobilinogen, UA: 0.2 U/dL
pH, UA: 5.5

## 2019-11-17 NOTE — Patient Instructions (Signed)
Health Maintenance, Female Adopting a healthy lifestyle and getting preventive care are important in promoting health and wellness. Ask your health care provider about:  The right schedule for you to have regular tests and exams.  Things you can do on your own to prevent diseases and keep yourself healthy. What should I know about diet, weight, and exercise? Eat a healthy diet   Eat a diet that includes plenty of vegetables, fruits, low-fat dairy products, and lean protein.  Do not eat a lot of foods that are high in solid fats, added sugars, or sodium. Maintain a healthy weight Body mass index (BMI) is used to identify weight problems. It estimates body fat based on height and weight. Your health care provider can help determine your BMI and help you achieve or maintain a healthy weight. Get regular exercise Get regular exercise. This is one of the most important things you can do for your health. Most adults should:  Exercise for at least 150 minutes each week. The exercise should increase your heart rate and make you sweat (moderate-intensity exercise).  Do strengthening exercises at least twice a week. This is in addition to the moderate-intensity exercise.  Spend less time sitting. Even light physical activity can be beneficial. Watch cholesterol and blood lipids Have your blood tested for lipids and cholesterol at 64 years of age, then have this test every 5 years. Have your cholesterol levels checked more often if:  Your lipid or cholesterol levels are high.  You are older than 64 years of age.  You are at high risk for heart disease. What should I know about cancer screening? Depending on your health history and family history, you may need to have cancer screening at various ages. This may include screening for:  Breast cancer.  Cervical cancer.  Colorectal cancer.  Skin cancer.  Lung cancer. What should I know about heart disease, diabetes, and high blood  pressure? Blood pressure and heart disease  High blood pressure causes heart disease and increases the risk of stroke. This is more likely to develop in people who have high blood pressure readings, are of African descent, or are overweight.  Have your blood pressure checked: ? Every 3-5 years if you are 18-39 years of age. ? Every year if you are 40 years old or older. Diabetes Have regular diabetes screenings. This checks your fasting blood sugar level. Have the screening done:  Once every three years after age 40 if you are at a normal weight and have a low risk for diabetes.  More often and at a younger age if you are overweight or have a high risk for diabetes. What should I know about preventing infection? Hepatitis B If you have a higher risk for hepatitis B, you should be screened for this virus. Talk with your health care provider to find out if you are at risk for hepatitis B infection. Hepatitis C Testing is recommended for:  Everyone born from 1945 through 1965.  Anyone with known risk factors for hepatitis C. Sexually transmitted infections (STIs)  Get screened for STIs, including gonorrhea and chlamydia, if: ? You are sexually active and are younger than 64 years of age. ? You are older than 64 years of age and your health care provider tells you that you are at risk for this type of infection. ? Your sexual activity has changed since you were last screened, and you are at increased risk for chlamydia or gonorrhea. Ask your health care provider if   you are at risk.  Ask your health care provider about whether you are at high risk for HIV. Your health care provider may recommend a prescription medicine to help prevent HIV infection. If you choose to take medicine to prevent HIV, you should first get tested for HIV. You should then be tested every 3 months for as long as you are taking the medicine. Pregnancy  If you are about to stop having your period (premenopausal) and  you may become pregnant, seek counseling before you get pregnant.  Take 400 to 800 micrograms (mcg) of folic acid every day if you become pregnant.  Ask for birth control (contraception) if you want to prevent pregnancy. Osteoporosis and menopause Osteoporosis is a disease in which the bones lose minerals and strength with aging. This can result in bone fractures. If you are 65 years old or older, or if you are at risk for osteoporosis and fractures, ask your health care provider if you should:  Be screened for bone loss.  Take a calcium or vitamin D supplement to lower your risk of fractures.  Be given hormone replacement therapy (HRT) to treat symptoms of menopause. Follow these instructions at home: Lifestyle  Do not use any products that contain nicotine or tobacco, such as cigarettes, e-cigarettes, and chewing tobacco. If you need help quitting, ask your health care provider.  Do not use street drugs.  Do not share needles.  Ask your health care provider for help if you need support or information about quitting drugs. Alcohol use  Do not drink alcohol if: ? Your health care provider tells you not to drink. ? You are pregnant, may be pregnant, or are planning to become pregnant.  If you drink alcohol: ? Limit how much you use to 0-1 drink a day. ? Limit intake if you are breastfeeding.  Be aware of how much alcohol is in your drink. In the [place] one drink equals one 12 oz bottle of beer (355 mL), one 5 oz glass of wine (148 mL), or one 1 oz glass of hard liquor (44 mL). General instructions  Schedule regular health, dental, and eye exams.  Stay current with your vaccines.  Tell your health care provider if: ? You often feel depressed. ? You have ever been abused or do not feel safe at home. Summary  Adopting a healthy lifestyle and getting preventive care are important in promoting health and wellness.  Follow your health care provider's instructions about healthy  diet, exercising, and getting tested or screened for diseases.  Follow your health care provider's instructions on monitoring your cholesterol and blood pressure. This information is not intended to replace advice given to you by your health care provider. Make sure you discuss any questions you have with your health care provider. Document Revised: 04/16/2018 Document Reviewed: 04/16/2018 Elsevier Patient Education  2020 [employer] Inc[employer]  

## 2019-11-17 NOTE — Progress Notes (Signed)
This visit occurred during the SARS-CoV-2 public health emergency.  Safety protocols were in place, including screening questions prior to the visit, additional usage of staff PPE, and extensive cleaning of exam room while observing appropriate contact time as indicated for disinfecting solutions.  Subjective:     Patient ID: Teresa Newton , female    DOB: 17-Nov-1955 , 64 y.o.   MRN: [id number]   Chief Complaint  Patient presents with  . Annual Exam  . Diabetes  . Hypertension    HPI  She is here today for a full physical exam. She is followed by Dr. Garwin Brothers for her GYN exams. She has no specific concerns at this time.   Diabetes She presents for her follow-up diabetic visit. She has type 2 diabetes mellitus. Her disease course has been stable. There are no hypoglycemic associated symptoms. Pertinent negatives for diabetes include no blurred vision, no chest pain, no fatigue (she c/o fatigue. states she is not sleeping well. she is not sure why. denies change in sleeping/eating habits. ), no foot paresthesias, no polydipsia and no polyphagia. There are no hypoglycemic complications. Risk factors for coronary artery disease include diabetes mellitus, dyslipidemia, hypertension, obesity, post-menopausal and sedentary lifestyle. She is following a generally healthy and diabetic diet. She never participates in exercise. Her breakfast blood glucose is taken between 7-8 am. Her breakfast blood glucose range is generally 90-110 mg/dl. An ACE inhibitor/angiotensin II receptor blocker is being taken. Eye exam is not current.  Hypertension This is a chronic problem. The current episode started more than 1 year ago. The problem has been gradually improving since onset. The problem is controlled. Pertinent negatives include no blurred vision, chest pain, malaise/fatigue, palpitations or shortness of breath. Risk factors for coronary artery disease include post-menopausal state, obesity, sedentary  lifestyle, dyslipidemia and diabetes mellitus. The current treatment provides moderate improvement.     Past Medical History:  Diagnosis Date  . Bimalleolar ankle fracture    right  . Hypertension   . LTBI (latent tuberculosis infection)   . Plaque psoriasis   . Pre-diabetes   . Sleep apnea    gastric bypass, lost 70lbs and no longer needs CPAP     Family History  Problem Relation Age of Onset  . Psoriasis Brother   . Tuberculosis Mother   . Heart Problems Mother   . Dementia Mother   . Stroke Mother   . Breast cancer Paternal Aunt 55  . Ovarian cancer Sister      Current Outpatient Medications:  .  aspirin 81 MG tablet, Take 81 mg by mouth daily., Disp: , Rfl:  .  CALCIUM PO, Take 1 tablet by mouth daily., Disp: , Rfl:  .  Cholecalciferol (VITAMIN D3) 125 MCG (5000 UT) CAPS, Take by mouth., Disp: , Rfl:  .  Cyanocobalamin (B-12 PO), Take 1 tablet by mouth daily., Disp: , Rfl:  .  EPINEPHRINE 0.3 mg/0.3 mL IJ SOAJ injection, INJECT AS DIRECTED FOR ANAPHYLAXIS SHOCK, Disp: 1 Device, Rfl: 2 .  hydrochlorothiazide (MICROZIDE) 12.5 MG capsule, TAKE 1 CAPSULE BY MOUTH EVERY DAY, Disp: 90 capsule, Rfl: 1 .  lisinopril (ZESTRIL) 10 MG tablet, Take 0.5 tablets (5 mg total) by mouth daily. 1/2 tablet daily, Disp: 90 tablet, Rfl: 2 .  magnesium oxide (MAG-OX) 400 MG tablet, Take 400 mg by mouth daily., Disp: , Rfl:  .  OZEMPIC, 1 MG/DOSE, 2 MG/1.5ML SOPN, INJECT 1 MG INTO THE SKIN ONCE A WEEK., Disp: 3 pen, Rfl: 1 .  POTASSIUM PO, Take 1 tablet by mouth daily., Disp: , Rfl:  .  Prenatal Vit-Fe Fumarate-FA (PRENATAL PO), Take 1 tablet by mouth daily., Disp: , Rfl:  .  rosuvastatin (CRESTOR) 5 MG tablet, TAKE 1 TABLET BY MOUTH EVERY DAY, Disp: 90 tablet, Rfl: 1   Allergies  Allergen Reactions  . Fish Allergy Anaphylaxis    Cod fish   . Milk-Related Compounds Anaphylaxis    Goat Milk ONLY   . Penicillins Hives    The patient states she uses post menopausal status for birth  control. Last LMP was No LMP recorded. Patient is postmenopausal.. Negative for Dysmenorrhea  Negative for: breast discharge, breast lump(s), breast pain and breast self exam. Associated symptoms include abnormal vaginal bleeding. Pertinent negatives include abnormal bleeding (hematology), anxiety, decreased libido, depression, difficulty falling sleep, dyspareunia, history of infertility, nocturia, sexual dysfunction, sleep disturbances, urinary incontinence, urinary urgency, vaginal discharge and vaginal itching. Diet regular.The patient states her exercise level is    . The patient's tobacco use is:  Social History   Tobacco Use  Smoking Status Former Smoker  . Packs/day: 0.50  . Years: 3.00  . Pack years: 1.50  . Quit date: 21  . Years since quitting: 42.5  Smokeless Tobacco Never Used  . She has been exposed to passive smoke. The patient's alcohol use is:  Social History   Substance and Sexual Activity  Alcohol Use No  . Alcohol/week: 0.0 standard drinks   Review of Systems  Constitutional: Negative.  Negative for fatigue (she c/o fatigue. states she is not sleeping well. she is not sure why. denies change in sleeping/eating habits. ) and malaise/fatigue.  HENT: Negative.   Eyes: Negative.  Negative for blurred vision.  Respiratory: Negative.  Negative for shortness of breath.   Cardiovascular: Negative.  Negative for chest pain and palpitations.  Gastrointestinal: Negative.   Genitourinary: Negative.   Musculoskeletal: Negative.   Skin: Negative.   Neurological: Negative.   Endo/Heme/Allergies: Negative.  Negative for polydipsia and polyphagia.  Psychiatric/Behavioral: Negative.    #########################SHE DENIES FATIGUE#########################(glitch)  Today's Vitals   11/17/19 1034  BP: 110/74  Pulse: (!) 57  Temp: 98.2 F (36.8 C)  TempSrc: Oral  Weight: 183 lb 12.8 oz (83.4 kg)  Height: 5' 2"  (1.575 m)   Body mass index is 33.62 kg/m.   Wt Readings from  Last 3 Encounters:  11/17/19 183 lb 12.8 oz (83.4 kg)  05/04/19 200 lb (90.7 kg)  11/19/18 199 lb (90.3 kg)    Objective:  Physical Exam Vitals and nursing note reviewed.  Constitutional:      Appearance: Normal appearance.  HENT:     Head: Normocephalic and atraumatic.     Right Ear: Tympanic membrane, ear canal and external ear normal.     Left Ear: Tympanic membrane, ear canal and external ear normal.     Nose:     Comments: Deferred, masked    Mouth/Throat:     Comments: Deferred, masked Eyes:     Extraocular Movements: Extraocular movements intact.     Conjunctiva/sclera: Conjunctivae normal.     Pupils: Pupils are equal, round, and reactive to light.  Cardiovascular:     Rate and Rhythm: Normal rate and regular rhythm.     Pulses: Normal pulses.          Dorsalis pedis pulses are 2+ on the right side and 2+ on the left side.       Posterior tibial pulses are 2+ on the right side  and 2+ on the left side.     Heart sounds: Normal heart sounds.  Pulmonary:     Effort: Pulmonary effort is normal.     Breath sounds: Normal breath sounds.  Chest:     Breasts: Tanner Score is 5.     Comments: She elected to keep her bra on Abdominal:     General: Abdomen is flat. Bowel sounds are normal.     Palpations: Abdomen is soft.  Genitourinary:    Comments: deferred Musculoskeletal:        General: Normal range of motion.     Cervical back: Normal range of motion and neck supple.  Feet:     Right foot:     Protective Sensation: 5 sites tested. 5 sites sensed.     Skin integrity: Skin integrity normal.     Toenail Condition: Right toenails are normal.     Left foot:     Protective Sensation: 5 sites tested. 5 sites sensed.     Skin integrity: Skin integrity normal.     Toenail Condition: Left toenails are normal.  Skin:    General: Skin is warm and dry.  Neurological:     General: No focal deficit present.     Mental Status: She is alert and oriented to person, place, and  time.  Psychiatric:        Mood and Affect: Mood normal.        Behavior: Behavior normal.         Assessment And Plan:     1. Encounter for annual physical exam  A full exam was performed. Importance of monthly self breast exams was discussed with the patient. PATIENT IS ADVISED TO GET 30-45 MINUTES REGULAR EXERCISE NO LESS THAN FOUR TO FIVE DAYS PER WEEK - BOTH WEIGHTBEARING EXERCISES AND AEROBIC ARE RECOMMENDED.  PATIENT IS ADVISED TO FOLLOW A HEALTHY DIET WITH AT LEAST SIX FRUITS/VEGGIES PER DAY, DECREASE INTAKE OF RED MEAT, AND TO INCREASE FISH INTAKE TO TWO DAYS PER WEEK.  MEATS/FISH SHOULD NOT BE FRIED, BAKED OR BROILED IS PREFERABLE.  I SUGGEST WEARING SPF 50 SUNSCREEN ON EXPOSED PARTS AND ESPECIALLY WHEN IN THE DIRECT SUNLIGHT FOR AN EXTENDED PERIOD OF TIME.  PLEASE AVOID FAST FOOD RESTAURANTS AND INCREASE YOUR WATER INTAKE.  - EKG 12-Lead - POCT Urinalysis Dipstick (81002) - POCT UA - Microalbumin - CBC no Diff - Hemoglobin A1c - Lipid panel - CMP14+EGFR  2. Hyperlipidemia associated with type 2 diabetes mellitus ([place])  Chronic, she is encouraged Diabetic foot exam was performed.  I DISCUSSED WITH THE PATIENT AT LENGTH REGARDING THE GOALS OF GLYCEMIC CONTROL AND POSSIBLE LONG-TERM COMPLICATIONS.  I  ALSO STRESSED THE IMPORTANCE OF COMPLIANCE WITH HOME GLUCOSE MONITORING, DIETARY RESTRICTIONS INCLUDING AVOIDANCE OF SUGARY DRINKS/PROCESSED FOODS,  ALONG WITH REGULAR EXERCISE.  I  ALSO STRESSED THE IMPORTANCE OF ANNUAL EYE EXAMS, SELF FOOT CARE AND COMPLIANCE WITH OFFICE VISITS.  - EKG 12-Lead - POCT Urinalysis Dipstick (81002) - POCT UA - Microalbumin  3. Class 1 obesity due to excess calories with serious comorbidity and body mass index (BMI) of 33.0 to 33.9 in adult  She was congratulated on her 17 pound weight loss thus far and encouraged to keep up the great work. She has an appt at [facility] in [place] later today.    [name], MD   I, [name],  MD, have reviewed all documentation for this visit. The documentation on 11/17/19 for the exam, diagnosis, procedures, and  orders are all accurate and complete.  THE PATIENT IS ENCOURAGED TO PRACTICE SOCIAL DISTANCING DUE TO THE COVID-19 PANDEMIC.

## 2019-11-18 LAB — CMP14+EGFR
ALT: 70 IU/L — ABNORMAL HIGH (ref 0–32)
AST: 60 IU/L — ABNORMAL HIGH (ref 0–40)
Albumin/Globulin Ratio: 1.4 (ref 1.2–2.2)
Albumin: 4.3 g/dL (ref 3.8–4.8)
Alkaline Phosphatase: 77 IU/L (ref 48–121)
BUN/Creatinine Ratio: 25 (ref 12–28)
BUN: 21 mg/dL (ref 8–27)
Bilirubin Total: 0.5 mg/dL (ref 0.0–1.2)
CO2: 28 mmol/L (ref 20–29)
Calcium: 9.1 mg/dL (ref 8.7–10.3)
Chloride: 101 mmol/L (ref 96–106)
Creatinine, Ser: 0.83 mg/dL (ref 0.57–1.00)
GFR calc Af Amer: 87 mL/min/{1.73_m2} (ref >59)
GFR calc non Af Amer: 75 mL/min/{1.73_m2} (ref >59)
Globulin, Total: 3.1 g/dL (ref 1.5–4.5)
Glucose: 82 mg/dL (ref 65–99)
Potassium: 3.9 mmol/L (ref 3.5–5.2)
Sodium: 141 mmol/L (ref 134–144)
Total Protein: 7.4 g/dL (ref 6.0–8.5)

## 2019-11-18 LAB — LIPID PANEL
Chol/HDL Ratio: 2.3 ratio (ref 0.0–4.4)
Cholesterol, Total: 138 mg/dL (ref 100–199)
HDL: 60 mg/dL (ref >39)
LDL Chol Calc (NIH): 65 mg/dL (ref 0–99)
Triglycerides: 63 mg/dL (ref 0–149)
VLDL Cholesterol Cal: 13 mg/dL (ref 5–40)

## 2019-11-18 LAB — CBC
Hematocrit: 37.5 % (ref 34.0–46.6)
Hemoglobin: 12.3 g/dL (ref 11.1–15.9)
MCH: 30.8 pg (ref 26.6–33.0)
MCHC: 32.8 g/dL (ref 31.5–35.7)
MCV: 94 fL (ref 79–97)
Platelets: 217 10*3/uL (ref 150–450)
RBC: 4 x10E6/uL (ref 3.77–5.28)
RDW: 13 % (ref 11.7–15.4)
WBC: 4.5 10*3/uL (ref 3.4–10.8)

## 2019-11-18 LAB — HEMOGLOBIN A1C
Est. average glucose Bld gHb Est-mCnc: 117 mg/dL
Hgb A1c MFr Bld: 5.7 % — ABNORMAL HIGH (ref 4.8–5.6)

## 2019-12-31 ENCOUNTER — Encounter: Payer: Self-pay | Admitting: Internal Medicine

## 2020-01-13 ENCOUNTER — Encounter: Payer: Self-pay | Admitting: Internal Medicine

## 2020-02-15 ENCOUNTER — Encounter: Payer: Self-pay | Admitting: Nurse Practitioner

## 2020-02-15 ENCOUNTER — Other Ambulatory Visit: Payer: Self-pay

## 2020-02-15 ENCOUNTER — Telehealth (INDEPENDENT_AMBULATORY_CARE_PROVIDER_SITE_OTHER): Payer: BC Managed Care – PPO | Admitting: Nurse Practitioner

## 2020-02-15 DIAGNOSIS — R5383 Other fatigue: Secondary | ICD-10-CM | POA: Diagnosis not present

## 2020-02-15 DIAGNOSIS — R197 Diarrhea, unspecified: Secondary | ICD-10-CM | POA: Diagnosis not present

## 2020-02-15 DIAGNOSIS — R110 Nausea: Secondary | ICD-10-CM

## 2020-02-15 NOTE — Patient Instructions (Signed)

## 2020-02-15 NOTE — Progress Notes (Signed)
Virtual Visit via MyChart   This visit type was conducted due to national recommendations for restrictions regarding the COVID-19 Pandemic (e.g. social distancing) in an effort to limit this patient's exposure and mitigate transmission in our community.  Due to her co-morbid illnesses, this patient is at least at moderate risk for complications without adequate follow up.  This format is felt to be most appropriate for this patient at this time.  All issues noted in this document were discussed and addressed.  A limited physical exam was performed with this format.    This visit type was conducted due to national recommendations for restrictions regarding the COVID-19 Pandemic (e.g. social distancing) in an effort to limit this patient's exposure and mitigate transmission in our community.  Patients identity confirmed using two different identifiers.  This format is felt to be most appropriate for this patient at this time.  All issues noted in this document were discussed and addressed.  No physical exam was performed (except for noted visual exam findings with Video Visits).    Date:  02/15/2020   ID:  Teresa Newton, DOB [birth year], MRN [id number]  Patient Location:  Home - spoke with Lenox Ponds  Provider location:   Office    Chief Complaint:  Diarrhea, nausea and muscle cramping.    History of Present Illness:    Teresa Newton is a 64 y.o. female who presents via video conferencing for a telehealth visit today.    The patient does not have symptoms concerning for COVID-19 infection (fever, chills, cough, or new shortness of breath).   She is reporting having a sudden onset of nausea, low blood sugar and diarrhea.  This has occurred before but did not get seen about 3 weeks ago.  She feels like all of her energy is gone.  She reports her energy has been decreased since May 2021.  She is a vegetarian.  She is eating fish/salmon mostly.  She "felt like her blood sugar was low but  did not test it". She felt dizzy and felt bad.  She is taking magnesium, prenatal vitamins, vitamin d, potassium, aspirin, B12, liver focus.    Today having nausea, diarrhea, cramps to her legs and feet.  Denies fever. Yesterday she had shortness of breath (for about 5 minutes) and a headache. She did eat a banana and a snickers bar before she started driving.    She reports when she eats she will get nauseated regardless of what she eats.  She is due for her colonoscopy she has a family history of colon cancer.  She has also had gastric bypass. Denies taking any medications for nausea.  She has had some nasal drainage lately.  Has not been coughing.  She is fully vaccinated. She drives a transit bus.    She is scheduled to see Dr. Benson Norway.      Past Medical History:  Diagnosis Date  . Bimalleolar ankle fracture    right  . Hypertension   . LTBI (latent tuberculosis infection)   . Plaque psoriasis   . Pre-diabetes   . Sleep apnea    gastric bypass, lost 70lbs and no longer needs CPAP   Past Surgical History:  Procedure Laterality Date  . COLONOSCOPY    . FOOT SURGERY Right   . GASTRIC BYPASS    . ORIF ANKLE FRACTURE Right 01/30/2018   Procedure: OPEN REDUCTION INTERNAL FIXATION (ORIF) right ankle trimalleolar fracture;  Surgeon: Wylene Simmer, MD;  Location: [facility];  Service: Orthopedics;  Laterality: Right;  . TUBAL LIGATION       Current Meds  Medication Sig  . aspirin 81 MG tablet Take 81 mg by mouth daily.  Marland Kitchen CALCIUM PO Take 1 tablet by mouth daily.  . Cholecalciferol (VITAMIN D3) 125 MCG (5000 UT) CAPS Take by mouth.  . Cyanocobalamin (B-12 PO) Take 1 tablet by mouth daily.  Marland Kitchen EPINEPHRINE 0.3 mg/0.3 mL IJ SOAJ injection INJECT AS DIRECTED FOR ANAPHYLAXIS SHOCK  . hydrochlorothiazide (MICROZIDE) 12.5 MG capsule TAKE 1 CAPSULE BY MOUTH EVERY DAY  . lisinopril (ZESTRIL) 10 MG tablet Take 0.5 tablets (5 mg total) by mouth daily. 1/2 tablet daily  . magnesium  oxide (MAG-OX) 400 MG tablet Take 400 mg by mouth daily.  Marland Kitchen POTASSIUM PO Take 1 tablet by mouth daily.  . Prenatal Vit-Fe Fumarate-FA (PRENATAL PO) Take 1 tablet by mouth daily.  . rosuvastatin (CRESTOR) 5 MG tablet TAKE 1 TABLET BY MOUTH EVERY DAY     Allergies:   Fish allergy, Milk-related compounds, and Penicillins   Social History   Tobacco Use  . Smoking status: Former Smoker    Packs/day: 0.50    Years: 3.00    Pack years: 1.50    Quit date: 1979    Years since quitting: 42.8  . Smokeless tobacco: Never Used  Vaping Use  . Vaping Use: Never used  Substance Use Topics  . Alcohol use: No    Alcohol/week: 0.0 standard drinks  . Drug use: No     Family Hx: The patient's family history includes Breast cancer (age of onset: 97) in her paternal aunt; Dementia in her mother; Heart Problems in her mother; Ovarian cancer in her sister; Psoriasis in her brother; Stroke in her mother; Tuberculosis in her mother.  ROS:   Please see the history of present illness.    Review of Systems  Constitutional: Positive for malaise/fatigue.  Neurological: Negative for dizziness and tingling.  Psychiatric/Behavioral: Negative.     All other systems reviewed and are negative.   Labs/Other Tests and Data Reviewed:    Recent Labs: 11/17/2019: ALT 70; BUN 21; Creatinine, Ser 0.83; Hemoglobin 12.3; Platelets 217; Potassium 3.9; Sodium 141   Recent Lipid Panel Lab Results  Component Value Date/Time   CHOL 138 11/17/2019 12:03 PM   TRIG 63 11/17/2019 12:03 PM   HDL 60 11/17/2019 12:03 PM   CHOLHDL 2.3 11/17/2019 12:03 PM   CHOLHDL 3.4 01/29/2009 09:08 PM   LDLCALC 65 11/17/2019 12:03 PM    Wt Readings from Last 3 Encounters:  11/17/19 183 lb 12.8 oz (83.4 kg)  05/04/19 200 lb (90.7 kg)  11/19/18 199 lb (90.3 kg)     Exam:    Vital Signs:  There were no vitals taken for this visit.    Physical Exam Vitals reviewed.  Constitutional:      General: She is not in acute  distress.    Appearance: She is well-developed.  Pulmonary:     Effort: Pulmonary effort is normal. No respiratory distress.  Neurological:     Mental Status: She is alert.     ASSESSMENT & PLAN:    1. Nausea  Occurs mostly after eating  Will check for gallbladder and pancreatic enzyme levels  She does not feel she needs any medications to help with the nausea - Lipase - Amylase  2. Diarrhea, unspecified type  After eating as well, she is scheduled to have her consultation for her colonoscopy with Dr. Benson Norway  in the next few weeks  She will be obtaining a stool sample and electrolyte   Pending results she may need a CT scan of abdomen - CMP14+EGFR - CBC - Culture, Stool - Cdiff NAA+O+P+Stool Culture  3. Fatigue, unspecified type  Will check for metabolic cause. Also discussed with her could be related to the loss of her husband or lack of sleep.  - TSH - Vitamin B12   COVID-19 Education: The signs and symptoms of COVID-19 were discussed with the patient and how to seek care for testing (follow up with PCP or arrange E-visit).  The importance of social distancing was discussed today.  Patient Risk:   After full review of this patients clinical status, I feel that they are at least moderate risk at this time.  Time:   Today, I have spent 22 minutes/ seconds with the patient with telehealth technology discussing above diagnoses.     Medication Adjustments/Labs and Tests Ordered: Current medicines are reviewed at length with the patient today.  Concerns regarding medicines are outlined above.   Tests Ordered: Orders Placed This Encounter  Procedures  . Culture, Stool  . Cdiff NAA+O+P+Stool Culture  . CMP14+EGFR  . CBC  . Lipase  . Amylase  . TSH  . Vitamin B12    Medication Changes: No orders of the defined types were placed in this encounter.   Disposition:  Follow up prn  Signed, Minette Brine, FNP

## 2020-02-17 ENCOUNTER — Other Ambulatory Visit: Payer: Self-pay | Admitting: Internal Medicine

## 2020-03-10 LAB — HM COLONOSCOPY

## 2020-03-11 ENCOUNTER — Encounter: Payer: Self-pay | Admitting: Internal Medicine

## 2020-03-22 ENCOUNTER — Other Ambulatory Visit: Payer: Self-pay | Admitting: Cardiology

## 2020-03-22 DIAGNOSIS — R9431 Abnormal electrocardiogram [ECG] [EKG]: Secondary | ICD-10-CM

## 2020-03-22 DIAGNOSIS — I10 Essential (primary) hypertension: Secondary | ICD-10-CM

## 2020-03-23 ENCOUNTER — Ambulatory Visit: Payer: BC Managed Care – PPO | Admitting: Internal Medicine

## 2020-03-23 ENCOUNTER — Other Ambulatory Visit: Payer: Self-pay

## 2020-03-23 ENCOUNTER — Ambulatory Visit: Payer: BC Managed Care – PPO

## 2020-03-23 DIAGNOSIS — R9431 Abnormal electrocardiogram [ECG] [EKG]: Secondary | ICD-10-CM

## 2020-03-23 DIAGNOSIS — I10 Essential (primary) hypertension: Secondary | ICD-10-CM

## 2020-03-29 ENCOUNTER — Ambulatory Visit: Payer: BC Managed Care – PPO | Admitting: Cardiology

## 2020-03-29 ENCOUNTER — Encounter: Payer: Self-pay | Admitting: Cardiology

## 2020-03-29 ENCOUNTER — Encounter: Payer: Self-pay | Admitting: Internal Medicine

## 2020-03-29 ENCOUNTER — Other Ambulatory Visit: Payer: Self-pay

## 2020-03-29 VITALS — BP 136/76 | HR 80 | Ht 62.0 in | Wt 189.0 lb

## 2020-03-29 DIAGNOSIS — Z87891 Personal history of nicotine dependence: Secondary | ICD-10-CM

## 2020-03-29 DIAGNOSIS — I10 Essential (primary) hypertension: Secondary | ICD-10-CM

## 2020-03-29 DIAGNOSIS — R9431 Abnormal electrocardiogram [ECG] [EKG]: Secondary | ICD-10-CM

## 2020-03-29 DIAGNOSIS — E7800 Pure hypercholesterolemia, unspecified: Secondary | ICD-10-CM

## 2020-03-29 NOTE — Progress Notes (Signed)
Teresa ForehandEsther M Newton Date of Birth: 1956/03/09 MRN: [id number] Primary Care Provider:Sanders, Melina Schoolsobyn, MD Former Cardiology Providers: Altamese CarolinaAshton Kelley, APRN, FNP-C Primary Cardiologist: Tessa LernerSunit Patrina Andreas, DO, [facility] (established care 03/29/2020)  Date: 03/29/20 Last Visit: 11/19/2018  Chief Complaint  Patient presents with  . Abnormal ECG  . Follow-up    HPI  Teresa Newton is a 64 y.o.  female who presents to the office with a chief complaint of " history of abnormal EKG and a 1 year follow-up." Patient's past medical history and cardiovascular risk factors include: hypertension, hyperlipidemia, prediabetes, history of sleep apnea that resolved after 70 pound weight loss with gastric bypass surgery in 2015, former smoker.   Patient followed by Phineas SemenAshton and is here to reestablish care with myself as part of her 1 year follow-up for abnormal EKG.   For the last 1 year patient stated she is doing well from a cardiovascular standpoint.  No hospitalizations or urgent care visits from cardiovascular symptoms.  Since last office visit patient has lost her husband due to COVID infection, they were married for more than 40 years. Since than she has started to work for [employer] and is doing well overall.   EKG and prior testing reviewed with her at today's office visit.   ALLERGIES: Allergies  Allergen Reactions  . Fish Allergy Anaphylaxis    Cod fish   . Milk-Related Compounds Anaphylaxis    Goat Milk ONLY   . Penicillins Hives     MEDICATION LIST PRIOR TO VISIT: Current Outpatient Medications on File Prior to Visit  Medication Sig Dispense Refill  . aspirin 81 MG tablet Take 81 mg by mouth daily.    Marland Kitchen. CALCIUM PO Take 1 tablet by mouth daily.    . Cholecalciferol (VITAMIN D3) 125 MCG (5000 UT) CAPS Take by mouth.    . Cyanocobalamin (B-12 PO) Take 1 tablet by mouth daily.    Marland Kitchen. EPINEPHRINE 0.3 mg/0.3 mL IJ SOAJ injection INJECT AS DIRECTED FOR ANAPHYLAXIS SHOCK 1 Device 2  .  hydrochlorothiazide (MICROZIDE) 12.5 MG capsule TAKE 1 CAPSULE BY MOUTH EVERY DAY 90 capsule 1  . lisinopril (ZESTRIL) 10 MG tablet Take 0.5 tablets (5 mg total) by mouth daily. 1/2 tablet daily 90 tablet 2  . magnesium oxide (MAG-OX) 400 MG tablet Take 400 mg by mouth daily.    Marland Kitchen. omeprazole (PRILOSEC) 40 MG capsule Take 40 mg by mouth daily.    Marland Kitchen. OZEMPIC, 1 MG/DOSE, 4 MG/3ML SOPN Inject 1 mg as directed as directed.    Marland Kitchen. POTASSIUM PO Take 1 tablet by mouth daily.    . Prenatal Vit-Fe Fumarate-FA (PRENATAL PO) Take 1 tablet by mouth daily.    . rosuvastatin (CRESTOR) 5 MG tablet TAKE 1 TABLET BY MOUTH EVERY DAY 90 tablet 1   No current facility-administered medications on file prior to visit.    PAST MEDICAL HISTORY: Past Medical History:  Diagnosis Date  . Bimalleolar ankle fracture    right  . Hypertension   . LTBI (latent tuberculosis infection)   . Plaque psoriasis   . Pre-diabetes   . Sleep apnea    gastric bypass, lost 70lbs and no longer needs CPAP    PAST SURGICAL HISTORY: Past Surgical History:  Procedure Laterality Date  . COLONOSCOPY    . FOOT SURGERY Right   . GASTRIC BYPASS    . ORIF ANKLE FRACTURE Right 01/30/2018   Procedure: OPEN REDUCTION INTERNAL FIXATION (ORIF) right ankle trimalleolar fracture;  Surgeon: Toni ArthursHewitt, John, MD;  Location: [facility];  Service: Orthopedics;  Laterality: Right;  . TUBAL LIGATION      FAMILY HISTORY: The patient's family history includes Breast cancer (age of onset: 46) in her paternal aunt; Dementia in her mother; Heart Problems in her mother; Ovarian cancer in her sister; Psoriasis in her brother; Stroke in her mother; Tuberculosis in her mother.   SOCIAL HISTORY:  The patient  reports that she quit smoking about 42 years ago. She has a 1.50 pack-year smoking history. She has never used smokeless tobacco. She reports that she does not drink alcohol and does not use drugs.  Review of Systems  Constitutional:  Negative for chills and fever.  HENT: Negative for hoarse voice and nosebleeds.   Eyes: Negative for discharge, double vision and pain.  Cardiovascular: Negative for chest pain, claudication, dyspnea on exertion, leg swelling, near-syncope, orthopnea, palpitations, paroxysmal nocturnal dyspnea and syncope.  Respiratory: Negative for hemoptysis and shortness of breath.   Musculoskeletal: Negative for muscle cramps and myalgias.  Gastrointestinal: Negative for abdominal pain, constipation, diarrhea, hematemesis, hematochezia, melena, nausea and vomiting.  Neurological: Negative for dizziness and light-headedness.   PHYSICAL EXAM: Vitals with BMI 03/29/2020 02/15/2020 11/17/2019  Height 5\' 2"  (No Data) 5\' 2"   Weight 189 lbs (No Data) 183 lbs 13 oz  BMI 34.56 - 33.61  Systolic 136 (No Data) 110  Diastolic 76 (No Data) 74  Pulse 80 (No Data) 57    CONSTITUTIONAL: Well-developed and well-nourished. No acute distress.  SKIN: Skin is warm and dry. No rash noted. No cyanosis. No pallor. No jaundice HEAD: Normocephalic and atraumatic.  EYES: No scleral icterus MOUTH/THROAT: Moist oral membranes.  NECK: No JVD present. No thyromegaly noted. No carotid bruits  LYMPHATIC: No visible cervical adenopathy.  CHEST Normal respiratory effort. No intercostal retractions  LUNGS: Clear to auscultation bilaterally. No stridor. No wheezes. No rales.  CARDIOVASCULAR: Regular rate and rhythm, positive S1-S2, no murmurs rubs or gallops appreciated. ABDOMINAL: Obese, soft, nontender, nondistended, positive bowel sounds all 4 quadrants. No apparent ascites.  EXTREMITIES: No peripheral edema  HEMATOLOGIC: No significant bruising NEUROLOGIC: Oriented to person, place, and time. Nonfocal. Normal muscle tone.  PSYCHIATRIC: Normal mood and affect. Normal behavior. Cooperative  CARDIAC DATABASE: EKG: 03/3022/2021: Normal sinus rhythm, 65 bpm, normal axis,TWI V3-V5, without underlying injury pattern.  Compared to  prior EKG dated 11/17/2019 normal sinus rhythm with T wave inversions in leads V3-V4.  Echocardiogram: 03/23/2020: Normal LV systolic function with visual EF 60-65%. Left ventricle cavity is normal in size. Normal global wall motion. Normal diastolic filling pattern, normal LAP. Calculated EF 61%. No significant valvular abnormalities. No prior study for comparison.   Stress Testing:  Lexiscan Myoview stress test 11/28/2018: Lexiscan stress test was performed. Stress EKG is non-diagnostic, as this is pharmacological stress test. Myocardial perfusion imaging is normal. LVEF 77%. Low risk study.  Heart Catheterization: None  LABORATORY DATA: CBC Latest Ref Rng & Units 11/17/2019 11/10/2018 01/30/2018  WBC 3.4 - 10.8 x10E3/uL 4.5 5.3 -  Hemoglobin 11.1 - 15.9 g/dL 01/11/2019 02/01/2018 20.2  Hematocrit 34.0 - 46.6 % 37.5 39.7 42.0  Platelets 150 - 450 x10E3/uL 217 249 -    CMP Latest Ref Rng & Units 11/17/2019 05/04/2019 11/10/2018  Glucose 65 - 99 mg/dL 82 76 83  BUN 8 - 27 mg/dL 21 16 17   Creatinine 0.57 - 1.00 mg/dL 05/06/2019 01/11/2019  Sodium 134 - 144 mmol/L 141 138 142  Potassium 3.5 - 5.2 mmol/L 3.9 4.3 4.0  Chloride 96 - 106 mmol/L 101 99 99  CO2 20 - 29 mmol/L 28 25 25   Calcium 8.7 - 10.3 mg/dL 9.1 9.5 9.3  Total Protein 6.0 - 8.5 g/dL 7.4 - 7.6  Total Bilirubin 0.0 - 1.2 mg/dL 0.5 - 0.7  Alkaline Phos 48 - 121 IU/L 77 - 94  AST 0 - 40 IU/L 60(H) - 31  ALT 0 - 32 IU/L 70(H) - 38(H)    Lipid Panel  Lab Results  Component Value Date   CHOL 138 11/17/2019   HDL 60 11/17/2019   LDLCALC 65 11/17/2019   TRIG 63 11/17/2019   CHOLHDL 2.3 11/17/2019    Lab Results  Component Value Date   HGBA1C 5.7 (H) 11/17/2019   HGBA1C 5.8 (H) 05/04/2019   HGBA1C 5.6 11/10/2018   No components found for: NTPROBNP  Cardiac Panel (last 3 results) No results for input(s): CKTOTAL, CKMB, TROPONINIHS, RELINDX in the last 72 hours.  IMPRESSION:    ICD-10-CM   1. Abnormal EKG  R94.31 EKG 12-Lead  2.  Essential hypertension  I10   3. Pure hypercholesterolemia  E78.00   4. Former smoker  Z87.891      RECOMMENDATIONS: Teresa Newton is a 64 y.o. female whose past medical history and cardiovascular risk factors include: hypertension, hyperlipidemia, prediabetes, history of sleep apnea that resolved after 70 pound weight loss with gastric bypass surgery in 2015, former smoker.   From a cardiovascular standpoint patient has been doing well for the last 1 year as she does not complain of symptoms of chest pain or anginal equivalent. No recent hospitalizations or urgent care visits for cardiovascular symptoms.  EKG at today's office visit also notes normal sinus rhythm with T wave inversions in the lateral leads similar findings on prior ECGs.  She has undergone ischemic evaluation including an echocardiogram and stress test.  These results were reviewed with her at today's encounter.   Since last office visit her echocardiogram notes preserved LVEF, normal diastolic filling pattern, and no significant valvular heart disease.  Her blood pressure is within acceptable range.    And she is working on lifestyle modifications to improve her weight.  Educated on the importance of continued smoking cessation.  Patient chooses to follow-up on a as needed basis which is very appropriate as she has a stable from a cardiovascular standpoint since last encounter.   FINAL MEDICATION LIST END OF ENCOUNTER: No orders of the defined types were placed in this encounter.   There are no discontinued medications.   Current Outpatient Medications:  .  aspirin 81 MG tablet, Take 81 mg by mouth daily., Disp: , Rfl:  .  CALCIUM PO, Take 1 tablet by mouth daily., Disp: , Rfl:  .  Cholecalciferol (VITAMIN D3) 125 MCG (5000 UT) CAPS, Take by mouth., Disp: , Rfl:  .  Cyanocobalamin (B-12 PO), Take 1 tablet by mouth daily., Disp: , Rfl:  .  EPINEPHRINE 0.3 mg/0.3 mL IJ SOAJ injection, INJECT AS DIRECTED FOR  ANAPHYLAXIS SHOCK, Disp: 1 Device, Rfl: 2 .  hydrochlorothiazide (MICROZIDE) 12.5 MG capsule, TAKE 1 CAPSULE BY MOUTH EVERY DAY, Disp: 90 capsule, Rfl: 1 .  lisinopril (ZESTRIL) 10 MG tablet, Take 0.5 tablets (5 mg total) by mouth daily. 1/2 tablet daily, Disp: 90 tablet, Rfl: 2 .  magnesium oxide (MAG-OX) 400 MG tablet, Take 400 mg by mouth daily., Disp: , Rfl:  .  omeprazole (PRILOSEC) 40 MG capsule, Take 40 mg by mouth daily., Disp: , Rfl:  .  OZEMPIC, 1 MG/DOSE, 4 MG/3ML SOPN, Inject 1 mg as directed as directed., Disp: , Rfl:  .  POTASSIUM PO, Take 1 tablet by mouth daily., Disp: , Rfl:  .  Prenatal Vit-Fe Fumarate-FA (PRENATAL PO), Take 1 tablet by mouth daily., Disp: , Rfl:  .  rosuvastatin (CRESTOR) 5 MG tablet, TAKE 1 TABLET BY MOUTH EVERY DAY, Disp: 90 tablet, Rfl: 1  Orders Placed This Encounter  Procedures  . EKG 12-Lead   --Continue cardiac medications as reconciled in final medication list. --Return if symptoms worsen or fail to improve. Or sooner if needed. --Continue follow-up with your primary care physician regarding the management of your other chronic comorbid conditions.  Patient's questions and concerns were addressed to her satisfaction. She voices understanding of the instructions provided during this encounter.   During this visit I reviewed and updated: Tobacco history  allergies medication reconciliation  medical history  surgical history  family history  social history.  This note was created using a voice recognition software as a result there may be grammatical errors inadvertently enclosed that do not reflect the nature of this encounter. Every attempt is made to correct such errors.  Total time spent: 25 minutes reviewing old office notes, prior nuclear stress test results, discussing the recent echocardiogram report, discussing disease management and follow-up.  Tessa Lerner, [place], [facility]  Pager: [phone] Office: [phone]

## 2020-05-11 LAB — HM DIABETES EYE EXAM

## 2020-05-18 ENCOUNTER — Telehealth (INDEPENDENT_AMBULATORY_CARE_PROVIDER_SITE_OTHER): Payer: BC Managed Care – PPO | Admitting: Nurse Practitioner

## 2020-05-18 ENCOUNTER — Encounter: Payer: Self-pay | Admitting: Nurse Practitioner

## 2020-05-18 ENCOUNTER — Other Ambulatory Visit: Payer: Self-pay

## 2020-05-18 DIAGNOSIS — Z20822 Contact with and (suspected) exposure to covid-19: Secondary | ICD-10-CM | POA: Diagnosis not present

## 2020-05-18 DIAGNOSIS — R059 Cough, unspecified: Secondary | ICD-10-CM | POA: Diagnosis not present

## 2020-05-18 MED ORDER — AZITHROMYCIN 250 MG PO TABS: ORAL_TABLET | ORAL | 0 refills | Status: AC

## 2020-05-18 NOTE — Progress Notes (Signed)
Virtual Visit via phone call    This visit type was conducted due to national recommendations for restrictions regarding the COVID-19 Pandemic (e.g. social distancing) in an effort to limit this patient's exposure and mitigate transmission in our community.  Due to her co-morbid illnesses, this patient is at least at moderate risk for complications without adequate follow up.  This format is felt to be most appropriate for this patient at this time.  All issues noted in this document were discussed and addressed.  A limited physical exam was performed with this format.    This visit type was conducted due to national recommendations for restrictions regarding the COVID-19 Pandemic (e.g. social distancing) in an effort to limit this patient's exposure and mitigate transmission in our community.  Patients identity confirmed using two different identifiers.  This format is felt to be most appropriate for this patient at this time.  All issues noted in this document were discussed and addressed.  No physical exam was performed (except for noted visual exam findings with Video Visits).    Date:  05/18/2020   ID:  Teresa Newton, DOB 1955-08-01, MRN [id number]  Patient Location: At home   Provider location:   Office   Chief Complaint:  Fever, chills, fatigue, coughing, loss of appetite and some diarrhea.   History of Present Illness:    Teresa Newton is a 65 y.o. female who presents via video conferencing for a telehealth visit today.   The patient has have symptoms concerning for COVID-19 infection.    Patient was tested COVID positive on a rapid today. She got tested Rapid test at home. Symptoms started Monday, fever, chills, fatigue, coughing, loss of appetite, some diarrhea.  No shortness of breath. Vaccinated, but not with a booster. Patient will come in today for a PCR swab. The patient has taken Mucinex.     Past Medical History:  Diagnosis Date  . Bimalleolar ankle fracture     right  . Hypertension   . LTBI (latent tuberculosis infection)   . Plaque psoriasis   . Pre-diabetes   . Sleep apnea    gastric bypass, lost 70lbs and no longer needs CPAP   Past Surgical History:  Procedure Laterality Date  . COLONOSCOPY    . FOOT SURGERY Right   . GASTRIC BYPASS    . ORIF ANKLE FRACTURE Right 01/30/2018   Procedure: OPEN REDUCTION INTERNAL FIXATION (ORIF) right ankle trimalleolar fracture;  Surgeon: [name], MD;  Location: [facility];  Service: Orthopedics;  Laterality: Right;  . TUBAL LIGATION       Current Meds  Medication Sig  . azithromycin (ZITHROMAX Z-PAK) 250 MG tablet Take 2 tablets (500 mg) on  Day 1,  followed by 1 tablet (250 mg) once daily on Days 2 through 5.     Allergies:   Fish allergy, Milk-related compounds, and Penicillins   Social History   Tobacco Use  . Smoking status: Former Smoker    Packs/day: 0.50    Years: 3.00    Pack years: 1.50    Quit date: 1979    Years since quitting: 43.0  . Smokeless tobacco: Never Used  Vaping Use  . Vaping Use: Never used  Substance Use Topics  . Alcohol use: No    Alcohol/week: 0.0 standard drinks  . Drug use: No     Family Hx: The patient's family history includes Breast cancer (age of onset: 43) in her paternal aunt; Dementia in her mother;  Heart Problems in her mother; Ovarian cancer in her sister; Psoriasis in her brother; Stroke in her mother; Tuberculosis in her mother.  ROS:   Please see the history of present illness.    Review of Systems  Constitutional: Positive for chills, fever and malaise/fatigue.  Respiratory: Positive for cough.   Cardiovascular: Negative for chest pain and palpitations.  Gastrointestinal: Positive for diarrhea.  Musculoskeletal: Positive for myalgias.    All other systems reviewed and are negative.   Labs/Other Tests and Data Reviewed:    Recent Labs: 11/17/2019: ALT 70; BUN 21; Creatinine, Ser 0.83; Hemoglobin 12.3; Platelets 217;  Potassium 3.9; Sodium 141   Recent Lipid Panel Lab Results  Component Value Date/Time   CHOL 138 11/17/2019 12:03 PM   TRIG 63 11/17/2019 12:03 PM   HDL 60 11/17/2019 12:03 PM   CHOLHDL 2.3 11/17/2019 12:03 PM   CHOLHDL 3.4 01/29/2009 09:08 PM   LDLCALC 65 11/17/2019 12:03 PM    Wt Readings from Last 3 Encounters:  03/29/20 189 lb (85.7 kg)  11/17/19 183 lb 12.8 oz (83.4 kg)  05/04/19 200 lb (90.7 kg)     Exam:    Vital Signs:  There were no vitals taken for this visit.    Physical Exam Vitals and nursing note reviewed.  Constitutional:      Appearance: Normal appearance.  HENT:     Head: Normocephalic and atraumatic.  Pulmonary:     Effort: Pulmonary effort is normal.  Neurological:     Mental Status: She is alert and oriented to person, place, and time.  Psychiatric:        Mood and Affect: Affect normal.     ASSESSMENT & PLAN:     There are no diagnoses linked to this encounter.   1) Contact with and (suspected) exposure to COVID 19  - Patient tested positive on take home COVID test today -Symptoms started Monday -Continue to take OTC pain/fever relievers such as tylenol and/or ibuprofen  Advised patient to take Vitamin C, D, Zinc. Take baby aspirin daily. Keep yourself hydrated with a lot of water and rest. Take Delsym for cough and Mucinex. Take Tylenol or pain reliever every 4-6 hours as needed for pain/fever/body ache. Educated patient if symptoms get worse or if she experiences any SOB or pain in her legs to seek immediate emergency care. Continue to monitor your pulse oxygen. Call us if you have any questions. Quarantine for 5 days if tested positive and no symptoms or 10 days if tested positive and have symptoms. Wear a mask around other people.  -[facility] COVID test  -Prescription for Z-pak 250 mg. 5 day course sent to pharmacy.    2) Cough  -Patient will come in for PCR swab COVID test  -Take OTC delysm as needed for cough   Patient verbalized  understanding of the treatment plan and will call if symptoms get worse or seek emergency care if she experiences any shortness of pain, leg pain or chest pain.   COVID-19 Education: The signs and symptoms of COVID-19 were discussed with the patient and how to seek care for testing (follow up with PCP or arrange E-visit).  The importance of social distancing was discussed today.  Patient Risk:   After full review of this patients clinical status, I feel that they are at least moderate risk at this time.  Time:   Today, I have spent 20 minutes/ seconds with the patient with telehealth technology discussing above diagnoses.  Medication Adjustments/Labs and Tests Ordered: Current medicines are reviewed at length with the patient today.  Concerns regarding medicines are outlined above.   Tests Ordered: Orders Placed This Encounter  Procedures  . Novel Coronavirus, [facility] ([facility])    Medication Changes: Meds ordered this encounter  Medications  . azithromycin (ZITHROMAX Z-PAK) 250 MG tablet    Sig: Take 2 tablets (500 mg) on  Day 1,  followed by 1 tablet (250 mg) once daily on Days 2 through 5.    Dispense:  6 each    Refill:  0    Disposition:  Follow up if symptoms do not improve or get worse.   Signed, Charlesetta Ivory, NP

## 2020-05-21 LAB — NOVEL CORONAVIRUS, NAA: SARS-CoV-2, NAA: DETECTED — AB

## 2020-05-21 LAB — SARS-COV-2, NAA 2 DAY TAT

## 2020-05-24 ENCOUNTER — Telehealth: Payer: Self-pay

## 2020-05-24 NOTE — Telephone Encounter (Signed)
The patient was asked how she is doing and the pt said that she is feeling a lot better, she still has some chest congestion and that her chest hurts when she coughs but she is doing much better.

## 2020-05-24 NOTE — Telephone Encounter (Signed)
I left the pt a message that Dr. Sanders wanted to check and see how the patient is doing. ?

## 2020-05-27 ENCOUNTER — Other Ambulatory Visit: Payer: Self-pay | Admitting: Nurse Practitioner

## 2020-05-27 ENCOUNTER — Other Ambulatory Visit: Payer: Self-pay

## 2020-05-27 DIAGNOSIS — U071 COVID-19: Secondary | ICD-10-CM

## 2020-05-27 MED ORDER — ALBUTEROL SULFATE 108 (90 BASE) MCG/ACT IN AEPB: 2.0000 | INHALATION_SPRAY | RESPIRATORY_TRACT | 1 refills | Status: DC | PRN

## 2020-06-01 ENCOUNTER — Ambulatory Visit: Payer: Self-pay | Admitting: Internal Medicine

## 2020-06-06 ENCOUNTER — Other Ambulatory Visit: Payer: Self-pay | Admitting: Internal Medicine

## 2020-06-07 ENCOUNTER — Ambulatory Visit: Payer: Self-pay | Admitting: Internal Medicine

## 2020-06-17 ENCOUNTER — Other Ambulatory Visit: Payer: Self-pay | Admitting: Internal Medicine

## 2020-07-27 ENCOUNTER — Ambulatory Visit: Payer: Self-pay | Admitting: Internal Medicine

## 2020-09-02 ENCOUNTER — Other Ambulatory Visit: Payer: Self-pay | Admitting: Internal Medicine

## 2020-10-06 ENCOUNTER — Other Ambulatory Visit: Payer: Self-pay | Admitting: Nurse Practitioner

## 2020-10-12 ENCOUNTER — Ambulatory Visit: Payer: Self-pay | Admitting: Nurse Practitioner

## 2020-11-08 ENCOUNTER — Other Ambulatory Visit: Payer: Self-pay | Admitting: Internal Medicine

## 2020-11-08 ENCOUNTER — Encounter: Payer: Self-pay | Admitting: Internal Medicine

## 2020-11-08 DIAGNOSIS — Z1231 Encounter for screening mammogram for malignant neoplasm of breast: Secondary | ICD-10-CM

## 2020-11-09 ENCOUNTER — Other Ambulatory Visit: Payer: Self-pay

## 2020-11-09 ENCOUNTER — Ambulatory Visit (INDEPENDENT_AMBULATORY_CARE_PROVIDER_SITE_OTHER): Payer: Medicare PPO | Admitting: Nurse Practitioner

## 2020-11-09 ENCOUNTER — Encounter: Payer: Self-pay | Admitting: Nurse Practitioner

## 2020-11-09 ENCOUNTER — Other Ambulatory Visit: Payer: Self-pay | Admitting: Nurse Practitioner

## 2020-11-09 VITALS — BP 110/60 | HR 70 | Temp 98.9°F | Ht 62.6 in | Wt 195.2 lb

## 2020-11-09 DIAGNOSIS — I10 Essential (primary) hypertension: Secondary | ICD-10-CM | POA: Diagnosis not present

## 2020-11-09 DIAGNOSIS — Z6835 Body mass index (BMI) 35.0-35.9, adult: Secondary | ICD-10-CM

## 2020-11-09 DIAGNOSIS — I152 Hypertension secondary to endocrine disorders: Secondary | ICD-10-CM

## 2020-11-09 DIAGNOSIS — E785 Hyperlipidemia, unspecified: Secondary | ICD-10-CM

## 2020-11-09 DIAGNOSIS — E1169 Type 2 diabetes mellitus with other specified complication: Secondary | ICD-10-CM | POA: Diagnosis not present

## 2020-11-09 DIAGNOSIS — E669 Obesity, unspecified: Secondary | ICD-10-CM

## 2020-11-09 DIAGNOSIS — E559 Vitamin D deficiency, unspecified: Secondary | ICD-10-CM

## 2020-11-09 DIAGNOSIS — E1159 Type 2 diabetes mellitus with other circulatory complications: Secondary | ICD-10-CM

## 2020-11-09 MED ORDER — LISINOPRIL 5 MG PO TABS: 5.0000 mg | ORAL_TABLET | Freq: Every day | ORAL | 1 refills | Status: DC

## 2020-11-09 MED ORDER — PHENTERMINE HCL 37.5 MG PO TABS: 37.5000 mg | ORAL_TABLET | Freq: Every day | ORAL | 0 refills | Status: DC

## 2020-11-09 MED ORDER — ROSUVASTATIN CALCIUM 5 MG PO TABS: 5.0000 mg | ORAL_TABLET | Freq: Every day | ORAL | 1 refills | Status: DC

## 2020-11-09 NOTE — Progress Notes (Signed)
I,Tianna Badgett,acting as a Education administrator for [employer], NP.,have documented all relevant documentation on the behalf of [employer], NP,as directed by  Bary Castilla, NP while in the presence of Bary Castilla, NP.  This visit occurred during the SARS-CoV-2 public health emergency.  Safety protocols were in place, including screening questions prior to the visit, additional usage of staff PPE, and extensive cleaning of exam room while observing appropriate contact time as indicated for disinfecting solutions.  Subjective:     Patient ID: Teresa Newton , female    DOB: 01/19/1956 , 65 y.o.   MRN: [id number]   Chief Complaint  Patient presents with   Obesity    HPI  Patient is here to discuss options for weight loss. She states that she has been taking one of her friends phentermine and would like to continue with a prescription of her own. She also wants to do her blood work for her physical exam that is coming up on the 17 th with Dr. Baird Cancer.    She has taken it for a week. She is going to send me a msg with the dose of the phentermine she was taking. She is doing good with it. No elevated BP or HR. No complaints.     Past Medical History:  Diagnosis Date   Bimalleolar ankle fracture    right   Hypertension    LTBI (latent tuberculosis infection)    Plaque psoriasis    Pre-diabetes    Sleep apnea    gastric bypass, lost 70lbs and no longer needs CPAP     Family History  Problem Relation Age of Onset   Psoriasis Brother    Tuberculosis Mother    Heart Problems Mother    Dementia Mother    Stroke Mother    Breast cancer Paternal Aunt 8   Ovarian cancer Sister      Current Outpatient Medications:    albuterol (VENTOLIN HFA) 108 (90 Base) MCG/ACT inhaler, INHALE 2 PUFFS BY MOUTH EVERY 4 HOURS AS NEEDED FOR WHEEZE OR FOR SHORTNESS OF BREATH, Disp: 6.7 each, Rfl: 1   aspirin 81 MG tablet, Take 81 mg by mouth daily., Disp: , Rfl:    CALCIUM PO, Take 1  tablet by mouth daily., Disp: , Rfl:    Cholecalciferol (VITAMIN D3) 125 MCG (5000 UT) CAPS, Take by mouth., Disp: , Rfl:    COSENTYX 150 MG/ML SOSY, Inject into the skin every 30 (thirty) days., Disp: , Rfl:    Cyanocobalamin (B-12 PO), Take 1 tablet by mouth daily., Disp: , Rfl:    EPINEPHRINE 0.3 mg/0.3 mL IJ SOAJ injection, INJECT AS DIRECTED FOR ANAPHYLAXIS SHOCK, Disp: 1 Device, Rfl: 2   hydrochlorothiazide (MICROZIDE) 12.5 MG capsule, TAKE 1 CAPSULE BY MOUTH EVERY DAY, Disp: 90 capsule, Rfl: 1   magnesium oxide (MAG-OX) 400 MG tablet, Take 400 mg by mouth daily., Disp: , Rfl:    omeprazole (PRILOSEC) 40 MG capsule, Take 40 mg by mouth daily., Disp: , Rfl:    POTASSIUM PO, Take 1 tablet by mouth daily., Disp: , Rfl:    Prenatal Vit-Fe Fumarate-FA (PRENATAL PO), Take 1 tablet by mouth daily., Disp: , Rfl:    lisinopril (ZESTRIL) 5 MG tablet, Take 1 tablet (5 mg total) by mouth daily. 1/2 tablet daily, Disp: 90 tablet, Rfl: 1   rosuvastatin (CRESTOR) 5 MG tablet, Take 1 tablet (5 mg total) by mouth daily., Disp: 90 tablet, Rfl: 1   Allergies  Allergen Reactions   Fish  Allergy Anaphylaxis    Cod fish    Milk-Related Compounds Anaphylaxis    Goat Milk ONLY    Penicillins Hives     Review of Systems  Constitutional: Negative.  Negative for chills, fatigue and fever.  HENT:  Negative for congestion, sinus pressure, sinus pain and sneezing.   Respiratory: Negative.  Negative for shortness of breath and wheezing.   Cardiovascular: Negative.  Negative for chest pain and palpitations.  Gastrointestinal: Negative.   Endocrine: Negative for polydipsia, polyphagia and polyuria.  Musculoskeletal:  Negative for arthralgias and myalgias.  Neurological: Negative.  Negative for weakness and headaches.    Today's Vitals   11/09/20 1025  BP: 110/60  Pulse: 70  Temp: 98.9 F (37.2 C)  TempSrc: Oral  Weight: 195 lb 3.2 oz (88.5 kg)  Height: 5' 2.6" (1.59 m)   Body mass index is 35.02 kg/m.    Objective:  Physical Exam Constitutional:      Appearance: Normal appearance.  HENT:     Head: Normocephalic and atraumatic.  Cardiovascular:     Rate and Rhythm: Normal rate and regular rhythm.     Pulses: Normal pulses.     Heart sounds: Normal heart sounds. No murmur heard. Pulmonary:     Effort: Pulmonary effort is normal. No respiratory distress.     Breath sounds: Normal breath sounds. No wheezing.  Skin:    General: Skin is warm and dry.     Capillary Refill: Capillary refill takes less than 2 seconds.  Neurological:     Mental Status: She is alert and oriented to person, place, and time.  Psychiatric:        Mood and Affect: Mood normal.        Behavior: Behavior normal.        Assessment And Plan:     1. Class 2 severe obesity due to excess calories with serious comorbidity and body mass index (BMI) of 35.0 to 35.9 in adult [facility]) Advised patient on a healthy diet including avoiding fast food and red meats. Increase the intake of lean meats including grilled chicken and [place].  Drink a lot of water. Decrease intake of fatty foods. Exercise for 30-45 min. 4-5 a week to decrease the risk of cardiac event.  -Patient tried her friend's phentermine and would like to be on that. She is aware of the Side-effects and verbalizes understanding of all the side-effects discussed with her.  -She is going to send me a mychart msg with the name/dose of the medicine.  -Discussed importance of staying hydrated with water.   2. Hyperlipidemia associated with type 2 diabetes mellitus ([place]) -Chronic, continue meds  --Educated patient about a diet that is low in fat and high fatty foods including dairy products. Increase in take of fish and fiber. Decrease intake of red meats and fast foods. Exercise for atleast 4-5 times a week or atleast 30-45 min. Drink a lot of water.   - Lipid panel  3. Vitamin D deficiency -Will check and supplement if needed. Advised patient to spend atleast 15  min. Daily in sunlight.  - VITAMIN D 25 Hydroxy (Vit-D Deficiency, Fractures)  4. Essential hypertension, benign -Chronic, stable, continue meds  -Limit the intake of processed foods and salt intake. You should increase your intake of green vegetables and fruits. Limit the use of alcohol. Limit fast foods and fried foods. Avoid high fatty saturated and trans fat foods. Keep yourself hydrated with drinking water. Avoid red meats. Eat lean meats instead.  Exercise for atleast 30-45 min for atleast 4-5 times a week.  - CBC - CMP14+EGFR  5. Obesity, diabetes, and hypertension syndrome ([place]) -Will check Hgb A1c  --Discussed with patient the importance of glycemic control and long term complications from uncontrolled diabetes. Discussed with the patient the importance of compliance with home glucose monitoring, diet which includes decrease amount of sugary drinks and foods. Importance of exercise was also discussed with the patient. Importance of eye exams, self foot care and compliance to office visits was also discussed with the patient.  - Hemoglobin A1c    The patient was encouraged to call or send a message through [place] for any questions or concerns.   Follow up: physical exam in 2 weeks Dr. Baird Cancer   Side effects and appropriate use of all the medication(s) were discussed with the patient today. Patient advised to use the medication(s) as directed by their healthcare provider. The patient was encouraged to read, review, and understand all associated package inserts and contact our office with any questions or concerns. The patient accepts the risks of the treatment plan and had an opportunity to ask questions.   Patient was given opportunity to ask questions. Patient verbalized understanding of the plan and was able to repeat key elements of the plan. All questions were answered to their satisfaction.  Raman Ravon Mortellaro, DNP   I, Raman Carrye Goller have reviewed all documentation for this visit. The  documentation on 11/09/20 for the exam, diagnosis, procedures, and orders are all accurate and complete.    IF YOU HAVE BEEN REFERRED TO A SPECIALIST, IT MAY TAKE 1-2 WEEKS TO SCHEDULE/PROCESS THE REFERRAL. IF YOU HAVE NOT HEARD FROM US/SPECIALIST IN TWO WEEKS, PLEASE GIVE [place] A CALL AT [phone].   THE PATIENT IS ENCOURAGED TO PRACTICE SOCIAL DISTANCING DUE TO THE COVID-19 PANDEMIC.

## 2020-11-09 NOTE — Patient Instructions (Signed)
Obesity, Adult Obesity is having too much body fat. Being obese means that your weight is morethan what is healthy for you. BMI is a number that explains how much body fat you have. If you have a BMI of 30 or more, you are obese. Obesity is often caused by eating or drinking morecalories than your body uses. Changing your lifestyle can help you lose weight. Obesity can cause serious health problems, such as: Stroke. Coronary artery disease (CAD). Type 2 diabetes. Some types of cancer, including cancers of the colon, breast, uterus, and gallbladder. Osteoarthritis. High blood pressure (hypertension). High cholesterol. Sleep apnea. Gallbladder stones. Infertility problems. What are the causes? Eating meals each day that are high in calories, sugar, and fat. Being born with genes that may make you more likely to become obese. Having a medical condition that causes obesity. Taking certain medicines. Sitting a lot (having a sedentary lifestyle). Not getting enough sleep. Drinking a lot of drinks that have sugar in them. What increases the risk? Having a family history of obesity. Being an African American woman. Being a Hispanic man. Living in an area with limited access to: Parks, recreation centers, or sidewalks. Healthy food choices, such as grocery stores and farmers' markets. What are the signs or symptoms? The main sign is having too much body fat. How is this treated? Treatment for this condition often includes changing your lifestyle. Treatment may include: Changing your diet. This may include making a healthy meal plan. Exercise. This may include activity that causes your heart to beat faster (aerobic exercise) and strength training. Work with your doctor to design a program that works for you. Medicine to help you lose weight. This may be used if you are not able to lose 1 pound a week after 6 weeks of healthy eating and more exercise. Treating conditions that cause the  obesity. Surgery. Options may include gastric banding and gastric bypass. This may be done if: Other treatments have not helped to improve your condition. You have a BMI of 40 or higher. You have life-threatening health problems related to obesity. Follow these instructions at home: Eating and drinking  Follow advice from your doctor about what to eat and drink. Your doctor may tell you to: Limit fast food, sweets, and processed snack foods. Choose low-fat options. For example, choose low-fat milk instead of whole milk. Eat 5 or more servings of fruits or vegetables each day. Eat at home more often. This gives you more control over what you eat. Choose healthy foods when you eat out. Learn to read food labels. This will help you learn how much food is in 1 serving. Keep low-fat snacks available. Avoid drinks that have a lot of sugar in them. These include soda, fruit juice, iced tea with sugar, and flavored milk. Drink enough water to keep your pee (urine) pale yellow. Do not go on fad diets.  Physical activity Exercise often, as told by your doctor. Most adults should get up to 150 minutes of moderate-intensity exercise every week.Ask your doctor: What types of exercise are safe for you. How often you should exercise. Warm up and stretch before being active. Do slow stretching after being active (cool down). Rest between times of being active. Lifestyle Work with your doctor and a food expert (dietitian) to set a weight-loss goal that is best for you. Limit your screen time. Find ways to reward yourself that do not involve food. Do not drink alcohol if: Your doctor tells you not to drink.   You are pregnant, may be pregnant, or are planning to become pregnant. If you drink alcohol: Limit how much you use to: 0-1 drink a day for women. 0-2 drinks a day for men. Be aware of how much alcohol is in your drink. In the [place] one drink equals one 12 oz bottle of beer (355 mL), one 5 oz  glass of wine (148 mL), or one 1 oz glass of hard liquor (44 mL). General instructions Keep a weight-loss journal. This can help you keep track of: The food that you eat. How much exercise you get. Take over-the-counter and prescription medicines only as told by your doctor. Take vitamins and supplements only as told by your doctor. Think about joining a support group. Keep all follow-up visits as told by your doctor. This is important. Contact a doctor if: You cannot meet your weight loss goal after you have changed your diet and lifestyle for 6 weeks. Get help right away if you: Are having trouble breathing. Are having thoughts of harming yourself. Summary Obesity is having too much body fat. Being obese means that your weight is more than what is healthy for you. Work with your doctor to set a weight-loss goal. Get regular exercise as told by your doctor. This information is not intended to replace advice given to you by your health care provider. Make sure you discuss any questions you have with your healthcare provider. Document Revised: 12/26/2017 Document Reviewed: 12/26/2017 Elsevier Patient Education  2022 Elsevier Inc.  

## 2020-11-10 LAB — CBC
Hematocrit: 40.1 % (ref 34.0–46.6)
Hemoglobin: 13.4 g/dL (ref 11.1–15.9)
MCH: 30.1 pg (ref 26.6–33.0)
MCHC: 33.4 g/dL (ref 31.5–35.7)
MCV: 90 fL (ref 79–97)
Platelets: 247 10*3/uL (ref 150–450)
RBC: 4.45 x10E6/uL (ref 3.77–5.28)
RDW: 12.3 % (ref 11.7–15.4)
WBC: 4.3 10*3/uL (ref 3.4–10.8)

## 2020-11-10 LAB — LIPID PANEL
Chol/HDL Ratio: 2.7 ratio (ref 0.0–4.4)
Cholesterol, Total: 176 mg/dL (ref 100–199)
HDL: 65 mg/dL (ref >39)
LDL Chol Calc (NIH): 100 mg/dL — ABNORMAL HIGH (ref 0–99)
Triglycerides: 57 mg/dL (ref 0–149)
VLDL Cholesterol Cal: 11 mg/dL (ref 5–40)

## 2020-11-10 LAB — HEMOGLOBIN A1C
Est. average glucose Bld gHb Est-mCnc: 120 mg/dL
Hgb A1c MFr Bld: 5.8 % — ABNORMAL HIGH (ref 4.8–5.6)

## 2020-11-10 LAB — CMP14+EGFR
ALT: 29 IU/L (ref 0–32)
AST: 38 IU/L (ref 0–40)
Albumin/Globulin Ratio: 1.7 (ref 1.2–2.2)
Albumin: 4.7 g/dL (ref 3.8–4.8)
Alkaline Phosphatase: 88 IU/L (ref 44–121)
BUN/Creatinine Ratio: 25 (ref 12–28)
BUN: 23 mg/dL (ref 8–27)
Bilirubin Total: 0.6 mg/dL (ref 0.0–1.2)
CO2: 29 mmol/L (ref 20–29)
Calcium: 9.7 mg/dL (ref 8.7–10.3)
Chloride: 96 mmol/L (ref 96–106)
Creatinine, Ser: 0.92 mg/dL (ref 0.57–1.00)
Globulin, Total: 2.7 g/dL (ref 1.5–4.5)
Glucose: 95 mg/dL (ref 65–99)
Potassium: 3.8 mmol/L (ref 3.5–5.2)
Sodium: 137 mmol/L (ref 134–144)
Total Protein: 7.4 g/dL (ref 6.0–8.5)
eGFR: 70 mL/min/{1.73_m2} (ref >59)

## 2020-11-10 LAB — VITAMIN D 25 HYDROXY (VIT D DEFICIENCY, FRACTURES): Vit D, 25-Hydroxy: 105 ng/mL — ABNORMAL HIGH (ref 30.0–100.0)

## 2020-11-21 ENCOUNTER — Encounter: Payer: Self-pay | Admitting: Internal Medicine

## 2020-11-21 ENCOUNTER — Ambulatory Visit (INDEPENDENT_AMBULATORY_CARE_PROVIDER_SITE_OTHER): Payer: Medicare PPO | Admitting: Internal Medicine

## 2020-11-21 ENCOUNTER — Other Ambulatory Visit: Payer: Self-pay

## 2020-11-21 VITALS — BP 116/72 | HR 70 | Temp 98.8°F | Ht 63.0 in | Wt 196.4 lb

## 2020-11-21 DIAGNOSIS — Z0000 Encounter for general adult medical examination without abnormal findings: Secondary | ICD-10-CM

## 2020-11-21 DIAGNOSIS — I152 Hypertension secondary to endocrine disorders: Secondary | ICD-10-CM

## 2020-11-21 DIAGNOSIS — E1169 Type 2 diabetes mellitus with other specified complication: Secondary | ICD-10-CM | POA: Diagnosis not present

## 2020-11-21 DIAGNOSIS — Z23 Encounter for immunization: Secondary | ICD-10-CM

## 2020-11-21 DIAGNOSIS — E6609 Other obesity due to excess calories: Secondary | ICD-10-CM

## 2020-11-21 DIAGNOSIS — E1159 Type 2 diabetes mellitus with other circulatory complications: Secondary | ICD-10-CM | POA: Diagnosis not present

## 2020-11-21 DIAGNOSIS — Z6834 Body mass index (BMI) 34.0-34.9, adult: Secondary | ICD-10-CM

## 2020-11-21 LAB — POCT URINALYSIS DIPSTICK
Bilirubin, UA: NEGATIVE
Blood, UA: NEGATIVE
Glucose, UA: NEGATIVE
Ketones, UA: NEGATIVE
Nitrite, UA: NEGATIVE
Protein, UA: NEGATIVE
Spec Grav, UA: 1.01 (ref 1.010–1.025)
Urobilinogen, UA: 0.2 E.U./dL
pH, UA: 7 (ref 5.0–8.0)

## 2020-11-21 LAB — POCT UA - MICROALBUMIN
Albumin/Creatinine Ratio, Urine, POC: <30
Creatinine, POC: 50 mg/dL
Microalbumin Ur, POC: 10 mg/L

## 2020-11-21 NOTE — Patient Instructions (Signed)
Health Maintenance, Female Adopting a healthy lifestyle and getting preventive care are important in promoting health and wellness. Ask your health care provider about: The right schedule for you to have regular tests and exams. Things you can do on your own to prevent diseases and keep yourself healthy. What should I know about diet, weight, and exercise? Eat a healthy diet  Eat a diet that includes plenty of vegetables, fruits, low-fat dairy products, and lean protein. Do not eat a lot of foods that are high in solid fats, added sugars, or sodium.  Maintain a healthy weight Body mass index (BMI) is used to identify weight problems. It estimates body fat based on height and weight. Your health care provider can help determineyour BMI and help you achieve or maintain a healthy weight. Get regular exercise Get regular exercise. This is one of the most important things you can do for your health. Most adults should: Exercise for at least 150 minutes each week. The exercise should increase your heart rate and make you sweat (moderate-intensity exercise). Do strengthening exercises at least twice a week. This is in addition to the moderate-intensity exercise. Spend less time sitting. Even light physical activity can be beneficial. Watch cholesterol and blood lipids Have your blood tested for lipids and cholesterol at 65 years of age, then havethis test every 5 years. Have your cholesterol levels checked more often if: Your lipid or cholesterol levels are high. You are older than 65 years of age. You are at high risk for heart disease. What should I know about cancer screening? Depending on your health history and family history, you may need to have cancer screening at various ages. This may include screening for: Breast cancer. Cervical cancer. Colorectal cancer. Skin cancer. Lung cancer. What should I know about heart disease, diabetes, and high blood pressure? Blood pressure and heart  disease High blood pressure causes heart disease and increases the risk of stroke. This is more likely to develop in people who have high blood pressure readings, are of African descent, or are overweight. Have your blood pressure checked: Every 3-5 years if you are 18-39 years of age. Every year if you are 40 years old or older. Diabetes Have regular diabetes screenings. This checks your fasting blood sugar level. Have the screening done: Once every three years after age 40 if you are at a normal weight and have a low risk for diabetes. More often and at a younger age if you are overweight or have a high risk for diabetes. What should I know about preventing infection? Hepatitis B If you have a higher risk for hepatitis B, you should be screened for this virus. Talk with your health care provider to find out if you are at risk forhepatitis B infection. Hepatitis C Testing is recommended for: Everyone born from 1945 through 1965. Anyone with known risk factors for hepatitis C. Sexually transmitted infections (STIs) Get screened for STIs, including gonorrhea and chlamydia, if: You are sexually active and are younger than 65 years of age. You are older than 65 years of age and your health care provider tells you that you are at risk for this type of infection. Your sexual activity has changed since you were last screened, and you are at increased risk for chlamydia or gonorrhea. Ask your health care provider if you are at risk. Ask your health care provider about whether you are at high risk for HIV. Your health care provider may recommend a prescription medicine to help   prevent HIV infection. If you choose to take medicine to prevent HIV, you should first get tested for HIV. You should then be tested every 3 months for as long as you are taking the medicine. Pregnancy If you are about to stop having your period (premenopausal) and you may become pregnant, seek counseling before you get  pregnant. Take 400 to 800 micrograms (mcg) of folic acid every day if you become pregnant. Ask for birth control (contraception) if you want to prevent pregnancy. Osteoporosis and menopause Osteoporosis is a disease in which the bones lose minerals and strength with aging. This can result in bone fractures. If you are 65 years old or older, or if you are at risk for osteoporosis and fractures, ask your health care provider if you should: Be screened for bone loss. Take a calcium or vitamin D supplement to lower your risk of fractures. Be given hormone replacement therapy (HRT) to treat symptoms of menopause. Follow these instructions at home: Lifestyle Do not use any products that contain nicotine or tobacco, such as cigarettes, e-cigarettes, and chewing tobacco. If you need help quitting, ask your health care provider. Do not use street drugs. Do not share needles. Ask your health care provider for help if you need support or information about quitting drugs. Alcohol use Do not drink alcohol if: Your health care provider tells you not to drink. You are pregnant, may be pregnant, or are planning to become pregnant. If you drink alcohol: Limit how much you use to 0-1 drink a day. Limit intake if you are breastfeeding. Be aware of how much alcohol is in your drink. In the [place] one drink equals one 12 oz bottle of beer (355 mL), one 5 oz glass of wine (148 mL), or one 1 oz glass of hard liquor (44 mL). General instructions Schedule regular health, dental, and eye exams. Stay current with your vaccines. Tell your health care provider if: You often feel depressed. You have ever been abused or do not feel safe at home. Summary Adopting a healthy lifestyle and getting preventive care are important in promoting health and wellness. Follow your health care provider's instructions about healthy diet, exercising, and getting tested or screened for diseases. Follow your health care provider's  instructions on monitoring your cholesterol and blood pressure. This information is not intended to replace advice given to you by your health care provider. Make sure you discuss any questions you have with your healthcare provider. Document Revised: 04/16/2018 Document Reviewed: 04/16/2018 Elsevier Patient Education  2022 Else[employer] Inc.  

## 2020-11-21 NOTE — Progress Notes (Signed)
I,Katawbba Wiggins,acting as a Neurosurgeon for [name], MD.,have documented all relevant documentation on the behalf of [name], MD,as directed by  [name], MD while in the presence of [name], MD.  This visit occurred during the SARS-CoV-2 public health emergency.  Safety protocols were in place, including screening questions prior to the visit, additional usage of staff PPE, and extensive cleaning of exam room while observing appropriate contact time as indicated for disinfecting solutions.  Subjective:     Patient ID: Teresa Newton , female    DOB: Jun 18, 1955 , 65 y.o.   MRN: [id number]   Chief Complaint  Patient presents with   Annual Exam   Diabetes   Hypertension    HPI  She is here today for a full physical exam. She is followed by Dr. Cherly Hensen for her GYN exams. Last pap was 2021. She has no specific concerns or complaints at this time.   Diabetes She presents for her follow-up diabetic visit. She has type 2 diabetes mellitus. Her disease course has been stable. There are no hypoglycemic associated symptoms. Pertinent negatives for diabetes include no blurred vision, no chest pain, no fatigue (she c/o fatigue. states she is not sleeping well. she is not sure why. denies change in sleeping/eating habits. ), no foot paresthesias, no polydipsia and no polyphagia. There are no hypoglycemic complications. Risk factors for coronary artery disease include diabetes mellitus, dyslipidemia, hypertension, obesity, post-menopausal and sedentary lifestyle. She is following a generally healthy and diabetic diet. She never participates in exercise. Her breakfast blood glucose is taken between 7-8 am. Her breakfast blood glucose range is generally 90-110 mg/dl. An ACE inhibitor/angiotensin II receptor blocker is being taken. Eye exam is not current.  Hypertension This is a chronic problem. The current episode started more than 1 year ago. The problem has been gradually  improving since onset. The problem is controlled. Pertinent negatives include no blurred vision, chest pain, malaise/fatigue, palpitations or shortness of breath. Risk factors for coronary artery disease include post-menopausal state, obesity, sedentary lifestyle, dyslipidemia and diabetes mellitus. The current treatment provides moderate improvement.    Past Medical History:  Diagnosis Date   Bimalleolar ankle fracture    right   Hypertension    LTBI (latent tuberculosis infection)    Plaque psoriasis    Pre-diabetes    Sleep apnea    gastric bypass, lost 70lbs and no longer needs CPAP     Family History  Problem Relation Age of Onset   Psoriasis Brother    Tuberculosis Mother    Heart Problems Mother    Dementia Mother    Stroke Mother    Breast cancer Paternal Aunt 9   Ovarian cancer Sister      Current Outpatient Medications:    albuterol (VENTOLIN HFA) 108 (90 Base) MCG/ACT inhaler, INHALE 2 PUFFS BY MOUTH EVERY 4 HOURS AS NEEDED FOR WHEEZE OR FOR SHORTNESS OF BREATH, Disp: 6.7 each, Rfl: 1   aspirin 81 MG tablet, Take 81 mg by mouth daily., Disp: , Rfl:    CALCIUM PO, Take 1 tablet by mouth daily., Disp: , Rfl:    Cholecalciferol (VITAMIN D3) 125 MCG (5000 UT) CAPS, Take by mouth., Disp: , Rfl:    COSENTYX 150 MG/ML SOSY, Inject into the skin every 30 (thirty) days., Disp: , Rfl:    Cyanocobalamin (B-12 PO), Take 1 tablet by mouth daily., Disp: , Rfl:    EPINEPHRINE 0.3 mg/0.3 mL IJ SOAJ injection, INJECT AS  DIRECTED FOR ANAPHYLAXIS SHOCK, Disp: 1 Device, Rfl: 2   hydrochlorothiazide (MICROZIDE) 12.5 MG capsule, TAKE 1 CAPSULE BY MOUTH EVERY DAY, Disp: 90 capsule, Rfl: 1   lisinopril (ZESTRIL) 5 MG tablet, Take 1 tablet (5 mg total) by mouth daily. 1/2 tablet daily (Patient taking differently: Take 5 mg by mouth daily.), Disp: 90 tablet, Rfl: 1   magnesium oxide (MAG-OX) 400 MG tablet, Take 400 mg by mouth daily., Disp: , Rfl:    omeprazole (PRILOSEC) 40 MG capsule, Take  40 mg by mouth daily., Disp: , Rfl:    phentermine (ADIPEX-P) 37.5 MG tablet, Take 1 tablet (37.5 mg total) by mouth daily before breakfast., Disp: 30 tablet, Rfl: 0   POTASSIUM PO, Take 1 tablet by mouth daily., Disp: , Rfl:    Prenatal Vit-Fe Fumarate-FA (PRENATAL PO), Take 1 tablet by mouth daily., Disp: , Rfl:    rosuvastatin (CRESTOR) 5 MG tablet, Take 1 tablet (5 mg total) by mouth daily., Disp: 90 tablet, Rfl: 1   Allergies  Allergen Reactions   Fish Allergy Anaphylaxis    Cod fish    Milk-Related Compounds Anaphylaxis    Goat Milk ONLY    Penicillins Hives      The patient states she uses post menopausal status for birth control. Last LMP was No LMP recorded. Patient is postmenopausal.. Negative for Dysmenorrhea. Negative for: breast discharge, breast lump(s), breast pain and breast self exam. Associated symptoms include abnormal vaginal bleeding. Pertinent negatives include abnormal bleeding (hematology), anxiety, decreased libido, depression, difficulty falling sleep, dyspareunia, history of infertility, nocturia, sexual dysfunction, sleep disturbances, urinary incontinence, urinary urgency, vaginal discharge and vaginal itching. Diet regular.The patient states her exercise level is  intermittent.   . The patient's tobacco use is:  Social History   Tobacco Use  Smoking Status Former   Packs/day: 0.50   Years: 3.00   Pack years: 1.50   Types: Cigarettes   Quit date: 1979   Years since quitting: 43.5  Smokeless Tobacco Never  . She has been exposed to passive smoke. The patient's alcohol use is:  Social History   Substance and Sexual Activity  Alcohol Use No   Alcohol/week: 0.0 standard drinks    Review of Systems  Constitutional: Negative.  Negative for fatigue (she c/o fatigue. states she is not sleeping well. she is not sure why. denies change in sleeping/eating habits. ) and malaise/fatigue.  HENT: Negative.    Eyes: Negative.  Negative for blurred vision.   Respiratory: Negative.  Negative for shortness of breath.   Cardiovascular: Negative.  Negative for chest pain and palpitations.  Endocrine: Negative.  Negative for polydipsia and polyphagia.  Genitourinary: Negative.   Musculoskeletal: Negative.   Skin: Negative.   Allergic/Immunologic: Negative.   Neurological: Negative.   Hematological: Negative.   Psychiatric/Behavioral: Negative.      Today's Vitals   11/21/20 1111  BP: 116/72  Pulse: 70  Temp: 98.8 F (37.1 C)  TempSrc: Oral  Weight: 196 lb 6.4 oz (89.1 kg)  Height: 5\' 3"  (1.6 m)  PainSc: 0-No pain   Body mass index is 34.79 kg/m.  Wt Readings from Last 3 Encounters:  11/21/20 196 lb 6.4 oz (89.1 kg)  11/09/20 195 lb 3.2 oz (88.5 kg)  03/29/20 189 lb (85.7 kg)    BP Readings from Last 3 Encounters:  11/21/20 116/72  11/09/20 110/60  03/29/20 136/76    Objective:  Physical Exam Vitals and nursing note reviewed.  Constitutional:      Appearance:  Normal appearance.  HENT:     Head: Normocephalic and atraumatic.     Right Ear: Tympanic membrane, ear canal and external ear normal.     Left Ear: Tympanic membrane, ear canal and external ear normal.     Nose:     Comments: masked    Mouth/Throat:     Comments: Masked  Eyes:     Extraocular Movements: Extraocular movements intact.     Conjunctiva/sclera: Conjunctivae normal.     Pupils: Pupils are equal, round, and reactive to light.  Cardiovascular:     Rate and Rhythm: Normal rate and regular rhythm.     Pulses: Normal pulses.     Heart sounds: Normal heart sounds.  Pulmonary:     Effort: Pulmonary effort is normal.     Breath sounds: Normal breath sounds.  Chest:  Breasts:    Tanner Score is 5.     Right: Normal.     Left: Normal.  Abdominal:     General: Bowel sounds are normal.     Palpations: Abdomen is soft.     Comments: Healed RUQ surgical scar. Scattered patches of hyperpigmented dry skin on abdomen.   Genitourinary:    Comments:  deferred Musculoskeletal:        General: Normal range of motion.     Cervical back: Normal range of motion and neck supple.  Skin:    General: Skin is warm and dry.  Neurological:     General: No focal deficit present.     Mental Status: She is alert and oriented to person, place, and time.  Psychiatric:        Mood and Affect: Mood normal.        Behavior: Behavior normal.        Assessment And Plan:     1. Routine general medical examination at a health care facility Comments: A full exam was performed. Importance of monthly self breast exams was discussed with the patient. PATIENT IS ADVISED TO GET 30-45 MINUTES REGULAR EXERCISE NO LESS THAN FOUR TO FIVE DAYS PER WEEK - BOTH WEIGHTBEARING EXERCISES AND AEROBIC ARE RECOMMENDED.  PATIENT IS ADVISED TO FOLLOW A HEALTHY DIET WITH AT LEAST SIX FRUITS/VEGGIES PER DAY, DECREASE INTAKE OF RED MEAT, AND TO INCREASE FISH INTAKE TO TWO DAYS PER WEEK.  MEATS/FISH SHOULD NOT BE FRIED, BAKED OR BROILED IS PREFERABLE.  IT IS ALSO IMPORTANT TO CUT BACK ON YOUR SUGAR INTAKE. PLEASE AVOID ANYTHING WITH ADDED SUGAR, CORN SYRUP OR OTHER SWEETENERS. IF YOU MUST USE A SWEETENER, YOU CAN TRY STEVIA. IT IS ALSO IMPORTANT TO AVOID ARTIFICIALLY SWEETENERS AND DIET BEVERAGES. LASTLY, I SUGGEST WEARING SPF 50 SUNSCREEN ON EXPOSED PARTS AND ESPECIALLY WHEN IN THE DIRECT SUNLIGHT FOR AN EXTENDED PERIOD OF TIME.  PLEASE AVOID FAST FOOD RESTAURANTS AND INCREASE YOUR WATER INTAKE.   2. Obesity, diabetes, and hypertension syndrome (HCC) Comments: Diabetic foot exam was performed. Her BP is well controlled. EKG performed, NSR w/ non-specific abnormality - no new changes. I DISCUSSED WITH THE PATIENT AT LENGTH REGARDING THE GOALS OF GLYCEMIC CONTROL AND POSSIBLE LONG-TERM COMPLICATIONS.  I  ALSO STRESSED THE IMPORTANCE OF COMPLIANCE WITH HOME GLUCOSE MONITORING, DIETARY RESTRICTIONS INCLUDING AVOIDANCE OF SUGARY DRINKS/PROCESSED FOODS,  ALONG WITH REGULAR EXERCISE.  I  ALSO  STRESSED THE IMPORTANCE OF ANNUAL EYE EXAMS, SELF FOOT CARE AND COMPLIANCE WITH OFFICE VISITS.  - EKG 12-Lead - POCT Urinalysis Dipstick (81002) - POCT UA - Microalbumin  3. Class 1 obesity due to excess  calories with serious comorbidity and body mass index (BMI) of 34.0 to 34.9 in adult Comments: She is encouraged to strive for BMI less than 30 to decrease cardiac risk. Advised to aim for at least 150 minutes of exercise per week. She does not wish to start semaglutide. She prefers to c/w phentermine. She will consider at future visit. She will rto in 8 weeks for re-evaluation.  4. Immunization due She was given Pneumovax-23 to update her immunization history.   Patient was given opportunity to ask questions. Patient verbalized understanding of the plan and was able to repeat key elements of the plan. All questions were answered to their satisfaction.   I, [name], MD, have reviewed all documentation for this visit. The documentation on 11/21/20 for the exam, diagnosis, procedures, and orders are all accurate and complete.   THE PATIENT IS ENCOURAGED TO PRACTICE SOCIAL DISTANCING DUE TO THE COVID-19 PANDEMIC.

## 2020-11-30 ENCOUNTER — Encounter: Payer: Self-pay | Admitting: Internal Medicine

## 2020-12-02 ENCOUNTER — Encounter: Payer: Self-pay | Admitting: Internal Medicine

## 2020-12-08 ENCOUNTER — Other Ambulatory Visit: Payer: Self-pay | Admitting: Nurse Practitioner

## 2020-12-08 ENCOUNTER — Encounter: Payer: Self-pay | Admitting: Internal Medicine

## 2020-12-08 DIAGNOSIS — Z6835 Body mass index (BMI) 35.0-35.9, adult: Secondary | ICD-10-CM

## 2020-12-08 MED ORDER — PHENTERMINE HCL 37.5 MG PO TABS: 37.5000 mg | ORAL_TABLET | Freq: Every day | ORAL | 0 refills | Status: DC

## 2020-12-14 ENCOUNTER — Other Ambulatory Visit: Payer: Self-pay | Admitting: Internal Medicine

## 2021-01-02 ENCOUNTER — Other Ambulatory Visit: Payer: Self-pay

## 2021-01-02 ENCOUNTER — Ambulatory Visit
Admission: RE | Admit: 2021-01-02 | Discharge: 2021-01-02 | Disposition: A | Discharge status: Home / Self Care | Payer: Medicare PPO | Source: Ambulatory Visit | Attending: Internal Medicine | Admitting: Internal Medicine

## 2021-01-02 DIAGNOSIS — Z1231 Encounter for screening mammogram for malignant neoplasm of breast: Secondary | ICD-10-CM

## 2021-01-25 ENCOUNTER — Ambulatory Visit: Payer: Medicare PPO | Admitting: Internal Medicine

## 2021-01-25 ENCOUNTER — Encounter: Payer: Self-pay | Admitting: Internal Medicine

## 2021-01-25 ENCOUNTER — Other Ambulatory Visit: Payer: Self-pay

## 2021-01-25 VITALS — BP 134/70 | HR 68 | Temp 98.2°F | Ht 62.8 in | Wt 204.0 lb

## 2021-01-25 DIAGNOSIS — Z2821 Immunization not carried out because of patient refusal: Secondary | ICD-10-CM | POA: Diagnosis not present

## 2021-01-25 DIAGNOSIS — E2839 Other primary ovarian failure: Secondary | ICD-10-CM | POA: Diagnosis not present

## 2021-01-25 DIAGNOSIS — Z6836 Body mass index (BMI) 36.0-36.9, adult: Secondary | ICD-10-CM

## 2021-01-25 NOTE — Progress Notes (Signed)
I,Teresa Newton,acting as a Neurosurgeon for Teresa Aliment, MD.,have documented all relevant documentation on the behalf of Teresa Aliment, MD,as directed by  Teresa Aliment, MD while in the presence of Teresa Aliment, MD.  This visit occurred during the SARS-CoV-2 public health emergency.  Safety protocols were in place, including screening questions prior to the visit, additional usage of staff PPE, and extensive cleaning of exam room while observing appropriate contact time as indicated for disinfecting solutions.  Subjective:     Patient ID: Teresa Newton , female    DOB: 09-05-1955 , 65 y.o.   MRN: [id number]   Chief Complaint  Patient presents with   Weight Check    HPI  Patient presents today for a weight check. Patient stated she stopped taking the phentermine, it made her really nervous and jittery.  She admits she is not exercising as much as she should.       Past Medical History:  Diagnosis Date   Bimalleolar ankle fracture    right   Hypertension    LTBI (latent tuberculosis infection)    Plaque psoriasis    Pre-diabetes    Sleep apnea    gastric bypass, lost 70lbs and no longer needs CPAP     Family History  Problem Relation Age of Onset   Psoriasis Brother    Tuberculosis Mother    Heart Problems Mother    Dementia Mother    Stroke Mother    Breast cancer Paternal Aunt 35   Ovarian cancer Sister      Current Outpatient Medications:    albuterol (VENTOLIN HFA) 108 (90 Base) MCG/ACT inhaler, INHALE 2 PUFFS BY MOUTH EVERY 4 HOURS AS NEEDED FOR WHEEZE OR FOR SHORTNESS OF BREATH, Disp: 6.7 each, Rfl: 1   aspirin 81 MG tablet, Take 81 mg by mouth daily., Disp: , Rfl:    CALCIUM PO, Take 1 tablet by mouth daily., Disp: , Rfl:    Cholecalciferol (VITAMIN D3) 125 MCG (5000 UT) CAPS, Take by mouth., Disp: , Rfl:    COSENTYX 150 MG/ML SOSY, Inject into the skin every 30 (thirty) days., Disp: , Rfl:    Cyanocobalamin (B-12 PO), Take 1 tablet by mouth  daily., Disp: , Rfl:    EPINEPHRINE 0.3 mg/0.3 mL IJ SOAJ injection, INJECT AS DIRECTED FOR ANAPHYLAXIS SHOCK, Disp: 1 Device, Rfl: 2   hydrochlorothiazide (MICROZIDE) 12.5 MG capsule, TAKE 1 CAPSULE BY MOUTH EVERY DAY, Disp: 90 capsule, Rfl: 1   lisinopril (ZESTRIL) 5 MG tablet, Take 1 tablet (5 mg total) by mouth daily. 1/2 tablet daily (Patient taking differently: Take 5 mg by mouth daily.), Disp: 90 tablet, Rfl: 1   magnesium oxide (MAG-OX) 400 MG tablet, Take 400 mg by mouth daily., Disp: , Rfl:    omeprazole (PRILOSEC) 40 MG capsule, Take 40 mg by mouth daily., Disp: , Rfl:    phentermine (ADIPEX-P) 37.5 MG tablet, Take 1 tablet (37.5 mg total) by mouth daily before breakfast., Disp: 30 tablet, Rfl: 0   POTASSIUM PO, Take 1 tablet by mouth daily., Disp: , Rfl:    Prenatal Vit-Fe Fumarate-FA (PRENATAL PO), Take 1 tablet by mouth daily., Disp: , Rfl:    rosuvastatin (CRESTOR) 5 MG tablet, Take 1 tablet (5 mg total) by mouth daily., Disp: 90 tablet, Rfl: 1   Allergies  Allergen Reactions   Fish Allergy Anaphylaxis    Cod fish    Milk-Related Compounds Anaphylaxis    Goat Milk ONLY  Penicillins Hives     Review of Systems  Constitutional: Negative.   Eyes: Negative.   Respiratory: Negative.    Cardiovascular: Negative.   Gastrointestinal: Negative.   Skin: Negative.   Neurological: Negative.   Psychiatric/Behavioral: Negative.      Today's Vitals   01/25/21 1131  BP: 134/70  Pulse: 68  Temp: 98.2 F (36.8 C)  Weight: 204 lb (92.5 kg)  Height: 5' 2.8" (1.595 m)  PainSc: 0-No pain   Body mass index is 36.37 kg/m.  Wt Readings from Last 3 Encounters:  01/25/21 204 lb (92.5 kg)  11/21/20 196 lb 6.4 oz (89.1 kg)  11/09/20 195 lb 3.2 oz (88.5 kg)     Objective:  Physical Exam Vitals and nursing note reviewed.  Constitutional:      Appearance: Normal appearance.  HENT:     Head: Normocephalic and atraumatic.     Nose:     Comments: Masked     Mouth/Throat:      Comments: Masked  Eyes:     Extraocular Movements: Extraocular movements intact.  Cardiovascular:     Rate and Rhythm: Normal rate and regular rhythm.     Heart sounds: Normal heart sounds.  Pulmonary:     Effort: Pulmonary effort is normal.     Breath sounds: Normal breath sounds.  Musculoskeletal:     Cervical back: Normal range of motion.  Skin:    General: Skin is warm.  Neurological:     General: No focal deficit present.     Mental Status: She is alert.  Psychiatric:        Mood and Affect: Mood normal.        Behavior: Behavior normal.        Assessment And Plan:     1. Class 2 severe obesity due to excess calories with serious comorbidity and body mass index (BMI) of 36.0 to 36.9 in adult [facility]) Comments: She has gained 8 lbs since July 2022. She is encouraged to aim for at least 150 minutes of exercise per week. She agrees to [facility] clinic referral.  - Amb Ref to Medical Weight Management  2. Estrogen deficiency Comments: She agrees to increase weight-bearing exercises and take calcium/vitamin D supplements.  - DG Bone Density; Future  3. Influenza vaccination declined    Patient was given opportunity to ask questions. Patient verbalized understanding of the plan and was able to repeat key elements of the plan. All questions were answered to their satisfaction.   I, Teresa Aliment, MD, have reviewed all documentation for this visit. The documentation on 01/27/21 for the exam, diagnosis, procedures, and orders are all accurate and complete.   IF YOU HAVE BEEN REFERRED TO A SPECIALIST, IT MAY TAKE 1-2 WEEKS TO SCHEDULE/PROCESS THE REFERRAL. IF YOU HAVE NOT HEARD FROM US/SPECIALIST IN TWO WEEKS, PLEASE GIVE [place] A CALL AT [phone].   THE PATIENT IS ENCOURAGED TO PRACTICE SOCIAL DISTANCING DUE TO THE COVID-19 PANDEMIC.

## 2021-01-25 NOTE — Patient Instructions (Signed)

## 2021-04-04 ENCOUNTER — Ambulatory Visit: Payer: Medicare PPO | Admitting: Internal Medicine

## 2021-04-04 ENCOUNTER — Encounter: Payer: Self-pay | Admitting: Internal Medicine

## 2021-04-04 ENCOUNTER — Other Ambulatory Visit: Payer: Self-pay

## 2021-04-04 VITALS — BP 122/70 | HR 82 | Temp 98.5°F | Ht 63.2 in | Wt 205.8 lb

## 2021-04-04 DIAGNOSIS — E1169 Type 2 diabetes mellitus with other specified complication: Secondary | ICD-10-CM

## 2021-04-04 DIAGNOSIS — I10 Essential (primary) hypertension: Secondary | ICD-10-CM

## 2021-04-04 DIAGNOSIS — E1159 Type 2 diabetes mellitus with other circulatory complications: Secondary | ICD-10-CM

## 2021-04-04 DIAGNOSIS — R7989 Other specified abnormal findings of blood chemistry: Secondary | ICD-10-CM

## 2021-04-04 DIAGNOSIS — I152 Hypertension secondary to endocrine disorders: Secondary | ICD-10-CM | POA: Diagnosis not present

## 2021-04-04 DIAGNOSIS — Z6836 Body mass index (BMI) 36.0-36.9, adult: Secondary | ICD-10-CM | POA: Diagnosis not present

## 2021-04-04 DIAGNOSIS — E669 Obesity, unspecified: Secondary | ICD-10-CM

## 2021-04-04 DIAGNOSIS — Z23 Encounter for immunization: Secondary | ICD-10-CM

## 2021-04-04 NOTE — Progress Notes (Signed)
[name],acting as a Education administrator for [name], MD.,have documented all relevant documentation on the behalf of [name], MD,as directed by  [name], MD while in the presence of [name], MD.  This visit occurred during the SARS-CoV-2 public health emergency.  Safety protocols were in place, including screening questions prior to the visit, additional usage of staff PPE, and extensive cleaning of exam room while observing appropriate contact time as indicated for disinfecting solutions.  Subjective:     Patient ID: Teresa Newton , female    DOB: [birth year] , 65 y.o.   MRN: [id number]   Chief Complaint  Patient presents with   Diabetes   Weight Check    HPI  Patient presents today for diabetes/obesity f/u.  She reports she is no longer taking the phentermine due to how it made her feel. She is not currently on meds for diabetes. Reports compliance with lisinopril.     Diabetes She presents for her follow-up diabetic visit. She has type 2 diabetes mellitus. Her disease course has been stable. There are no hypoglycemic associated symptoms. Pertinent negatives for diabetes include no blurred vision and no chest pain. There are no hypoglycemic complications. Risk factors for coronary artery disease include diabetes mellitus, dyslipidemia, hypertension, obesity, post-menopausal and sedentary lifestyle. She is following a generally healthy and diabetic diet. She never participates in exercise. Her breakfast blood glucose is taken between 7-8 am. Her breakfast blood glucose range is generally 90-110 mg/dl. An ACE inhibitor/angiotensin II receptor blocker is being taken. Eye exam is current.  Hypertension This is a chronic problem. The current episode started more than 1 year ago. The problem has been gradually improving since onset. The problem is controlled. Pertinent negatives include no blurred vision, chest pain, palpitations or shortness of breath.    Past  Medical History:  Diagnosis Date   Bimalleolar ankle fracture    right   Hypertension    LTBI (latent tuberculosis infection)    Plaque psoriasis    Pre-diabetes    Sleep apnea    gastric bypass, lost 70lbs and no longer needs CPAP     Family History  Problem Relation Age of Onset   Psoriasis Brother    Tuberculosis Mother    Heart Problems Mother    Dementia Mother    Stroke Mother    Breast cancer Paternal Aunt 84   Ovarian cancer Sister      Current Outpatient Medications:    albuterol (VENTOLIN HFA) 108 (90 Base) MCG/ACT inhaler, INHALE 2 PUFFS BY MOUTH EVERY 4 HOURS AS NEEDED FOR WHEEZE OR FOR SHORTNESS OF BREATH, Disp: 6.7 each, Rfl: 1   aspirin 81 MG tablet, Take 81 mg by mouth daily., Disp: , Rfl:    CALCIUM PO, Take 1 tablet by mouth daily., Disp: , Rfl:    Cholecalciferol (VITAMIN D3) 50 MCG (2000 UT) TABS, Take by mouth. daily, Disp: , Rfl:    COSENTYX 150 MG/ML SOSY, Inject into the skin every 30 (thirty) days., Disp: , Rfl:    Cyanocobalamin (B-12 PO), Take 1 tablet by mouth daily., Disp: , Rfl:    EPINEPHRINE 0.3 mg/0.3 mL IJ SOAJ injection, INJECT AS DIRECTED FOR ANAPHYLAXIS SHOCK, Disp: 1 Device, Rfl: 2   hydrochlorothiazide (MICROZIDE) 12.5 MG capsule, TAKE 1 CAPSULE BY MOUTH EVERY DAY, Disp: 90 capsule, Rfl: 1   lisinopril (ZESTRIL) 5 MG tablet, Take 1 tablet (5 mg total) by mouth daily. 1/2 tablet daily (Patient taking differently:  Take 5 mg by mouth daily.), Disp: 90 tablet, Rfl: 1   magnesium oxide (MAG-OX) 400 MG tablet, Take 400 mg by mouth daily., Disp: , Rfl:    omeprazole (PRILOSEC) 40 MG capsule, Take 40 mg by mouth daily., Disp: , Rfl:    POTASSIUM PO, Take 1 tablet by mouth daily., Disp: , Rfl:    Prenatal Vit-Fe Fumarate-FA (PRENATAL PO), Take 1 tablet by mouth daily., Disp: , Rfl:    rosuvastatin (CRESTOR) 5 MG tablet, Take 1 tablet (5 mg total) by mouth daily., Disp: 90 tablet, Rfl: 1   Allergies  Allergen Reactions   Fish Allergy Anaphylaxis     Cod fish    Milk-Related Compounds Anaphylaxis    Goat Milk ONLY    Penicillins Hives     Review of Systems  Constitutional: Negative.   Eyes:  Negative for blurred vision.  Respiratory: Negative.  Negative for shortness of breath.   Cardiovascular: Negative.  Negative for chest pain and palpitations.  Gastrointestinal: Negative.   Neurological: Negative.   Psychiatric/Behavioral: Negative.      Today's Vitals   04/04/21 1112  BP: 122/70  Pulse: 82  Temp: 98.5 F (36.9 C)  Weight: 205 lb 12.8 oz (93.4 kg)  Height: 5' 3.2" (1.605 m)  PainSc: 0-No pain   Body mass index is 36.23 kg/m.  Wt Readings from Last 3 Encounters:  04/04/21 205 lb 12.8 oz (93.4 kg)  01/25/21 204 lb (92.5 kg)  11/21/20 196 lb 6.4 oz (89.1 kg)     Objective:  Physical Exam Vitals and nursing note reviewed.  Constitutional:      Appearance: Normal appearance. She is obese.  HENT:     Head: Normocephalic and atraumatic.     Nose:     Comments: Masked     Mouth/Throat:     Comments: Masked  Eyes:     Extraocular Movements: Extraocular movements intact.  Cardiovascular:     Rate and Rhythm: Normal rate and regular rhythm.     Heart sounds: Normal heart sounds.  Pulmonary:     Effort: Pulmonary effort is normal.     Breath sounds: Normal breath sounds.  Musculoskeletal:     Cervical back: Normal range of motion.  Skin:    General: Skin is warm.  Neurological:     General: No focal deficit present.     Mental Status: She is alert.  Psychiatric:        Mood and Affect: Mood normal.        Behavior: Behavior normal.        Assessment And Plan:     1. Obesity, diabetes, and hypertension syndrome ([place]) Comments: She is interested in something for weight loss that will also address her diabetes. She was given samples of Rybelsus, 58m. She denies family history of thyroid cancer. Reminded to stop eating when full and advised of possible side effects. If tolerated, I plan to increase dose  to 756min four weeks. Advised to take upon awakening with four ounces of water and waiting 30 minutes prior to eating/taking other meds. All questions were answered to her satisfaction.  - Hemoglobin A1c - Vitamin D (25 hydroxy) - BMP8+eGFR  2. High serum vitamin D Comments: I will recheck vitamin D levels today.  - Vitamin D (25 hydroxy)  3. Class 2 severe obesity due to excess calories with serious comorbidity and body mass index (BMI) of 36.0 to 36.9 in adult [facility]: She is encouraged to strive for  BMI less than 30 to decrease cardiac risk. Advised to aim for at least 150 minutes of exercise per week.    Patient was given opportunity to ask questions. Patient verbalized understanding of the plan and was able to repeat key elements of the plan. All questions were answered to their satisfaction.   I, [name], MD, have reviewed all documentation for this visit. The documentation on 04/15/21 for the exam, diagnosis, procedures, and orders are all accurate and complete.   IF YOU HAVE BEEN REFERRED TO A SPECIALIST, IT MAY TAKE 1-2 WEEKS TO SCHEDULE/PROCESS THE REFERRAL. IF YOU HAVE NOT HEARD FROM US/SPECIALIST IN TWO WEEKS, PLEASE GIVE [place] A CALL AT [phone].   THE PATIENT IS ENCOURAGED TO PRACTICE SOCIAL DISTANCING DUE TO THE COVID-19 PANDEMIC.

## 2021-04-04 NOTE — Patient Instructions (Signed)

## 2021-04-05 LAB — BMP8+EGFR
BUN/Creatinine Ratio: 25 (ref 12–28)
BUN: 20 mg/dL (ref 8–27)
CO2: 28 mmol/L (ref 20–29)
Calcium: 9.3 mg/dL (ref 8.7–10.3)
Chloride: 100 mmol/L (ref 96–106)
Creatinine, Ser: 0.79 mg/dL (ref 0.57–1.00)
Glucose: 71 mg/dL (ref 70–99)
Potassium: 4.3 mmol/L (ref 3.5–5.2)
Sodium: 139 mmol/L (ref 134–144)
eGFR: 83 mL/min/{1.73_m2} (ref >59)

## 2021-04-05 LAB — VITAMIN D 25 HYDROXY (VIT D DEFICIENCY, FRACTURES): Vit D, 25-Hydroxy: 73.4 ng/mL (ref 30.0–100.0)

## 2021-04-05 LAB — HEMOGLOBIN A1C
Est. average glucose Bld gHb Est-mCnc: 120 mg/dL
Hgb A1c MFr Bld: 5.8 % — ABNORMAL HIGH (ref 4.8–5.6)

## 2021-04-06 ENCOUNTER — Other Ambulatory Visit: Payer: Self-pay

## 2021-04-17 DIAGNOSIS — L400 Psoriasis vulgaris: Secondary | ICD-10-CM | POA: Diagnosis not present

## 2021-04-17 DIAGNOSIS — Z79899 Other long term (current) drug therapy: Secondary | ICD-10-CM | POA: Diagnosis not present

## 2021-05-06 ENCOUNTER — Other Ambulatory Visit: Payer: Self-pay | Admitting: Nurse Practitioner

## 2021-05-18 ENCOUNTER — Other Ambulatory Visit: Payer: Self-pay

## 2021-05-18 MED ORDER — RYBELSUS 3 MG PO TABS: ORAL_TABLET | ORAL | 1 refills | Status: DC

## 2021-06-07 DIAGNOSIS — Z0289 Encounter for other administrative examinations: Secondary | ICD-10-CM

## 2021-06-13 ENCOUNTER — Other Ambulatory Visit: Payer: Self-pay | Admitting: Internal Medicine

## 2021-06-14 ENCOUNTER — Ambulatory Visit (INDEPENDENT_AMBULATORY_CARE_PROVIDER_SITE_OTHER): Payer: Medicare PPO | Admitting: Family Medicine

## 2021-06-14 ENCOUNTER — Encounter (INDEPENDENT_AMBULATORY_CARE_PROVIDER_SITE_OTHER): Payer: Self-pay | Admitting: Family Medicine

## 2021-06-14 ENCOUNTER — Other Ambulatory Visit: Payer: Self-pay

## 2021-06-14 VITALS — BP 111/73 | HR 63 | Temp 97.9°F | Ht 64.0 in | Wt 209.0 lb

## 2021-06-14 DIAGNOSIS — R0602 Shortness of breath: Secondary | ICD-10-CM

## 2021-06-14 DIAGNOSIS — Z6835 Body mass index (BMI) 35.0-35.9, adult: Secondary | ICD-10-CM | POA: Diagnosis not present

## 2021-06-14 DIAGNOSIS — R5383 Other fatigue: Secondary | ICD-10-CM

## 2021-06-14 DIAGNOSIS — E119 Type 2 diabetes mellitus without complications: Secondary | ICD-10-CM | POA: Insufficient documentation

## 2021-06-14 DIAGNOSIS — E669 Obesity, unspecified: Secondary | ICD-10-CM

## 2021-06-14 DIAGNOSIS — E1159 Type 2 diabetes mellitus with other circulatory complications: Secondary | ICD-10-CM

## 2021-06-14 DIAGNOSIS — E538 Deficiency of other specified B group vitamins: Secondary | ICD-10-CM | POA: Diagnosis not present

## 2021-06-14 DIAGNOSIS — E1169 Type 2 diabetes mellitus with other specified complication: Secondary | ICD-10-CM

## 2021-06-14 DIAGNOSIS — I152 Hypertension secondary to endocrine disorders: Secondary | ICD-10-CM

## 2021-06-14 DIAGNOSIS — E782 Mixed hyperlipidemia: Secondary | ICD-10-CM | POA: Diagnosis not present

## 2021-06-14 DIAGNOSIS — Z1331 Encounter for screening for depression: Secondary | ICD-10-CM

## 2021-06-14 DIAGNOSIS — Z903 Acquired absence of stomach [part of]: Secondary | ICD-10-CM

## 2021-06-14 DIAGNOSIS — E559 Vitamin D deficiency, unspecified: Secondary | ICD-10-CM

## 2021-06-14 DIAGNOSIS — E66812 Obesity, class 2: Secondary | ICD-10-CM

## 2021-06-14 DIAGNOSIS — E739 Lactose intolerance, unspecified: Secondary | ICD-10-CM | POA: Insufficient documentation

## 2021-06-15 LAB — CBC WITH DIFFERENTIAL/PLATELET
Basophils Absolute: 0 10*3/uL (ref 0.0–0.2)
Basos: 1 %
EOS (ABSOLUTE): 0.1 10*3/uL (ref 0.0–0.4)
Eos: 2 %
Hematocrit: 38.4 % (ref 34.0–46.6)
Hemoglobin: 13.1 g/dL (ref 11.1–15.9)
Immature Grans (Abs): 0 10*3/uL (ref 0.0–0.1)
Immature Granulocytes: 0 %
Lymphocytes Absolute: 2.5 10*3/uL (ref 0.7–3.1)
Lymphs: 49 %
MCH: 30.6 pg (ref 26.6–33.0)
MCHC: 34.1 g/dL (ref 31.5–35.7)
MCV: 90 fL (ref 79–97)
Monocytes Absolute: 0.4 10*3/uL (ref 0.1–0.9)
Monocytes: 8 %
Neutrophils Absolute: 2 10*3/uL (ref 1.4–7.0)
Neutrophils: 40 %
Platelets: 281 10*3/uL (ref 150–450)
RBC: 4.28 x10E6/uL (ref 3.77–5.28)
RDW: 13 % (ref 11.7–15.4)
WBC: 5 10*3/uL (ref 3.4–10.8)

## 2021-06-15 LAB — LIPID PANEL WITH LDL/HDL RATIO
Cholesterol, Total: 132 mg/dL (ref 100–199)
HDL: 64 mg/dL (ref >39)
LDL Chol Calc (NIH): 54 mg/dL (ref 0–99)
LDL/HDL Ratio: 0.8 ratio (ref 0.0–3.2)
Triglycerides: 68 mg/dL (ref 0–149)
VLDL Cholesterol Cal: 14 mg/dL (ref 5–40)

## 2021-06-15 LAB — TSH: TSH: 0.673 u[IU]/mL (ref 0.450–4.500)

## 2021-06-15 LAB — VITAMIN B12: Vitamin B-12: >2000 pg/mL — ABNORMAL HIGH (ref 232–1245)

## 2021-06-15 LAB — INSULIN, RANDOM: INSULIN: 6.2 u[IU]/mL (ref 2.6–24.9)

## 2021-06-15 LAB — T4, FREE: Free T4: 1.15 ng/dL (ref 0.82–1.77)

## 2021-06-19 NOTE — Progress Notes (Signed)
Dear Dr. Allyne Gee,   Thank you for referring Teresa Newton to our clinic. The following note includes my evaluation and treatment recommendations.  Chief Complaint:   OBESITY [name] (MR# [id number]) is a 66 y.o. female who presents for evaluation and treatment of obesity and related comorbidities. Current BMI is Body mass index is 35.87 kg/m. Teresa Newton has been struggling with her weight for many years and has been unsuccessful in either losing weight, maintaining weight loss, or reaching her healthy weight goal.  Teresa Newton is currently in the action stage of change and ready to dedicate time achieving and maintaining a healthier weight. Teresa Newton is interested in becoming our patient and working on intensive lifestyle modifications including (but not limited to) diet and exercise for weight loss.  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 She has 2 daughters who live with her, ages 33 and 71 and a 62 year old son. Pt says she doesn't eat meat. She craves bananas and protein shakes. She skips dinner everyday. Pt's heaviest weight was 260 lbs, then had gastric bypass and got down to 183 lbs. She kept it off for a couple of years then gained it back due to stress (husband died Sep 21, 2019 from COVID) and she doesn't eat enough.  Teresa Newton's habits were reviewed today and are as follows: she thinks her family will eat healthier with her, her desired weight loss is 69 lbs, she has been heavy most of her life, her heaviest weight ever was 260 pounds, she is a picky eater and doesn't like to eat healthier foods, she snacks frequently in the evenings, she wakes up frequently in the middle of the night to eat, she skips meals frequently, she is frequently drinking liquids with calories, and she struggles with emotional eating.  Depression Screen Teresa Newton's Food and Mood (modified PHQ-9) score was 15.  Depression screen PHQ 2/9 06/14/2021  Decreased Interest 3  Down, Depressed, Hopeless 2  PHQ - 2 Score 5  Altered  sleeping 2  Tired, decreased energy 3  Change in appetite 2  Feeling bad or failure about yourself  1  Trouble concentrating 2  Moving slowly or fidgety/restless 0  Suicidal thoughts 0  PHQ-9 Score 15  Difficult doing work/chores Not difficult at all      Subjective:   1. Other fatigue Aanchal admits to daytime somnolence and admits to waking up still tired. Patient has a history of symptoms of daytime fatigue and morning fatigue. Teresa Newton generally gets 5 hours of sleep per night, and states that she has poor sleep quality. Snoring is present. Apneic episodes are present. Epworth Sleepiness Score is 11. H/o OSA but with weight loss, she has no symptoms, and she denies it is an issue or concern now. PHQ= 15. Symptoms are stable now and pt has h/o seeing counselor after her husband passed. Medication: None  2. Shortness of breath on exertion Teresa Newton notes increasing shortness of breath with exercising and seems to be worsening over time with weight gain. She notes getting out of breath sooner with activity than she used to. This has gotten worse recently. Teresa Newton denies shortness of breath at rest or orthopnea.  3. Type 2 diabetes mellitus with other specified complication, without long-term current use of insulin (HCC) Pt's highest A1c was 5.8 when she was first diagnosed 10+ years ago. She has been taking Rybelsus 3 mg QD since November 29th- given samples by PCP.  4. Hypertension associated with diabetes (HCC) At goal. Medications: lisinopril,  HCTZ.   5. Mixed diabetic hyperlipidemia associated with type 2 diabetes mellitus (HCC) Teresa Newton is tolerating medication(s) well without side effects.  Medication compliance is good as patient appears to be taking it as prescribed.  The patient denies additional concerns regarding this condition.  Lipid-lowering medications: Crestor 5 mg.   6. B12 deficiency Medication: OTC B12 2500 mcg QD  7. Vitamin D deficiency Medication: OTC Vit D3 5,000 IU  QD  8. S/P gastric sleeve procedure Gastric sleeve done 01/2013 at [facility]. Pt is unsure of surgeon's name.    Assessment/Plan:   Orders Placed This Encounter  Procedures   CBC with Differential/Platelet   Vitamin B12   Insulin, random   Lipid Panel With LDL/HDL Ratio   T4, free   TSH   EKG 12-Lead    Medications Discontinued During This Encounter  Medication Reason   magnesium oxide (MAG-OX) 400 MG tablet    Cholecalciferol (VITAMIN D3) 50 MCG (2000 UT) TABS    omeprazole (PRILOSEC) 40 MG capsule    Semaglutide (RYBELSUS) 3 MG TABS    albuterol (VENTOLIN HFA) 108 (90 Base) MCG/ACT inhaler      No orders of the defined types were placed in this encounter.    1. Other fatigue Teresa Newton does feel that her weight is causing her energy to be lower than it should be. Fatigue may be related to obesity, depression or many other causes. Labs will be ordered, and in the meanwhile, Teresa Newton will focus on self care including making healthy food choices, increasing physical activity and focusing on stress reduction. Check labs today.  - EKG 12-Lead - CBC with Differential/Platelet  2. Shortness of breath on exertion Teresa Newton does feel that she gets out of breath more easily that she used to when she exercises. Teresa Newton's shortness of breath appears to be obesity related and exercise induced. She has agreed to work on weight loss and gradually increase exercise to treat her exercise induced shortness of breath. Will continue to monitor closely.  3. Type 2 diabetes mellitus with other specified complication, without long-term current use of insulin (HCC) Good blood sugar control is important to decrease the likelihood of diabetic complications such as nephropathy, neuropathy, limb loss, blindness, coronary artery disease, and death. Intensive lifestyle modification including diet, exercise and weight loss are the first line of treatment for diabetes. Check labs today.  - CBC with  Differential/Platelet - Insulin, random  4. Hypertension associated with diabetes (HCC) Plan: Avoid buying foods that are: processed, frozen, or prepackaged to avoid excess salt. Check labs today.Continue current treatment plan.  - CBC with Differential/Platelet - T4, free - TSH  5. Mixed diabetic hyperlipidemia associated with type 2 diabetes mellitus (HCC) Plan: Dietary changes: Increase soluble fiber, decrease simple carbohydrates, decrease saturated fat. Exercise changes: Moderate to vigorous-intensity aerobic activity 150 minutes per week or as tolerated. We will continue to monitor along with PCP/specialists as it pertains to her weight loss journey. Check labs today. Continue current treatment plan.  - CBC with Differential/Platelet - Lipid Panel With LDL/HDL Ratio  6. B12 deficiency The diagnosis was reviewed with the patient. Counseling provided today, see below. We will continue to monitor. Orders and follow up as documented in patient record. Continue current treatment plan.  Counseling The body needs vitamin B12: to make red blood cells; to make DNA; and to help the nerves work properly so they can carry messages from the brain to the body.  The main causes of vitamin B12 deficiency include  dietary deficiency, digestive diseases, pernicious anemia, and having a surgery in which part of the stomach or small intestine is removed.  Certain medicines can make it harder for the body to absorb vitamin B12. These medicines include: heartburn medications; some antibiotics; some medications used to treat diabetes, gout, and high cholesterol.  In some cases, there are no symptoms of this condition. If the condition leads to anemia or nerve damage, various symptoms can occur, such as weakness or fatigue, shortness of breath, and numbness or tingling in your hands and feet.   Treatment:  May include taking vitamin B12 supplements.  Avoid alcohol.  Eat lots of healthy foods that contain  vitamin B12: Beef, pork, chicken, [place], and organ meats, such as liver.  Seafood: This includes clams, rainbow trout, salmon, tuna, and haddock. Eggs.  Cereal and dairy products that are fortified: This means that vitamin B12 has been added to the food.  Check labs today.  - Vitamin B12  7. Vitamin D deficiency Low Vitamin D level contributes to fatigue and are associated with obesity, breast, and colon cancer. She agrees to continue to take OTC Vitamin D 5,000 IU QD and will follow-up for routine testing of Vitamin D, at least 2-3 times per year to avoid over-replacement.  8. S/P gastric sleeve procedure Check labs today.  9. Screening for depression Charnay had a positive depression screening. Depression is commonly associated with obesity and often results in emotional eating behaviors. We will monitor this closely and work on CBT to help improve the non-hunger eating patterns. Referral to Psychology may be required if no improvement is seen as she continues in our clinic.  10. Obesity with current BMI of 3.59 Cellia is currently in the action stage of change and her goal is to continue with weight loss efforts. I recommend Genera begin the structured treatment plan as follows:  She has agreed to the [employer].  Exercise goals:  As is    Behavioral modification strategies: increasing lean protein intake and planning for success.  She was informed of the importance of frequent follow-up visits to maximize her success with intensive lifestyle modifications for her multiple health conditions. She was informed we would discuss her lab results at her next visit unless there is a critical issue that needs to be addressed sooner. Virgil agreed to keep her next visit at the agreed upon time to discuss these results.    Objective:   Blood pressure 111/73, pulse 63, temperature 97.9 F (36.6 C), height 5\' 4"  (1.626 m), weight 209 lb (94.8 kg), SpO2 100 %. Body mass index is 35.87  kg/m.  EKG: Abnormal- sinus rhythm, rate 58 bradycardia.  Indirect Calorimeter completed today shows a VO2 of 207 and a REE of 1426.  Her calculated basal metabolic rate is 0600 thus her basal metabolic rate is worse than expected.  General: Cooperative, alert, well developed, in no acute distress. HEENT: Conjunctivae and lids unremarkable. Cardiovascular: Regular rhythm.  Lungs: Normal work of breathing. Neurologic: No focal deficits.   Lab Results  Component Value Date   CREATININE 0.79 04/04/2021   BUN 20 04/04/2021   NA 139 04/04/2021   K 4.3 04/04/2021   CL 100 04/04/2021   CO2 28 04/04/2021   Lab Results  Component Value Date   ALT 29 11/09/2020   AST 38 11/09/2020   ALKPHOS 88 11/09/2020   BILITOT 0.6 11/09/2020   Lab Results  Component Value Date   HGBA1C 5.8 (H) 04/04/2021   HGBA1C  5.8 (H) 11/09/2020   HGBA1C 5.7 (H) 11/17/2019   HGBA1C 5.8 (H) 05/04/2019   HGBA1C 5.6 11/10/2018   Lab Results  Component Value Date   INSULIN 6.2 06/14/2021   Lab Results  Component Value Date   TSH 0.673 06/14/2021   Lab Results  Component Value Date   CHOL 132 06/14/2021   HDL 64 06/14/2021   LDLCALC 54 06/14/2021   TRIG 68 06/14/2021   CHOLHDL 2.7 11/09/2020   Lab Results  Component Value Date   WBC 5.0 06/14/2021   HGB 13.1 06/14/2021   HCT 38.4 06/14/2021   MCV 90 06/14/2021   PLT 281 06/14/2021   No results found for: IRON, TIBC, FERRITIN Obesity Behavioral Intervention:   Approximately 15 minutes were spent on the discussion below.  ASK: We discussed the diagnosis of obesity with Darral DashEsther today and Darral Dashsther agreed to give us permission to discuss obesity behavioral modification therapy today.  ASSESS: Darral Dashsther has the diagnosis of obesity and her BMI today is 35.9. Darral Dashsther is in the action stage of change.   ADVISE: Darral Dashsther was educated on the multiple health risks of obesity as well as the benefit of weight loss to improve her health. She was advised of  the need for long term treatment and the importance of lifestyle modifications to improve her current health and to decrease her risk of future health problems.  AGREE: Multiple dietary modification options and treatment options were discussed and Darral Dashsther agreed to follow the recommendations documented in the above note.  ARRANGE: Darral Dashsther was educated on the importance of frequent visits to treat obesity as outlined per CMS and USPSTF guidelines and agreed to schedule her next follow up appointment today.  Attestation Statements:   Reviewed by clinician on day of visit: allergies, medications, problem list, medical history, surgical history, family history, social history, and previous encounter notes.  Edmund HildaI, Tamesha Frazier, CMA, am acting as transcriptionist for Marsh & McLennanDeborah Shekia Kuper, DO.  I have reviewed the above documentation for accuracy and completeness, and I agree with the above. [name]- Nichlas Pitera J Laporsche Hoeger, D.O.  The 21st Century Cures Act was signed into law in 2016 which includes the topic of electronic health records.  This provides immediate access to information in MyChart.  This includes consultation notes, operative notes, office notes, lab results and pathology reports.  If you have any questions about what you read please let us know at your next visit so we can discuss your concerns and take corrective action if need be.  We are right here with you.

## 2021-06-28 ENCOUNTER — Ambulatory Visit (INDEPENDENT_AMBULATORY_CARE_PROVIDER_SITE_OTHER): Payer: Medicare PPO | Admitting: Family Medicine

## 2021-06-28 ENCOUNTER — Encounter (INDEPENDENT_AMBULATORY_CARE_PROVIDER_SITE_OTHER): Payer: Self-pay | Admitting: Family Medicine

## 2021-06-28 ENCOUNTER — Other Ambulatory Visit: Payer: Self-pay

## 2021-06-28 VITALS — BP 127/78 | HR 74 | Temp 97.7°F | Ht 64.0 in | Wt 210.0 lb

## 2021-06-28 DIAGNOSIS — E785 Hyperlipidemia, unspecified: Secondary | ICD-10-CM | POA: Diagnosis not present

## 2021-06-28 DIAGNOSIS — E538 Deficiency of other specified B group vitamins: Secondary | ICD-10-CM

## 2021-06-28 DIAGNOSIS — E1169 Type 2 diabetes mellitus with other specified complication: Secondary | ICD-10-CM

## 2021-06-28 DIAGNOSIS — E1159 Type 2 diabetes mellitus with other circulatory complications: Secondary | ICD-10-CM | POA: Diagnosis not present

## 2021-06-28 DIAGNOSIS — E559 Vitamin D deficiency, unspecified: Secondary | ICD-10-CM | POA: Diagnosis not present

## 2021-06-28 DIAGNOSIS — Z6835 Body mass index (BMI) 35.0-35.9, adult: Secondary | ICD-10-CM

## 2021-06-28 DIAGNOSIS — Z6836 Body mass index (BMI) 36.0-36.9, adult: Secondary | ICD-10-CM

## 2021-06-28 DIAGNOSIS — Z7984 Long term (current) use of oral hypoglycemic drugs: Secondary | ICD-10-CM | POA: Diagnosis not present

## 2021-06-28 DIAGNOSIS — I152 Hypertension secondary to endocrine disorders: Secondary | ICD-10-CM | POA: Diagnosis not present

## 2021-06-28 DIAGNOSIS — E669 Obesity, unspecified: Secondary | ICD-10-CM

## 2021-06-28 MED ORDER — D3 5000 125 MCG (5000 UT) PO CAPS: ORAL_CAPSULE | ORAL | Status: AC

## 2021-06-28 MED ORDER — B-12 500 MCG PO TABS: ORAL_TABLET | ORAL | Status: AC

## 2021-06-29 NOTE — Progress Notes (Signed)
Chief Complaint:   OBESITY Teresa Newton is here to discuss her progress with her obesity treatment plan along with follow-up of her obesity related diagnoses. Teresa Newton is on the [employer] and states she is following her eating plan approximately 50% of the time. Teresa Newton states she is not currently exercising.  Today's visit was #: 2 Starting weight: 209 lbs Starting date: 06/14/2021 Today's weight: 210 lbs Today's date: 06/28/2021 Total lbs lost to date: 0 Total lbs lost since last in-office visit: 0  Interim History: Teresa Newton is here today for her first follow-up office visit since starting the program with [place].  All blood work/ lab tests that were recently ordered by myself or an outside provider were reviewed with patient today per their request.   Extended time was spent counseling her on all new disease processes that were discovered or preexisting ones that are affected by BMI.  she understands that many of these abnormalities will need to monitored regularly along with the current treatment plan of prudent dietary changes, in which we are making each and every office visit, to improve these health parameters. Teresa Newton eats breakfast and lunch on the plan, but not all the food she needs to take in. Mostly skips dinner, then snacks on chips and unhealthy items Teresa Newton is what she loves).  Subjective:   1. Type 2 diabetes mellitus with other specified complication, without long-term current use of insulin (HCC) Teresa Newton is taking Rybelsus. Her A1c is 5.8 and fasting insulin is 6.2. She is without hunger or cravings when she eats. She was started on a sample of Rybelsus 3 mg in November by her primary care physician. I discussed labs with the patient today.  2. Hypertension associated with diabetes (HCC) Teresa Newton is taking hydrochlorothiazide and lisinopril. She is tolerating medication(s) well without side effects. Medication compliance is good and patient appears to be taking it as  prescribed. Her blood pressure is at goal today, and her serum creatinine is within normal limits. The patient denies additional concerns regarding this condition. I discussed labs with the patient today.  BP Readings from Last 3 Encounters:  06/28/21 127/78  06/14/21 111/73  04/04/21 122/70   3. Hyperlipidemia associated with type 2 diabetes mellitus (HCC) Teresa Newton is taking Crestor. She has been on cholesterol medications for a couple of years now with no issues. Her LDL is at goal of <70, HDL is >60+, and lipid panel looks great. I discussed labs with the patient today.  4. Vitamin D deficiency Teresa Newton is taking Prescription Vit D once weekly. Her Vit D level is still above goal at 73.4. she is asymptomatic with no concerns. I discussed labs with the patient today.  5. B12 deficiency Teresa Newton has just taken B12 on her own to help with energy. She is on B12 1200 mcg daily. I discussed labs with the patient today.  Assessment/Plan:  No orders of the defined types were placed in this encounter.   Medications Discontinued During This Encounter  Medication Reason   Cyanocobalamin (B-12 PO)    Cholecalciferol (D3 5000) 125 MCG (5000 UT) capsule      Meds ordered this encounter  Medications   Cyanocobalamin (B-12) 500 MCG TABS    Sig: 400- 500 mcg daily   Cholecalciferol (D3 5000) 125 MCG (5000 UT) capsule    Sig: 5,000 IU every other day     1. Type 2 diabetes mellitus with other specified complication, without long-term current use of insulin (HCC) Teresa Newton will  continue Rybelsus at 3 mg. Good blood sugar control is important to decrease the likelihood of diabetic complications such as nephropathy, neuropathy, limb loss, blindness, coronary artery disease, and death. Intensive lifestyle modification including diet, exercise and weight loss are the first line of treatment for diabetes.   2. Hypertension associated with diabetes (HCC) Teresa Newton will decrease salt and follow her prudent  nutritional plan to improve blood pressure control. We will continue to follow closely as she continues her lifestyle modifications.  3. Hyperlipidemia associated with type 2 diabetes mellitus (HCC) Cardiovascular risk and specific lipid/LDL goals reviewed. We discussed several lifestyle modifications today. We will recheck labs in 4 months. We will consider changing dose if Teresa Newton is following her meal plan of low saturated and trans fats. Orders and follow up as documented in patient record.   Counseling Intensive lifestyle modifications are the first line treatment for this issue. Dietary changes: Increase soluble fiber. Decrease simple carbohydrates. Exercise changes: Moderate to vigorous-intensity aerobic activity 150 minutes per week if tolerated. Lipid-lowering medications: see documented in medical record.  4. Vitamin D deficiency I recommended that Teresa Newton cuts her dose in half or take 5,000 IU every other day instead of daily. She was counseled on Vitamin D deficiency. We will recheck labs in 3 months, and she will follow-up for routine testing of Vitamin D, at least 2-3 times per year to avoid over-replacement.  - Cholecalciferol (D3 5000) 125 MCG (5000 UT) capsule; 5,000 IU every other day  5. B12 deficiency The diagnosis was reviewed with the patient. Teresa Newton agreed to decrease her intake of B12 from 1200 mcg daily to 400-500 mcg daily. We will recheck labs in 3 months. B12 counseling was done today. Orders and follow up as documented in patient record.  Counseling The body needs vitamin B12: to make red blood cells; to make DNA; and to help the nerves work properly so they can carry messages from the brain to the body.  The main causes of vitamin B12 deficiency include dietary deficiency, digestive diseases, pernicious anemia, and having a surgery in which part of the stomach or small intestine is removed.  Certain medicines can make it harder for the body to absorb vitamin B12.  These medicines include: heartburn medications; some antibiotics; some medications used to treat diabetes, gout, and high cholesterol.  In some cases, there are no symptoms of this condition. If the condition leads to anemia or nerve damage, various symptoms can occur, such as weakness or fatigue, shortness of breath, and numbness or tingling in your hands and feet.   Treatment:  May include taking vitamin B12 supplements.  Avoid alcohol.  Eat lots of healthy foods that contain vitamin B12: Beef, pork, chicken, [place], and organ meats, such as liver.  Seafood: This includes clams, rainbow trout, salmon, tuna, and haddock. Eggs.  Cereal and dairy products that are fortified: This means that vitamin B12 has been added to the food.   - Cyanocobalamin (B-12) 500 MCG TABS; 400- 500 mcg daily  6. Obesity with current BMI of 36.1 Teresa Newton is currently in the action stage of change. As such, her goal is to continue with weight loss efforts. She has agreed to the [employer].   Gave patient additional meal plan handouts and reviewed several concepts with her about what various nutrients does to her body.  Exercise goals: As is.  Behavioral modification strategies: decreasing simple carbohydrates, no skipping meals, and planning for success.  Teresa Newton has agreed to follow-up with our clinic in  2 weeks. She was informed of the importance of frequent follow-up visits to maximize her success with intensive lifestyle modifications for her multiple health conditions.   Objective:   Blood pressure 127/78, pulse 74, temperature 97.7 F (36.5 C), height 5\' 4"  (1.626 m), weight 210 lb (95.3 kg), SpO2 99 %. Body mass index is 36.05 kg/m.  General: Cooperative, alert, well developed, in no acute distress. HEENT: Conjunctivae and lids unremarkable. Cardiovascular: Regular rhythm.  Lungs: Normal work of breathing. Neurologic: No focal deficits.   Lab Results  Component Value Date   CREATININE 0.79  04/04/2021   BUN 20 04/04/2021   NA 139 04/04/2021   K 4.3 04/04/2021   CL 100 04/04/2021   CO2 28 04/04/2021   Lab Results  Component Value Date   ALT 29 11/09/2020   AST 38 11/09/2020   ALKPHOS 88 11/09/2020   BILITOT 0.6 11/09/2020   Lab Results  Component Value Date   HGBA1C 5.8 (H) 04/04/2021   HGBA1C 5.8 (H) 11/09/2020   HGBA1C 5.7 (H) 11/17/2019   HGBA1C 5.8 (H) 05/04/2019   HGBA1C 5.6 11/10/2018   Lab Results  Component Value Date   INSULIN 6.2 06/14/2021   Lab Results  Component Value Date   TSH 0.673 06/14/2021   Lab Results  Component Value Date   CHOL 132 06/14/2021   HDL 64 06/14/2021   LDLCALC 54 06/14/2021   TRIG 68 06/14/2021   CHOLHDL 2.7 11/09/2020   Lab Results  Component Value Date   VD25OH 73.4 04/04/2021   VD25OH 105.0 (H) 11/09/2020   Lab Results  Component Value Date   WBC 5.0 06/14/2021   HGB 13.1 06/14/2021   HCT 38.4 06/14/2021   MCV 90 06/14/2021   PLT 281 06/14/2021   No results found for: IRON, TIBC, FERRITIN  Obesity Behavioral Intervention:   Approximately 15 minutes were spent on the discussion below.  ASK: We discussed the diagnosis of obesity with 08/12/2021 today and Teresa Newton agreed to give Teresa Newton permission to discuss obesity behavioral modification therapy today.  ASSESS: Teresa Newton has the diagnosis of obesity and her BMI today is 36.1. Teresa Newton is in the action stage of change.   ADVISE: Teresa Newton was educated on the multiple health risks of obesity as well as the benefit of weight loss to improve her health. She was advised of the need for long term treatment and the importance of lifestyle modifications to improve her current health and to decrease her risk of future health problems.  AGREE: Multiple dietary modification options and treatment options were discussed and Adryana agreed to follow the recommendations documented in the above note.  ARRANGE: Teresa Newton was educated on the importance of frequent visits to treat obesity  as outlined per CMS and USPSTF guidelines and agreed to schedule her next follow up appointment today.  Attestation Statements:   Reviewed by clinician on day of visit: allergies, medications, problem list, medical history, surgical history, family history, social history, and previous encounter notes.   Teresa Newton, am acting as transcriptionist for Trude Mcburney, DO.  I have reviewed the above documentation for accuracy and completeness, and I agree with the above. [employer], D.O.  The 21st Century Cures Act was signed into law in 2016 which includes the topic of electronic health records.  This provides immediate access to information in MyChart.  This includes consultation notes, operative notes, office notes, lab results and pathology reports.  If you have any questions about what you read  please let us know at your next visit so we can discuss your concerns and take corrective action if need be.  We are right here with you.

## 2021-07-06 ENCOUNTER — Other Ambulatory Visit: Payer: Self-pay

## 2021-07-06 ENCOUNTER — Encounter: Payer: Self-pay | Admitting: Internal Medicine

## 2021-07-06 ENCOUNTER — Ambulatory Visit: Payer: Medicare PPO | Admitting: Internal Medicine

## 2021-07-06 VITALS — BP 122/70 | HR 74 | Temp 98.2°F | Ht 64.0 in | Wt 214.8 lb

## 2021-07-06 DIAGNOSIS — Z2821 Immunization not carried out because of patient refusal: Secondary | ICD-10-CM

## 2021-07-06 DIAGNOSIS — E1159 Type 2 diabetes mellitus with other circulatory complications: Secondary | ICD-10-CM

## 2021-07-06 DIAGNOSIS — E669 Obesity, unspecified: Secondary | ICD-10-CM | POA: Diagnosis not present

## 2021-07-06 DIAGNOSIS — Z6836 Body mass index (BMI) 36.0-36.9, adult: Secondary | ICD-10-CM | POA: Diagnosis not present

## 2021-07-06 DIAGNOSIS — I152 Hypertension secondary to endocrine disorders: Secondary | ICD-10-CM

## 2021-07-06 DIAGNOSIS — E1169 Type 2 diabetes mellitus with other specified complication: Secondary | ICD-10-CM | POA: Diagnosis not present

## 2021-07-06 LAB — HEMOGLOBIN A1C
Est. average glucose Bld gHb Est-mCnc: 126 mg/dL
Hgb A1c MFr Bld: 6 % — ABNORMAL HIGH (ref 4.8–5.6)

## 2021-07-06 MED ORDER — 5-HTP 50 MG PO CAPS: 50.0000 mg | ORAL_CAPSULE | Freq: Every day | ORAL | 0 refills | Status: DC

## 2021-07-06 NOTE — Patient Instructions (Addendum)
5-HTP - natural supplement to help with mood ? ?Exercising to Lose Weight ?Getting regular exercise is important for everyone. It is especially important if you are overweight. Being overweight increases your risk of heart disease, stroke, diabetes, high blood pressure, and several types of cancer. Exercising, and reducing the calories you consume, can help you lose weight and improve fitness and health. ?Exercise can be moderate or vigorous intensity. To lose weight, most people need to do a certain amount of moderate or vigorous-intensity exercise each week. ?How can exercise affect me? ?You lose weight when you exercise enough to burn more calories than you eat. Exercise also reduces body fat and builds muscle. The more muscle you have, the more calories you burn. Exercise also: ?Improves mood. ?Reduces stress and tension. ?Improves your overall fitness, flexibility, and endurance. ?Increases bone strength. ?Moderate-intensity exercise ?Moderate-intensity exercise is any activity that gets you moving enough to burn at least three times more energy (calories) than if you were sitting. ?Examples of moderate exercise include: ?Walking a mile in 15 minutes. ?Doing light yard work. ?Biking at an easy pace. ?Most people should get at least 150 minutes of moderate-intensity exercise a week to maintain their body weight. ?Vigorous-intensity exercise ?Vigorous-intensity exercise is any activity that gets you moving enough to burn at least six times more calories than if you were sitting. When you exercise at this intensity, you should be working hard enough that you are not able to carry on a conversation. ?Examples of vigorous exercise include: ?Running. ?Playing a team sport, such as football, basketball, and soccer. ?Jumping rope. ?Most people should get at least 75 minutes a week of vigorous exercise to maintain their body weight. ?What actions can I take to lose weight? ?The amount of exercise you need to lose weight  depends on: ?Your age. ?The type of exercise. ?Any health conditions you have. ?Your overall physical ability. ?Talk to your health care provider about how much exercise you need and what types of activities are safe for you. ?Nutrition ? ?Make changes to your diet as told by your health care provider or diet and nutrition specialist (dietitian). This may include: ?Eating fewer calories. ?Eating more protein. ?Eating less unhealthy fats. ?Eating a diet that includes fresh fruits and vegetables, whole grains, low-fat dairy products, and lean protein. ?Avoiding foods with added fat, salt, and sugar. ?Drink plenty of water while you exercise to prevent dehydration or heat stroke. ?Activity ?Choose an activity that you enjoy and set realistic goals. Your health care provider can help you make an exercise plan that works for you. ?Exercise at a moderate or vigorous intensity most days of the week. ?The intensity of exercise may vary from person to person. You can tell how intense a workout is for you by paying attention to your breathing and heartbeat. Most people will notice their breathing and heartbeat get faster with more intense exercise. ?Do resistance training twice each week, such as: ?Push-ups. ?Sit-ups. ?Lifting weights. ?Using resistance bands. ?Getting short amounts of exercise can be just as helpful as long, structured periods of exercise. If you have trouble finding time to exercise, try doing these things as part of your daily routine: ?Get up, stretch, and walk around every 30 minutes throughout the day. ?Go for a walk during your lunch break. ?Park your car farther away from your destination. ?If you take public transportation, get off one stop early and walk the rest of the way. ?Make phone calls while standing up and walking around. ?  Take the stairs instead of elevators or escalators. ?Wear comfortable clothes and shoes with good support. ?Do not exercise so much that you hurt yourself, feel dizzy, or  get very short of breath. ?Where to find more information ?[place] Department of Health and Human Services: ThisPath.fi ?Centers for Disease Control and Prevention: FootballExhibition.com.br ?Contact a health care provider: ?Before starting a new exercise program. ?If you have questions or concerns about your weight. ?If you have a medical problem that keeps you from exercising. ?Get help right away if: ?You have any of the following while exercising: ?Injury. ?Dizziness. ?Difficulty breathing or shortness of breath that does not go away when you stop exercising. ?Chest pain. ?Rapid heartbeat. ?These symptoms may represent a serious problem that is an emergency. Do not wait to see if the symptoms will go away. Get medical help right away. Call your local emergency services (911 in the [place] Do not drive yourself to the hospital. ?Summary ?Getting regular exercise is especially important if you are overweight. ?Being overweight increases your risk of heart disease, stroke, diabetes, high blood pressure, and several types of cancer. ?Losing weight happens when you burn more calories than you eat. ?Reducing the amount of calories you eat, and getting regular moderate or vigorous exercise each week, helps you lose weight. ?This information is not intended to replace advice given to you by your health care provider. Make sure you discuss any questions you have with your health care provider. ?Document Revised: 06/19/2020 Document Reviewed: 06/19/2020 ?Elsevier Patient Education ? 2022 Elsevier Inc. ? ?

## 2021-07-06 NOTE — Progress Notes (Signed)
?[name],acting as a Neurosurgeon for Teresa Aliment, MD.,have documented all relevant documentation on the behalf of Teresa Aliment, MD,as directed by  Teresa Aliment, MD while in the presence of Teresa Aliment, MD.  ?This visit occurred during the SARS-CoV-2 public health emergency.  Safety protocols were in place, including screening questions prior to the visit, additional usage of staff PPE, and extensive cleaning of exam room while observing appropriate contact time as indicated for disinfecting solutions. ? ?Subjective:  ?  ? Patient ID: Teresa Newton , female    DOB: 16-May-1955 , 66 y.o.   MRN: [id number] ? ? ?Chief Complaint  ?Patient presents with  ? Diabetes  ? Weight Check  ? ? ?HPI ? ?Patient presents today for diabetes f/u. Patient is taking Rybelsus and is tolerating it well. Patient does not have any concerns or questions at this time.  She adds that she has started walking more regularly as well.  ? ? ?Diabetes ?She presents for her follow-up diabetic visit. She has type 2 diabetes mellitus. Her disease course has been stable. There are no hypoglycemic associated symptoms. Pertinent negatives for diabetes include no blurred vision, no chest pain, no polydipsia, no polyphagia and no polyuria. There are no hypoglycemic complications. Risk factors for coronary artery disease include diabetes mellitus, dyslipidemia, hypertension, obesity, post-menopausal and sedentary lifestyle. She is following a generally healthy and diabetic diet. She never participates in exercise. Her breakfast blood glucose is taken between 7-8 am. Her breakfast blood glucose range is generally 90-110 mg/dl. An ACE inhibitor/angiotensin II receptor blocker is being taken. Eye exam is current.  ?Hypertension ?This is a chronic problem. The current episode started more than 1 year ago. The problem has been gradually improving since onset. The problem is controlled. Pertinent negatives include no blurred vision, chest  pain, palpitations or shortness of breath.   ? ?Past Medical History:  ?Diagnosis Date  ? Back pain   ? Bilateral edema of lower extremity   ? Bimalleolar ankle fracture   ? right  ? Constipation   ? Diabetes (HCC)   ? Food allergy   ? GERD (gastroesophageal reflux disease)   ? Hyperlipidemia   ? Hypertension   ? Joint pain   ? Lactose intolerance   ? LTBI (latent tuberculosis infection)   ? Other fatigue   ? Plaque psoriasis   ? Pre-diabetes   ? Shortness of breath on exertion   ? Sleep apnea   ? gastric bypass, lost 70lbs and no longer needs CPAP  ? Stomach ulcer   ?  ? ?Family History  ?Problem Relation Age of Onset  ? Tuberculosis Mother   ? Heart Problems Mother   ? Dementia Mother   ? Stroke Mother   ? Alcoholism Father   ? Ovarian cancer Sister   ? Psoriasis Brother   ? Breast cancer Paternal Aunt 41  ? Alcoholism Other   ? ? ? ?Current Outpatient Medications:  ?  aspirin 81 MG tablet, Take 81 mg by mouth daily., Disp: , Rfl:  ?  CALCIUM PO, Take 650 mg by mouth daily., Disp: , Rfl:  ?  Cholecalciferol (D3 5000) 125 MCG (5000 UT) capsule, 5,000 IU every other day, Disp: , Rfl:  ?  COSENTYX 150 MG/ML SOSY, Inject into the skin every 30 (thirty) days., Disp: , Rfl:  ?  Cyanocobalamin (B-12) 500 MCG TABS, 400- 500 mcg daily, Disp: , Rfl:  ?  ELDERBERRY PO, Take 50 mg by  mouth daily., Disp: , Rfl:  ?  EPINEPHRINE 0.3 mg/0.3 mL IJ SOAJ injection, INJECT AS DIRECTED FOR ANAPHYLAXIS SHOCK, Disp: 1 Device, Rfl: 2 ?  Ginger 500 MG CAPS, Take 2 capsules by mouth daily., Disp: , Rfl:  ?  hydrochlorothiazide (MICROZIDE) 12.5 MG capsule, TAKE 1 CAPSULE BY MOUTH EVERY DAY, Disp: 90 capsule, Rfl: 1 ?  lisinopril (ZESTRIL) 5 MG tablet, TAKE 1 TABLET BY MOUTH EVERY DAY, Disp: 90 tablet, Rfl: 1 ?  Magnesium 250 MG TABS, Take 1 tablet by mouth daily., Disp: , Rfl:  ?  POTASSIUM PO, Take 99 mg by mouth daily., Disp: , Rfl:  ?  Prenatal Vit-Fe Fumarate-FA (PRENATAL PO), Take 2 tablets by mouth daily., Disp: , Rfl:  ?   rosuvastatin (CRESTOR) 5 MG tablet, TAKE 1 TABLET (5 MG TOTAL) BY MOUTH DAILY., Disp: 90 tablet, Rfl: 1 ?  Turmeric 500 MG CAPS, Take 2 tablets by mouth daily., Disp: , Rfl:   ? ?Allergies  ?Allergen Reactions  ? Fish Allergy Anaphylaxis  ?  Cod fish   ? Milk-Related Compounds Anaphylaxis  ?  Goat Milk ONLY   ? Penicillins Hives  ?  ? ?Review of Systems  ?Constitutional: Negative.   ?Eyes: Negative.  Negative for blurred vision.  ?Respiratory: Negative.  Negative for shortness of breath.   ?Cardiovascular: Negative.  Negative for chest pain and palpitations.  ?Gastrointestinal: Negative.   ?Endocrine: Negative for polydipsia, polyphagia and polyuria.  ?Musculoskeletal: Negative.   ?Skin: Negative.   ?Neurological: Negative.   ?Psychiatric/Behavioral: Negative.     ? ?Today's Vitals  ? 07/06/21 0957  ?BP: 122/70  ?Pulse: 74  ?Temp: 98.2 ?F (36.8 ?C)  ?Weight: 214 lb 12.8 oz (97.4 kg)  ?Height: 5\' 4"  (1.626 m)  ?PainSc: 0-No pain  ? ?Body mass index is 36.87 kg/m?.  ?Wt Readings from Last 3 Encounters:  ?07/06/21 214 lb 12.8 oz (97.4 kg)  ?06/28/21 210 lb (95.3 kg)  ?06/14/21 209 lb (94.8 kg)  ?  ? ?Objective:  ?Physical Exam ?Vitals and nursing note reviewed.  ?Constitutional:   ?   Appearance: Normal appearance. She is obese.  ?HENT:  ?   Head: Normocephalic and atraumatic.  ?   Nose:  ?   Comments: Masked  ?   Mouth/Throat:  ?   Comments: Masked  ?Eyes:  ?   Extraocular Movements: Extraocular movements intact.  ?Cardiovascular:  ?   Rate and Rhythm: Normal rate and regular rhythm.  ?   Heart sounds: Normal heart sounds.  ?Pulmonary:  ?   Effort: Pulmonary effort is normal.  ?   Breath sounds: Normal breath sounds.  ?Musculoskeletal:  ?   Cervical back: Normal range of motion.  ?Skin: ?   General: Skin is warm.  ?Neurological:  ?   General: No focal deficit present.  ?   Mental Status: She is alert.  ?Psychiatric:     ?   Mood and Affect: Mood normal.     ?   Behavior: Behavior normal.  ?  ? ?   ?Assessment And  Plan:  ?   ?1. Obesity, diabetes, and hypertension syndrome (HCC) ?Comments: BP well controlled. No med changes. I will check labs as below and adjust meds as needed. She will f/u in 4 months. Advised to exercise at least 150 min/week.  ?- Hemoglobin A1c ? ?2. Class 2 severe obesity due to excess calories with serious comorbidity and body mass index (BMI) of 36.0 to 36.9 in adult [facility]) ?Comments:  She is encouraged to strive to lose 10% of her body weight to decrease cardiac risk. Encouraged to incorporate strength training into her exercise routine.  ? ?3. Herpes zoster vaccination declined ?  ?Patient was given opportunity to ask questions. Patient verbalized understanding of the plan and was able to repeat key elements of the plan. All questions were answered to their satisfaction.  ? ?I, Teresa Aliment, MD, have reviewed all documentation for this visit. The documentation on 07/06/21 for the exam, diagnosis, procedures, and orders are all accurate and complete.  ? ?IF YOU HAVE BEEN REFERRED TO A SPECIALIST, IT MAY TAKE 1-2 WEEKS TO SCHEDULE/PROCESS THE REFERRAL. IF YOU HAVE NOT HEARD FROM US/SPECIALIST IN TWO WEEKS, PLEASE GIVE [place] A CALL AT [phone] X [phone].  ? ?THE PATIENT IS ENCOURAGED TO PRACTICE SOCIAL DISTANCING DUE TO THE COVID-19 PANDEMIC.   ?

## 2021-07-11 ENCOUNTER — Other Ambulatory Visit: Payer: Self-pay | Admitting: Internal Medicine

## 2021-07-11 DIAGNOSIS — Z1231 Encounter for screening mammogram for malignant neoplasm of breast: Secondary | ICD-10-CM

## 2021-07-12 ENCOUNTER — Ambulatory Visit
Admission: RE | Admit: 2021-07-12 | Discharge: 2021-07-12 | Disposition: A | Discharge status: Home / Self Care | Payer: Medicare PPO | Source: Ambulatory Visit | Attending: Internal Medicine | Admitting: Internal Medicine

## 2021-07-12 DIAGNOSIS — E2839 Other primary ovarian failure: Secondary | ICD-10-CM

## 2021-07-12 DIAGNOSIS — Z780 Asymptomatic menopausal state: Secondary | ICD-10-CM | POA: Diagnosis not present

## 2021-07-12 DIAGNOSIS — M85852 Other specified disorders of bone density and structure, left thigh: Secondary | ICD-10-CM | POA: Diagnosis not present

## 2021-07-13 ENCOUNTER — Ambulatory Visit (INDEPENDENT_AMBULATORY_CARE_PROVIDER_SITE_OTHER): Payer: Medicare PPO | Admitting: Family Medicine

## 2021-07-27 ENCOUNTER — Ambulatory Visit (INDEPENDENT_AMBULATORY_CARE_PROVIDER_SITE_OTHER): Payer: Medicare PPO | Admitting: Family Medicine

## 2021-07-27 ENCOUNTER — Other Ambulatory Visit: Payer: Self-pay

## 2021-07-27 ENCOUNTER — Encounter (INDEPENDENT_AMBULATORY_CARE_PROVIDER_SITE_OTHER): Payer: Self-pay | Admitting: Family Medicine

## 2021-07-27 VITALS — BP 136/77 | HR 68 | Temp 98.4°F | Ht 64.0 in | Wt 212.0 lb

## 2021-07-27 DIAGNOSIS — E1169 Type 2 diabetes mellitus with other specified complication: Secondary | ICD-10-CM

## 2021-07-27 DIAGNOSIS — Z6836 Body mass index (BMI) 36.0-36.9, adult: Secondary | ICD-10-CM | POA: Diagnosis not present

## 2021-07-27 DIAGNOSIS — Z7984 Long term (current) use of oral hypoglycemic drugs: Secondary | ICD-10-CM | POA: Diagnosis not present

## 2021-07-27 DIAGNOSIS — E669 Obesity, unspecified: Secondary | ICD-10-CM

## 2021-07-27 DIAGNOSIS — E66812 Obesity, class 2: Secondary | ICD-10-CM

## 2021-07-27 MED ORDER — RYBELSUS 3 MG PO TABS: 1.0000 | ORAL_TABLET | Freq: Every day | ORAL | 0 refills | Status: DC

## 2021-08-07 NOTE — Progress Notes (Signed)
? ? ? ?Chief Complaint:  ? ?OBESITY ?Teresa Newton is here to discuss her progress with her obesity treatment plan along with follow-up of her obesity related diagnoses. Teresa Newton is on the [employer] and states she is following her eating plan approximately 70% of the time. [name] states she is walking for 16 minutes 3 times per week. ? ?Today's visit was #: 3 ?Starting weight: 209 lbs ?Starting date: 06/14/2021 ?Today's weight: 212 lbs ?Today's date: 07-27-2021 ?Total lbs lost to date: 0 ?Total lbs lost since last in-office visit: 0 ? ?Interim History: Teresa Newton will be moving to [place] [facility] in the summertime.  "I just can't eat all the day" at dinner - mostly skips it.  She says her Pescatarian meal plan is just "too much food".  She hit her goals with walking!   ? ?We reviewed new meal plan - Category 1 with breakfast options.  Teresa Newton feels energized and enthusiastic about the future. ? ?Subjective:  ? ?1. Type 2 diabetes mellitus with other specified complication, without long-term current use of insulin (HCC) ?PCP started patient on Rybelsus in November.  FBS 90s-109. ? ?Assessment/Plan:  ?No orders of the defined types were placed in this encounter. ? ? ?Medications Discontinued During This Encounter  ?Medication Reason  ? Semaglutide (RYBELSUS) 3 MG TABS Reorder  ?  ? ?Meds ordered this encounter  ?Medications  ? Semaglutide (RYBELSUS) 3 MG TABS  ?  Sig: Take 1 tablet by mouth daily.  ?  Dispense:  30 tablet  ?  Refill:  0  ?  30 d supply;  ** OV for RF **   Do not send RF request  ?  ? ?1. Type 2 diabetes mellitus with other specified complication, without long-term current use of insulin (HCC) ?Refill Rybelsus.  No change in dose. ?Continue prudent nutritional plan and decrease simple carbs, continue with weight loss. ? ?- Refill Semaglutide (RYBELSUS) 3 MG TABS; Take 1 tablet by mouth daily.  Dispense: 30 tablet; Refill: 0 ? ?2. Obesity with current BMI of 36.4 ? ?Teresa Newton is currently in the action stage of change. As  such, her goal is to continue with weight loss efforts. She has agreed to the Category 1 Plan with breakfast options..  ? ?Exercise goals:  Walking for 20 minutes 3 days per week. ? ?Behavioral modification strategies: increasing lean protein intake, no skipping meals, and planning for success. ? ?Teresa Newton has agreed to follow-up with our clinic in 3 weeks. She was informed of the importance of frequent follow-up visits to maximize her success with intensive lifestyle modifications for her multiple health conditions.  ? ?Objective:  ? ?Blood pressure 136/77, pulse 68, temperature 98.4 ?F (36.9 ?C), height 5\' 4"  (1.626 m), weight 212 lb (96.2 kg), SpO2 99 %. ?Body mass index is 36.39 kg/m?. ? ?General: Cooperative, alert, well developed, in no acute distress. ?HEENT: Conjunctivae and lids unremarkable. ?Cardiovascular: Regular rhythm.  ?Lungs: Normal work of breathing. ?Neurologic: No focal deficits.  ? ?Lab Results  ?Component Value Date  ? CREATININE 0.79 04/04/2021  ? BUN 20 04/04/2021  ? NA 139 04/04/2021  ? K 4.3 04/04/2021  ? CL 100 04/04/2021  ? CO2 28 04/04/2021  ? ?Lab Results  ?Component Value Date  ? ALT 29 11/09/2020  ? AST 38 11/09/2020  ? ALKPHOS 88 11/09/2020  ? BILITOT 0.6 11/09/2020  ? ?Lab Results  ?Component Value Date  ? HGBA1C 6.0 (H) 07/06/2021  ? HGBA1C 5.8 (H) 04/04/2021  ? HGBA1C 5.8 (H) 11/09/2020  ?  HGBA1C 5.7 (H) 11/17/2019  ? HGBA1C 5.8 (H) 05/04/2019  ? ?Lab Results  ?Component Value Date  ? INSULIN 6.2 06/14/2021  ? ?Lab Results  ?Component Value Date  ? TSH 0.673 06/14/2021  ? ?Lab Results  ?Component Value Date  ? CHOL 132 06/14/2021  ? HDL 64 06/14/2021  ? LDLCALC 54 06/14/2021  ? TRIG 68 06/14/2021  ? CHOLHDL 2.7 11/09/2020  ? ?Lab Results  ?Component Value Date  ? VD25OH 73.4 04/04/2021  ? VD25OH 105.0 (H) 11/09/2020  ? ?Lab Results  ?Component Value Date  ? WBC 5.0 06/14/2021  ? HGB 13.1 06/14/2021  ? HCT 38.4 06/14/2021  ? MCV 90 06/14/2021  ? PLT 281 06/14/2021  ? ?Obesity  Behavioral Intervention:  ? ?Approximately 15 minutes were spent on the discussion below. ? ?ASK: ?We discussed the diagnosis of obesity with Teresa Newton today and Teresa Newton agreed to give [place] permission to discuss obesity behavioral modification therapy today. ? ?ASSESS: ?Teresa Newton has the diagnosis of obesity and her BMI today is 36.4. Teresa Newton is in the action stage of change.  ? ?ADVISE: ?Teresa Newton was educated on the multiple health risks of obesity as well as the benefit of weight loss to improve her health. She was advised of the need for long term treatment and the importance of lifestyle modifications to improve her current health and to decrease her risk of future health problems. ? ?AGREE: ?Multiple dietary modification options and treatment options were discussed and Teresa Newton agreed to follow the recommendations documented in the above note. ? ?ARRANGE: ?Teresa Newton was educated on the importance of frequent visits to treat obesity as outlined per CMS and USPSTF guidelines and agreed to schedule her next follow up appointment today. ? ?Attestation Statements:  ? ?Reviewed by clinician on day of visit: allergies, medications, problem list, medical history, surgical history, family history, social history, and previous encounter notes. ? ?I, Teresa Newton, CMA, am acting as transcriptionist for [employer], DO. ? ?I have reviewed the above documentation for accuracy and completeness, and I agree with the above. [name], D.O. ? ?The 21st Century Cures Act was signed into law in 2016 which includes the topic of electronic health records.  This provides immediate access to information in MyChart.  This includes consultation notes, operative notes, office notes, lab results and pathology reports.  If you have any questions about what you read please let us know at your next visit so we can discuss your concerns and take corrective action if need be.  We are right here with you. ? ?

## 2021-08-21 ENCOUNTER — Encounter (INDEPENDENT_AMBULATORY_CARE_PROVIDER_SITE_OTHER): Payer: Self-pay | Admitting: Family Medicine

## 2021-08-21 ENCOUNTER — Ambulatory Visit (INDEPENDENT_AMBULATORY_CARE_PROVIDER_SITE_OTHER): Payer: Medicare PPO | Admitting: Family Medicine

## 2021-08-21 VITALS — BP 131/74 | HR 67 | Temp 97.4°F | Ht 64.0 in | Wt 212.0 lb

## 2021-08-21 DIAGNOSIS — Z6836 Body mass index (BMI) 36.0-36.9, adult: Secondary | ICD-10-CM

## 2021-08-21 DIAGNOSIS — E1169 Type 2 diabetes mellitus with other specified complication: Secondary | ICD-10-CM

## 2021-08-21 DIAGNOSIS — E669 Obesity, unspecified: Secondary | ICD-10-CM | POA: Diagnosis not present

## 2021-08-21 DIAGNOSIS — Z6835 Body mass index (BMI) 35.0-35.9, adult: Secondary | ICD-10-CM

## 2021-08-21 DIAGNOSIS — Z7984 Long term (current) use of oral hypoglycemic drugs: Secondary | ICD-10-CM | POA: Diagnosis not present

## 2021-08-21 DIAGNOSIS — E66812 Obesity, class 2: Secondary | ICD-10-CM

## 2021-08-21 MED ORDER — RYBELSUS 7 MG PO TABS: 7.0000 mg | ORAL_TABLET | Freq: Every day | ORAL | 0 refills | Status: DC

## 2021-08-31 NOTE — Progress Notes (Signed)
? ? ? ?Chief Complaint:  ? ?OBESITY ?Antoinette is here to discuss her progress with her obesity treatment plan along with follow-up of her obesity related diagnoses. Eavan is on the Category 1 Plan with breakfast options and states she is following her eating plan approximately 40% of the time. Lyndsi states she is walking for 30 minutes 3 times per week. ? ?Today's visit was #: 4 ?Starting weight: 209 lbs ?Starting date: 06/14/2021 ?Today's weight: 212 lbs ?Today's date: 08/21/2021 ?Total lbs lost to date: 0 ?Total lbs lost since last in-office visit: 0 ? ?Interim History: Yesterday for dinner, Leilanee had 1 protein shake, lunch -plant burger and fries at [employer] on a roll. Breakfast-2 slices of cheese on 1 piece of Terrill Mohr and a cup of [employer] skim milk. Mid-morning smoothie with pineapple, berries, and mixed greens.  ? ?Subjective:  ? ?1. Type 2 diabetes mellitus with other specified complication, without long-term current use of insulin (HCC) ?Shiesha denies C/I for GLP-1's and she is tolerating 3 mg well with no GI side effects. ? ?Assessment/Plan:  ?No orders of the defined types were placed in this encounter. ? ? ?Medications Discontinued During This Encounter  ?Medication Reason  ? Semaglutide (RYBELSUS) 3 MG TABS Dose change  ?  ? ?Meds ordered this encounter  ?Medications  ? DISCONTD: Semaglutide (RYBELSUS) 7 MG TABS  ?  Sig: Take 7 mg by mouth daily.  ?  Dispense:  30 tablet  ?  Refill:  0  ?  ? ?1. Type 2 diabetes mellitus with other specified complication, without long-term current use of insulin (HCC) ?Eliette agreed to increase Rybelsus form 3 mg to 7 mg to help aid with staying on the meal plan. Chronic diagnosis management per PCP. I reminded the patient of the importance of routine follow ups with her PCP is needed. She will continue to follow her PNP and lose weight. ? ?- Semaglutide (RYBELSUS) 7 MG TABS; Take 7 mg by mouth daily.  Dispense: 30 tablet; Refill: 0 ? ?2. Obesity with current BMI of  36.5 ?Glorious is currently in the action stage of change. As such, her goal is to continue with weight loss efforts. She has agreed to change to keeping a food journal and adhering to recommended goals of 1000-1100 calories and 80+ grams of protein.  ? ?Exercise goals: As is. ? ?Behavioral modification strategies: planning for success. ? ?Tahirah has agreed to follow-up with our clinic in 2 to 3 weeks. She was informed of the importance of frequent follow-up visits to maximize her success with intensive lifestyle modifications for her multiple health conditions.  ? ?Objective:  ? ?Blood pressure 131/74, pulse 67, temperature (!) 97.4 ?F (36.3 ?C), height 5\' 4"  (1.626 m), weight 212 lb (96.2 kg), SpO2 98 %. ?Body mass index is 36.39 kg/m?. ? ?General: Cooperative, alert, well developed, in no acute distress. ?HEENT: Conjunctivae and lids unremarkable. ?Cardiovascular: Regular rhythm.  ?Lungs: Normal work of breathing. ?Neurologic: No focal deficits.  ? ?Lab Results  ?Component Value Date  ? CREATININE 0.79 04/04/2021  ? BUN 20 04/04/2021  ? NA 139 04/04/2021  ? K 4.3 04/04/2021  ? CL 100 04/04/2021  ? CO2 28 04/04/2021  ? ?Lab Results  ?Component Value Date  ? ALT 29 11/09/2020  ? AST 38 11/09/2020  ? ALKPHOS 88 11/09/2020  ? BILITOT 0.6 11/09/2020  ? ?Lab Results  ?Component Value Date  ? HGBA1C 6.0 (H) 07/06/2021  ? HGBA1C 5.8 (H) 04/04/2021  ? HGBA1C  5.8 (H) 11/09/2020  ? HGBA1C 5.7 (H) 11/17/2019  ? HGBA1C 5.8 (H) 05/04/2019  ? ?Lab Results  ?Component Value Date  ? INSULIN 6.2 06/14/2021  ? ?Lab Results  ?Component Value Date  ? TSH 0.673 06/14/2021  ? ?Lab Results  ?Component Value Date  ? CHOL 132 06/14/2021  ? HDL 64 06/14/2021  ? LDLCALC 54 06/14/2021  ? TRIG 68 06/14/2021  ? CHOLHDL 2.7 11/09/2020  ? ?Lab Results  ?Component Value Date  ? VD25OH 73.4 04/04/2021  ? VD25OH 105.0 (H) 11/09/2020  ? ?Lab Results  ?Component Value Date  ? WBC 5.0 06/14/2021  ? HGB 13.1 06/14/2021  ? HCT 38.4 06/14/2021  ? MCV 90  06/14/2021  ? PLT 281 06/14/2021  ? ?No results found for: IRON, TIBC, FERRITIN ? ?Obesity Behavioral Intervention:  ? ?Approximately 15 minutes were spent on the discussion below. ? ?ASK: ?We discussed the diagnosis of obesity with Darral Dash today and Breta agreed to give [place] permission to discuss obesity behavioral modification therapy today. ? ?ASSESS: ?Khaleesi has the diagnosis of obesity and her BMI today is 36.5. Keighley is in the action stage of change.  ? ?ADVISE: ?Jenilee was educated on the multiple health risks of obesity as well as the benefit of weight loss to improve her health. She was advised of the need for long term treatment and the importance of lifestyle modifications to improve her current health and to decrease her risk of future health problems. ? ?AGREE: ?Multiple dietary modification options and treatment options were discussed and Thaily agreed to follow the recommendations documented in the above note. ? ?ARRANGE: ?Esma was educated on the importance of frequent visits to treat obesity as outlined per CMS and USPSTF guidelines and agreed to schedule her next follow up appointment today. ? ?Attestation Statements:  ? ?Reviewed by clinician on day of visit: allergies, medications, problem list, medical history, surgical history, family history, social history, and previous encounter notes. ? ? ?I, Burt Knack, am acting as transcriptionist for [employer], DO. ? ?I have reviewed the above documentation for accuracy and completeness, and I agree with the above. [name], D.O. ? ?The 21st Century Cures Act was signed into law in 2016 which includes the topic of electronic health records.  This provides immediate access to information in MyChart.  This includes consultation notes, operative notes, office notes, lab results and pathology reports.  If you have any questions about what you read please let us know at your next visit so we can discuss your concerns and take corrective  action if need be.  We are right here with you. ? ? ?

## 2021-09-06 NOTE — Progress Notes (Signed)
?TeleHealth Visit:  ?This visit was completed with telemedicine (audio/video) technology. ?Janeane has verbally consented to this TeleHealth visit. The patient is located at home, the provider is located at home. The participants in this visit include the listed provider and patient. The visit was conducted today via MyChart video. ? ?OBESITY ?Calle is here to discuss her progress with her obesity treatment plan along with follow-up of her obesity related diagnoses.  ? ?Today's visit was # 5 ?Starting weight: 209 lbs ?Starting date: 06/14/2021 ?Weight at last in office visit: 212 lbs on 08/21/21 ?Total weight loss: 0 lbs at last in office visit on 08/21/21. ?Today's reported weight: 213 lbs  ? ?Nutrition Plan: keeping a food journal and adhering to recommended goals of 1000-1100 calories and 80 protein.  ?Hunger is well controlled. Cravings are moderately controlled. Occasionally craves salty snacks.  ?Current exercise: walking  days per week for 30 -60 minutes.  ? ?Interim History: Raeley is doing a good job with protein intake- she gets as much is 120 g.  She tracks with pencil and paper.  However she does have 2 Premier protein shakes daily.  Food recall for yesterday: ?Breakfast-toast, banana, protein shake ?Lunch-she ate out at [facility], had a quesadilla and some fried shrimp ?Dinner-bananas, protein shake ?Water intake is good at around 80 ounces per day.  Denies intake of sugar sweetened beverages.   ?She says she eats out every day for lunch but she gets to choose the restaurant.  She has a hard time tracking her lunch meal when she eats out. ?Of note, she is status post Roux-en-Y in 2014.  High weight was 260 pounds.  Nadir 182 pounds.  She kept off the weight for 3 years. ?She is moving to [place] in July or August for a change of scenery.  She reports she has too many memories where she lives now.  Her husband passed from COVID in 2019. ? ?Assessment/Plan:  ?1. Type II Diabetes ?HgbA1c is not at goal.   ?CBGs: Not checking ?Any episodes of hypoglycemia? no ?Medication(s): Rybelsus 7 mg daily. Helps with cravings.  ?On Crestor 5 mg on Crestor 5 mg. ? ?Lab Results  ?Component Value Date  ? HGBA1C 6.0 (H) 07/06/2021  ? HGBA1C 5.8 (H) 04/04/2021  ? HGBA1C 5.8 (H) 11/09/2020  ? ?Lab Results  ?Component Value Date  ? MICROALBUR 10 11/21/2020  ? LDLCALC 54 06/14/2021  ? CREATININE 0.79 04/04/2021  ? ? ?Plan: ?Refill Rybelsus 7 mg daily.  Patient request 90-day supply. ? ?2. Hypertension associated with type 2 diabetes ?Hypertension well controlled.  Home BPs run 120s over 60s. ?Medication(s): HCTZ 12.5 mg daily, lisinopril 5 mg daily ?Denies chest pain and SOB. ? ?BP Readings from Last 3 Encounters:  ?08/21/21 131/74  ?07/27/21 136/77  ?07/06/21 122/70  ? ?Lab Results  ?Component Value Date  ? CREATININE 0.79 04/04/2021  ? CREATININE 0.92 11/09/2020  ? CREATININE 0.83 11/17/2019  ? ? ?Plan: ?Current treatment plan is effective, no change in therapy.. ? ?3. Obesity: Current BMI 36.37 ?Beuna is currently in the action stage of change. As such, her goal is to continue with weight loss efforts.  ?She has agreed to keeping a food journal and adhering to recommended goals of [phone] calories and 80 gms protein.  ? ?Exercise goals: Continue current walking regimen. ?Limit protein shakes to 1/day plus 2 meals. ?Encouraged her to try using the L[employer] It app to track her food. ?Dining out handout sent via MyChart. ? ?Behavioral modification  strategies: decreasing eating out and meal planning and cooking strategies. ? ?Jhordan has agreed to follow-up with our clinic in 2 weeks.  ? ?No orders of the defined types were placed in this encounter. ? ? ?Medications Discontinued During This Encounter  ?Medication Reason  ? Semaglutide (RYBELSUS) 7 MG TABS Reorder  ?  ? ?Meds ordered this encounter  ?Medications  ? Semaglutide (RYBELSUS) 7 MG TABS  ?  Sig: Take 7 mg by mouth daily.  ?  Dispense:  90 tablet  ?  Refill:  0  ?  Order Specific  Question:   Supervising Provider  ?  Answer:   Quillian Quince D [[id number]]  ?   ? ?Objective:  ? ?VITALS: Per patient if applicable, see vitals. ?GENERAL: Alert and in no acute distress. ?CARDIOPULMONARY: No increased WOB. Speaking in clear sentences.  ?PSYCH: Pleasant and cooperative. Speech normal rate and rhythm. Affect is appropriate. Insight and judgement are appropriate. Attention is focused, linear, and appropriate.  ?NEURO: Oriented as arrived to appointment on time with no prompting.  ? ?Lab Results  ?Component Value Date  ? CREATININE 0.79 04/04/2021  ? BUN 20 04/04/2021  ? NA 139 04/04/2021  ? K 4.3 04/04/2021  ? CL 100 04/04/2021  ? CO2 28 04/04/2021  ? ?Lab Results  ?Component Value Date  ? ALT 29 11/09/2020  ? AST 38 11/09/2020  ? ALKPHOS 88 11/09/2020  ? BILITOT 0.6 11/09/2020  ? ?Lab Results  ?Component Value Date  ? HGBA1C 6.0 (H) 07/06/2021  ? HGBA1C 5.8 (H) 04/04/2021  ? HGBA1C 5.8 (H) 11/09/2020  ? HGBA1C 5.7 (H) 11/17/2019  ? HGBA1C 5.8 (H) 05/04/2019  ? ?Lab Results  ?Component Value Date  ? INSULIN 6.2 06/14/2021  ? ?Lab Results  ?Component Value Date  ? TSH 0.673 06/14/2021  ? ?Lab Results  ?Component Value Date  ? CHOL 132 06/14/2021  ? HDL 64 06/14/2021  ? LDLCALC 54 06/14/2021  ? TRIG 68 06/14/2021  ? CHOLHDL 2.7 11/09/2020  ? ?Lab Results  ?Component Value Date  ? WBC 5.0 06/14/2021  ? HGB 13.1 06/14/2021  ? HCT 38.4 06/14/2021  ? MCV 90 06/14/2021  ? PLT 281 06/14/2021  ? ?No results found for: IRON, TIBC, FERRITIN ?Lab Results  ?Component Value Date  ? VD25OH 73.4 04/04/2021  ? VD25OH 105.0 (H) 11/09/2020  ? ? ?Attestation Statements:  ? ?Reviewed by clinician on day of visit: allergies, medications, problem list, medical history, surgical history, family history, social history, and previous encounter notes. ? ? ?

## 2021-09-11 ENCOUNTER — Encounter (INDEPENDENT_AMBULATORY_CARE_PROVIDER_SITE_OTHER): Payer: Self-pay | Admitting: Family Medicine

## 2021-09-11 ENCOUNTER — Telehealth (INDEPENDENT_AMBULATORY_CARE_PROVIDER_SITE_OTHER): Payer: Medicare PPO | Admitting: Family Medicine

## 2021-09-11 DIAGNOSIS — E1169 Type 2 diabetes mellitus with other specified complication: Secondary | ICD-10-CM

## 2021-09-11 DIAGNOSIS — Z7984 Long term (current) use of oral hypoglycemic drugs: Secondary | ICD-10-CM

## 2021-09-11 DIAGNOSIS — E669 Obesity, unspecified: Secondary | ICD-10-CM | POA: Diagnosis not present

## 2021-09-11 DIAGNOSIS — I152 Hypertension secondary to endocrine disorders: Secondary | ICD-10-CM | POA: Diagnosis not present

## 2021-09-11 DIAGNOSIS — E1159 Type 2 diabetes mellitus with other circulatory complications: Secondary | ICD-10-CM

## 2021-09-11 DIAGNOSIS — Z6835 Body mass index (BMI) 35.0-35.9, adult: Secondary | ICD-10-CM

## 2021-09-11 MED ORDER — RYBELSUS 7 MG PO TABS: 7.0000 mg | ORAL_TABLET | Freq: Every day | ORAL | 0 refills | Status: DC

## 2021-09-25 ENCOUNTER — Ambulatory Visit (INDEPENDENT_AMBULATORY_CARE_PROVIDER_SITE_OTHER): Payer: Medicare PPO | Admitting: Family Medicine

## 2021-10-10 DIAGNOSIS — Z79899 Other long term (current) drug therapy: Secondary | ICD-10-CM | POA: Diagnosis not present

## 2021-10-10 DIAGNOSIS — L400 Psoriasis vulgaris: Secondary | ICD-10-CM | POA: Diagnosis not present

## 2021-11-15 ENCOUNTER — Ambulatory Visit (INDEPENDENT_AMBULATORY_CARE_PROVIDER_SITE_OTHER): Payer: Medicare PPO

## 2021-11-15 VITALS — Ht 63.0 in | Wt 215.0 lb

## 2021-11-15 DIAGNOSIS — Z0000 Encounter for general adult medical examination without abnormal findings: Secondary | ICD-10-CM | POA: Diagnosis not present

## 2021-11-15 NOTE — Progress Notes (Signed)
I connected with [name] today by telephone and verified that I am speaking with the correct person using two identifiers. Location patient: home Location provider: work Persons participating in the virtual visit: Keaton, Stirewalt LPN.   I discussed the limitations, risks, security and privacy concerns of performing an evaluation and management service by telephone and the availability of in person appointments. I also discussed with the patient that there may be a patient responsible charge related to this service. The patient expressed understanding and verbally consented to this telephonic visit.    Interactive audio and video telecommunications were attempted between this provider and patient, however failed, due to patient having technical difficulties OR patient did not have access to video capability.  We continued and completed visit with audio only.     Vital signs may be patient reported or missing.  Subjective:   Teresa Newton is a 66 y.o. female who presents for an Initial Medicare Annual Wellness Visit.  Review of Systems     Cardiac Risk Factors include: advanced age (>61men, >73 women);diabetes mellitus;hypertension;obesity (BMI >30kg/m2)     Objective:    Today's Vitals   11/15/21 1214  Weight: 215 lb (97.5 kg)  Height: 5\' 3"  (1.6 m)   Body mass index is 38.09 kg/m.     11/15/2021   12:19 PM 01/30/2018    1:30 PM 01/27/2018   12:01 PM 08/23/2015    2:16 PM  Advanced Directives  Does Patient Have a Medical Advance Directive? No No No No  Would patient like information on creating a medical advance directive?  No - Patient declined No - Patient declined No - patient declined information    Current Medications (verified) Outpatient Encounter Medications as of 11/15/2021  Medication Sig   aspirin 81 MG tablet Take 81 mg by mouth daily.   CALCIUM PO Take 650 mg by mouth daily.   Cholecalciferol (D3 5000) 125 MCG (5000 UT) capsule 5,000 IU  every other day   COSENTYX 150 MG/ML SOSY Inject into the skin every 30 (thirty) days.   Cyanocobalamin (B-12) 500 MCG TABS 400- 500 mcg daily   ELDERBERRY PO Take 50 mg by mouth daily.   EPINEPHRINE 0.3 mg/0.3 mL IJ SOAJ injection INJECT AS DIRECTED FOR ANAPHYLAXIS SHOCK   Ginger 500 MG CAPS Take 2 capsules by mouth daily.   hydrochlorothiazide (MICROZIDE) 12.5 MG capsule TAKE 1 CAPSULE BY MOUTH EVERY DAY   lisinopril (ZESTRIL) 5 MG tablet TAKE 1 TABLET BY MOUTH EVERY DAY   Magnesium 250 MG TABS Take 1 tablet by mouth daily.   POTASSIUM PO Take 99 mg by mouth daily.   Prenatal Vit-Fe Fumarate-FA (PRENATAL PO) Take 2 tablets by mouth daily.   rosuvastatin (CRESTOR) 5 MG tablet TAKE 1 TABLET (5 MG TOTAL) BY MOUTH DAILY.   Semaglutide (RYBELSUS) 7 MG TABS Take 7 mg by mouth daily.   Turmeric 500 MG CAPS Take 2 tablets by mouth daily.   No facility-administered encounter medications on file as of 11/15/2021.    Allergies (verified) Fish allergy, Milk-related compounds, and Penicillins   History: Past Medical History:  Diagnosis Date   Back pain    Bilateral edema of lower extremity    Bimalleolar ankle fracture    right   Constipation    Diabetes (HCC)    Food allergy    GERD (gastroesophageal reflux disease)    Hyperlipidemia    Hypertension    Joint pain    Lactose intolerance  LTBI (latent tuberculosis infection)    Other fatigue    Plaque psoriasis    Pre-diabetes    Shortness of breath on exertion    Sleep apnea    gastric bypass, lost 70lbs and no longer needs CPAP   Stomach ulcer    Past Surgical History:  Procedure Laterality Date   COLONOSCOPY     FOOT SURGERY Right    GASTRIC BYPASS     ORIF ANKLE FRACTURE Right 01/30/2018   Procedure: OPEN REDUCTION INTERNAL FIXATION (ORIF) right ankle trimalleolar fracture;  Surgeon: Toni ArthursHewitt, John, MD;  Location: [facility];  Service: Orthopedics;  Laterality: Right;   TUBAL LIGATION     Family History   Problem Relation Age of Onset   Tuberculosis Mother    Heart Problems Mother    Dementia Mother    Stroke Mother    Alcoholism Father    Ovarian cancer Sister    Psoriasis Brother    Breast cancer Paternal Aunt [phone]   Alcoholism Other    Social History   Socioeconomic History   Marital status: Widowed    Spouse name: Not on file   Number of children: 4   Years of education: Not on file   Highest education level: Not on file  Occupational History   Occupation: MidwifeBus Driver    Comment: Retired, 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  Tobacco Use   Smoking status: Former    Packs/day: 1.00    Years: 3.00    Total pack years: 3.00    Types: Cigarettes    Start date: 05/07/1972    Quit date: 05/06/1976    Years since quitting: 45.5   Smokeless tobacco: Never  Vaping Use   Vaping Use: Never used  Substance and Sexual Activity   Alcohol use: No    Alcohol/week: 0.0 standard drinks of alcohol   Drug use: No   Sexual activity: Not Currently    Partners: Male  Other Topics Concern   Not on file  Social History Narrative   Not on file   Social Determinants of Health   Financial Resource Strain: Low Risk  (11/15/2021)   Overall Financial Resource Strain (CARDIA)    Difficulty of Paying Living Expenses: Not hard at all  Food Insecurity: No Food Insecurity (11/15/2021)   Hunger Vital Sign    Worried About Running Out of Food in the Last Year: Never true    Ran Out of Food in the Last Year: Never true  Transportation Needs: No Transportation Needs (11/15/2021)   PRAPARE - Administrator, Civil ServiceTransportation    Lack of Transportation (Medical): No    Lack of Transportation (Non-Medical): No  Physical Activity: Insufficiently Active (11/15/2021)   Exercise Vital Sign    Days of Exercise per Week: 3 days    Minutes of Exercise per Session: 30 min  Stress: No Stress Concern Present (11/15/2021)   [employer] - Occupational Stress Questionnaire    Feeling of Stress  : Not at all  Social Connections: Not on file    Tobacco Counseling Counseling given: Not Answered   Clinical Intake:  Pre-visit preparation completed: Yes  Pain : No/denies pain     Nutritional Status: BMI > 30  Obese Nutritional Risks: None Diabetes: Yes  How often do you need to have someone help you when you read instructions, pamphlets, or other written materials from your doctor or pharmacy?: 1 - Never What is the last grade level you completed  in school?: 97yr college  Diabetic? Yes Nutrition Risk Assessment:  Has the patient had any N/V/D within the last 2 months?  No  Does the patient have any non-healing wounds?  No  Has the patient had any unintentional weight loss or weight gain?  No   Diabetes:  Is the patient diabetic?  Yes  If diabetic, was a CBG obtained today?  No  Did the patient bring in their glucometer from home?  No  How often do you monitor your CBG's? Does not.   Financial Strains and Diabetes Management:  Are you having any financial strains with the device, your supplies or your medication? No .  Does the patient want to be seen by Chronic Care Management for management of their diabetes?  No  Would the patient like to be referred to a Nutritionist or for Diabetic Management?  No   Diabetic Exams:  Diabetic Eye Exam: Overdue for diabetic eye exam. Pt has been advised about the importance in completing this exam. Patient advised to call and schedule an eye exam. Diabetic Foot Exam: Completed 11/21/2020   Interpreter Needed?: No  Information entered by :: NAllen LPN   Activities of Daily Living    11/15/2021   12:20 PM 01/25/2021   11:32 AM  In your present state of health, do you have any difficulty performing the following activities:  Hearing? 0 0  Vision? 0 0  Difficulty concentrating or making decisions? 0 0  Walking or climbing stairs? 0 0  Dressing or bathing? 0 0  Doing errands, shopping? 0 0  Preparing Food and eating ? N    Using the Toilet? N   In the past six months, have you accidently leaked urine? N   Do you have problems with loss of bowel control? N   Managing your Medications? N   Managing your Finances? N   Housekeeping or managing your Housekeeping? N     Patient Care Team: Dorothyann Peng, MD as PCP - General (Internal Medicine)  Indicate any recent Medical Services you may have received from other than [facility] providers in the past year (date may be approximate).     Assessment:   This is a routine wellness examination for Teresa Newton.  Hearing/Vision screen Vision Screening - Comments:: Regular eye exams, [facility]  Dietary issues and exercise activities discussed: Current Exercise Habits: Home exercise routine, Type of exercise: walking, Time (Minutes): 30, Frequency (Times/Week): 3, Weekly Exercise (Minutes/Week): 90   Goals Addressed             This Visit's Progress    Patient Stated       11/15/2021, wants to lose 30 pounds       Depression Screen    11/15/2021   12:20 PM 07/06/2021   10:17 AM 06/14/2021   10:06 AM 11/21/2020   11:08 AM 11/16/2019    5:35 PM 11/10/2018   11:29 AM 06/11/2018    9:34 AM  PHQ 2/9 Scores  PHQ - 2 Score 0 1 5 4 3  0 0  PHQ- 9 Score   15 12 12       Fall Risk    11/15/2021   12:20 PM 01/25/2021   11:32 AM 11/10/2018   11:29 AM 03/10/2018   11:43 AM 10/18/2015    1:51 PM  Fall Risk   Falls in the past year? 0 0 0 1 No  Number falls in past yr: 0 0  0   Injury with Fall? 0 0  1  Risk for fall due to : Medication side effect      Follow up Falls evaluation completed;Education provided;Falls prevention discussed        FALL RISK PREVENTION PERTAINING TO THE HOME:  Any stairs in or around the home? Yes  If so, are there any without handrails? No  Home free of loose throw rugs in walkways, pet beds, electrical cords, etc? Yes  Adequate lighting in your home to reduce risk of falls? Yes   ASSISTIVE DEVICES UTILIZED TO PREVENT FALLS:  Life alert? No   Use of a cane, walker or w/c? No  Grab bars in the bathroom? No  Shower chair or bench in shower? No  Elevated toilet seat or a handicapped toilet? No   TIMED UP AND GO:  Was the test performed? No .      Cognitive Function:        11/15/2021   12:21 PM  6CIT Screen  What Year? 0 points  What month? 0 points  What time? 0 points  Count back from 20 0 points  Months in reverse 0 points  Repeat phrase 4 points  Total Score 4 points    Immunizations Immunization History  Administered Date(s) Administered   PFIZER(Purple Top)SARS-COV-2 Vaccination 07/03/2019, 07/25/2019   Pneumococcal Polysaccharide-23 11/21/2020   Tdap 05/04/2019    TDAP status: Up to date  Flu Vaccine status: Declined, Education has been provided regarding the importance of this vaccine but patient still declined. Advised may receive this vaccine at local pharmacy or Health Dept. Aware to provide a copy of the vaccination record if obtained from local pharmacy or Health Dept. Verbalized acceptance and understanding.  Pneumococcal vaccine status: Up to date  Covid-19 vaccine status: Completed vaccines  Qualifies for Shingles Vaccine? Yes   Zostavax completed No   Shingrix Completed?: No.    Education has been provided regarding the importance of this vaccine. Patient has been advised to call insurance company to determine out of pocket expense if they have not yet received this vaccine. Advised may also receive vaccine at local pharmacy or Health Dept. Verbalized acceptance and understanding.  Screening Tests Health Maintenance  Topic Date Due   Zoster Vaccines- Shingrix (1 of 2) Never done   COVID-19 Vaccine (3 - Pfizer risk series) 08/22/2019   OPHTHALMOLOGY EXAM  05/11/2021   Pneumonia Vaccine 12+ Years old (2 - PCV) 11/21/2021   FOOT EXAM  11/21/2021   INFLUENZA VACCINE  12/05/2021   HEMOGLOBIN A1C  01/06/2022   PAP SMEAR-Modifier  05/04/2022   MAMMOGRAM  01/03/2023   TETANUS/TDAP   05/03/2029   COLONOSCOPY (Pts 45-27yrs Insurance coverage will need to be confirmed)  03/10/2030   DEXA SCAN  Completed   Hepatitis C Screening  Completed   HIV Screening  Completed   HPV VACCINES  Aged Out    Health Maintenance  Health Maintenance Due  Topic Date Due   Zoster Vaccines- Shingrix (1 of 2) Never done   COVID-19 Vaccine (3 - Pfizer risk series) 08/22/2019   OPHTHALMOLOGY EXAM  05/11/2021   Pneumonia Vaccine 55+ Years old (2 - PCV) 11/21/2021    Colorectal cancer screening: Type of screening: Colonoscopy. Completed 03/10/2020. Repeat every 10 years  Mammogram status: scheduled for 01/05/2022  Bone Density status: Completed 07/12/2021.   Lung Cancer Screening: (Low Dose CT Chest recommended if Age 75-80 years, 30 pack-year currently smoking OR have quit w/in 15years.) does not qualify.   Lung Cancer Screening Referral: no  Additional Screening:  Hepatitis C Screening: does qualify; Completed 05/23/2012  Vision Screening: Recommended annual ophthalmology exams for early detection of glaucoma and other disorders of the eye. Is the patient up to date with their annual eye exam?  No  Who is the provider or what is the name of the office in which the patient attends annual eye exams? [facility] If pt is not established with a provider, would they like to be referred to a provider to establish care? No .   Dental Screening: Recommended annual dental exams for proper oral hygiene  Community Resource Referral / Chronic Care Management: CRR required this visit?  No   CCM required this visit?  No      Plan:     I have personally reviewed and noted the following in the patient's chart:   Medical and social history Use of alcohol, tobacco or illicit drugs  Current medications and supplements including opioid prescriptions. Patient is not currently taking opioid prescriptions. Functional ability and status Nutritional status Physical activity Advanced directives List  of other physicians Hospitalizations, surgeries, and ER visits in previous 12 months Vitals Screenings to include cognitive, depression, and falls Referrals and appointments  In addition, I have reviewed and discussed with patient certain preventive protocols, quality metrics, and best practice recommendations. A written personalized care plan for preventive services as well as general preventive health recommendations were provided to patient.     Barb Merino, LPN   [id number]   Nurse Notes: none  Due to this being a virtual visit, the after visit summary with patients personalized plan was offered to patient via mail or my-chart.  Patient would like to access on my-chart

## 2021-11-15 NOTE — Patient Instructions (Signed)
Teresa Newton , Thank you for taking time to come for your Medicare Wellness Visit. I appreciate your ongoing commitment to your health goals. Please review the following plan we discussed and let me know if I can assist you in the future.   Screening recommendations/referrals: Colonoscopy: completed 03/10/2020, due 03/10/2030 Mammogram: scheduled for 01/05/2022 Bone Density: completed 07/12/2021 Recommended yearly ophthalmology/optometry visit for glaucoma screening and checkup Recommended yearly dental visit for hygiene and checkup  Vaccinations: Influenza vaccine: decline Pneumococcal vaccine: completed 11/21/2020 Tdap vaccine: completed 05/04/2019, due 05/03/2029 Shingles vaccine: discussed   Covid-19: 07/25/2019, 07/03/2019  Advanced directives: Advance directive discussed with you today.   Conditions/risks identified: none  Next appointment: Follow up in one year for your annual wellness visit    Preventive Care 65 Years and Older, Female Preventive care refers to lifestyle choices and visits with your health care provider that can promote health and wellness. What does preventive care include? A yearly physical exam. This is also called an annual well check. Dental exams once or twice a year. Routine eye exams. Ask your health care provider how often you should have your eyes checked. Personal lifestyle choices, including: Daily care of your teeth and gums. Regular physical activity. Eating a healthy diet. Avoiding tobacco and drug use. Limiting alcohol use. Practicing safe sex. Taking low-dose aspirin every day. Taking vitamin and mineral supplements as recommended by your health care provider. What happens during an annual well check? The services and screenings done by your health care provider during your annual well check will depend on your age, overall health, lifestyle risk factors, and family history of disease. Counseling  Your health care provider may ask you questions  about your: Alcohol use. Tobacco use. Drug use. Emotional well-being. Home and relationship well-being. Sexual activity. Eating habits. History of falls. Memory and ability to understand (cognition). Work and work Astronomer. Reproductive health. Screening  You may have the following tests or measurements: Height, weight, and BMI. Blood pressure. Lipid and cholesterol levels. These may be checked every 5 years, or more frequently if you are over 65 years old. Skin check. Lung cancer screening. You may have this screening every year starting at age 106 if you have a 30-pack-year history of smoking and currently smoke or have quit within the past 15 years. Fecal occult blood test (FOBT) of the stool. You may have this test every year starting at age 31. Flexible sigmoidoscopy or colonoscopy. You may have a sigmoidoscopy every 5 years or a colonoscopy every 10 years starting at age 23. Hepatitis C blood test. Hepatitis B blood test. Sexually transmitted disease (STD) testing. Diabetes screening. This is done by checking your blood sugar (glucose) after you have not eaten for a while (fasting). You may have this done every 1-3 years. Bone density scan. This is done to screen for osteoporosis. You may have this done starting at age 10. Mammogram. This may be done every 1-2 years. Talk to your health care provider about how often you should have regular mammograms. Talk with your health care provider about your test results, treatment options, and if necessary, the need for more tests. Vaccines  Your health care provider may recommend certain vaccines, such as: Influenza vaccine. This is recommended every year. Tetanus, diphtheria, and acellular pertussis (Tdap, Td) vaccine. You may need a Td booster every 10 years. Zoster vaccine. You may need this after age 39. Pneumococcal 13-valent conjugate (PCV13) vaccine. One dose is recommended after age 19. Pneumococcal polysaccharide (PPSV23)  vaccine.  One dose is recommended after age 57. Talk to your health care provider about which screenings and vaccines you need and how often you need them. This information is not intended to replace advice given to you by your health care provider. Make sure you discuss any questions you have with your health care provider. Document Released: 05/20/2015 Document Revised: 01/11/2016 Document Reviewed: 02/22/2015 Elsevier Interactive Patient Education  2017 [employer] Prevention in the Home Falls can cause injuries. They can happen to people of all ages. There are many things you can do to make your home safe and to help prevent falls. What can I do on the outside of my home? Regularly fix the edges of walkways and driveways and fix any cracks. Remove anything that might make you trip as you walk through a door, such as a raised step or threshold. Trim any bushes or trees on the path to your home. Use bright outdoor lighting. Clear any walking paths of anything that might make someone trip, such as rocks or tools. Regularly check to see if handrails are loose or broken. Make sure that both sides of any steps have handrails. Any raised decks and porches should have guardrails on the edges. Have any leaves, snow, or ice cleared regularly. Use sand or salt on walking paths during winter. Clean up any spills in your garage right away. This includes oil or grease spills. What can I do in the bathroom? Use night lights. Install grab bars by the toilet and in the tub and shower. Do not use towel bars as grab bars. Use non-skid mats or decals in the tub or shower. If you need to sit down in the shower, use a plastic, non-slip stool. Keep the floor dry. Clean up any water that spills on the floor as soon as it happens. Remove soap buildup in the tub or shower regularly. Attach bath mats securely with double-sided non-slip rug tape. Do not have throw rugs and other things on the floor that  can make you trip. What can I do in the bedroom? Use night lights. Make sure that you have a light by your bed that is easy to reach. Do not use any sheets or blankets that are too big for your bed. They should not hang down onto the floor. Have a firm chair that has side arms. You can use this for support while you get dressed. Do not have throw rugs and other things on the floor that can make you trip. What can I do in the kitchen? Clean up any spills right away. Avoid walking on wet floors. Keep items that you use a lot in easy-to-reach places. If you need to reach something above you, use a strong step stool that has a grab bar. Keep electrical cords out of the way. Do not use floor polish or wax that makes floors slippery. If you must use wax, use non-skid floor wax. Do not have throw rugs and other things on the floor that can make you trip. What can I do with my stairs? Do not leave any items on the stairs. Make sure that there are handrails on both sides of the stairs and use them. Fix handrails that are broken or loose. Make sure that handrails are as long as the stairways. Check any carpeting to make sure that it is firmly attached to the stairs. Fix any carpet that is loose or worn. Avoid having throw rugs at the top or bottom of the  stairs. If you do have throw rugs, attach them to the floor with carpet tape. Make sure that you have a light switch at the top of the stairs and the bottom of the stairs. If you do not have them, ask someone to add them for you. What else can I do to help prevent falls? Wear shoes that: Do not have high heels. Have rubber bottoms. Are comfortable and fit you well. Are closed at the toe. Do not wear sandals. If you use a stepladder: Make sure that it is fully opened. Do not climb a closed stepladder. Make sure that both sides of the stepladder are locked into place. Ask someone to hold it for you, if possible. Clearly mark and make sure that you  can see: Any grab bars or handrails. First and last steps. Where the edge of each step is. Use tools that help you move around (mobility aids) if they are needed. These include: Canes. Walkers. Scooters. Crutches. Turn on the lights when you go into a dark area. Replace any light bulbs as soon as they burn out. Set up your furniture so you have a clear path. Avoid moving your furniture around. If any of your floors are uneven, fix them. If there are any pets around you, be aware of where they are. Review your medicines with your doctor. Some medicines can make you feel dizzy. This can increase your chance of falling. Ask your doctor what other things that you can do to help prevent falls. This information is not intended to replace advice given to you by your health care provider. Make sure you discuss any questions you have with your health care provider. Document Released: 02/17/2009 Document Revised: 09/29/2015 Document Reviewed: 05/28/2014 Elsevier Interactive Patient Education  2017 [employer].

## 2021-11-28 ENCOUNTER — Ambulatory Visit (INDEPENDENT_AMBULATORY_CARE_PROVIDER_SITE_OTHER): Payer: Medicare PPO | Admitting: Internal Medicine

## 2021-11-28 ENCOUNTER — Encounter: Payer: Self-pay | Admitting: Internal Medicine

## 2021-11-28 VITALS — BP 130/78 | HR 78 | Temp 98.2°F | Ht 63.0 in | Wt 214.8 lb

## 2021-11-28 DIAGNOSIS — E1169 Type 2 diabetes mellitus with other specified complication: Secondary | ICD-10-CM | POA: Diagnosis not present

## 2021-11-28 DIAGNOSIS — Z0000 Encounter for general adult medical examination without abnormal findings: Secondary | ICD-10-CM

## 2021-11-28 DIAGNOSIS — E6609 Other obesity due to excess calories: Secondary | ICD-10-CM

## 2021-11-28 DIAGNOSIS — M25552 Pain in left hip: Secondary | ICD-10-CM | POA: Diagnosis not present

## 2021-11-28 DIAGNOSIS — E1159 Type 2 diabetes mellitus with other circulatory complications: Secondary | ICD-10-CM

## 2021-11-28 DIAGNOSIS — E559 Vitamin D deficiency, unspecified: Secondary | ICD-10-CM | POA: Diagnosis not present

## 2021-11-28 DIAGNOSIS — E669 Obesity, unspecified: Secondary | ICD-10-CM | POA: Diagnosis not present

## 2021-11-28 DIAGNOSIS — I152 Hypertension secondary to endocrine disorders: Secondary | ICD-10-CM | POA: Diagnosis not present

## 2021-11-28 DIAGNOSIS — Z6838 Body mass index (BMI) 38.0-38.9, adult: Secondary | ICD-10-CM

## 2021-11-28 LAB — POCT URINALYSIS DIPSTICK
Bilirubin, UA: NEGATIVE
Blood, UA: NEGATIVE
Glucose, UA: NEGATIVE
Leukocytes, UA: NEGATIVE
Nitrite, UA: NEGATIVE
Protein, UA: NEGATIVE
Spec Grav, UA: 1.025 (ref 1.010–1.025)
Urobilinogen, UA: 0.2 E.U./dL
pH, UA: 5.5 (ref 5.0–8.0)

## 2021-11-28 NOTE — Progress Notes (Signed)
Barnet Glasgow Martin,acting as a Education administrator for [name], MD.,have documented all relevant documentation on the behalf of [name], MD,as directed by  [name], MD while in the presence of [name], MD.   Subjective:     Patient ID: Teresa Newton , female    DOB: 1955-06-06 , 66 y.o.   MRN: [id number]   Chief Complaint  Patient presents with   Annual Exam   Diabetes   Hypertension    HPI  Patient presents today for HM. She sees Dr. Garwin Brothers for her Gyn exams. She reports compliance with meds. She denies headaches, chest pain and shortness of breath. She adds that she plans to move to [place], [place] in the next month or two once her house sells. She wants to be near her family.   BP Readings from Last 3 Encounters: 11/28/21 : 130/78 08/21/21 : 131/74 07/27/21 : 136/77    Diabetes She presents for her follow-up diabetic visit. She has type 2 diabetes mellitus. Her disease course has been stable. There are no hypoglycemic associated symptoms. Pertinent negatives for diabetes include no blurred vision, no chest pain, no fatigue (she c/o fatigue. states she is not sleeping well. she is not sure why. denies change in sleeping/eating habits. ), no foot paresthesias, no polydipsia and no polyphagia. There are no hypoglycemic complications. Risk factors for coronary artery disease include diabetes mellitus, dyslipidemia, hypertension, obesity, post-menopausal and sedentary lifestyle. She is following a generally healthy and diabetic diet. She never participates in exercise. Her breakfast blood glucose is taken between 7-8 am. Her breakfast blood glucose range is generally 90-110 mg/dl. An ACE inhibitor/angiotensin II receptor blocker is being taken. Eye exam is not current.  Hypertension This is a chronic problem. The current episode started more than 1 year ago. The problem has been gradually improving since onset. The problem is controlled. Pertinent negatives include no blurred  vision, chest pain, malaise/fatigue, palpitations or shortness of breath. Risk factors for coronary artery disease include post-menopausal state, obesity, sedentary lifestyle, dyslipidemia and diabetes mellitus. The current treatment provides moderate improvement.     Past Medical History:  Diagnosis Date   Back pain    Bilateral edema of lower extremity    Bimalleolar ankle fracture    right   Constipation    Diabetes (HCC)    Food allergy    GERD (gastroesophageal reflux disease)    Hyperlipidemia    Hypertension    Joint pain    Lactose intolerance    LTBI (latent tuberculosis infection)    Other fatigue    Plaque psoriasis    Pre-diabetes    Shortness of breath on exertion    Sleep apnea    gastric bypass, lost 70lbs and no longer needs CPAP   Stomach ulcer      Family History  Problem Relation Age of Onset   Tuberculosis Mother    Heart Problems Mother    Dementia Mother    Stroke Mother    Alcoholism Father    Ovarian cancer Sister    Psoriasis Brother    Breast cancer Paternal Aunt 5   Alcoholism Other      Current Outpatient Medications:    aspirin 81 MG tablet, Take 81 mg by mouth daily., Disp: , Rfl:    CALCIUM PO, Take 650 mg by mouth daily., Disp: , Rfl:    Cholecalciferol (D3 5000) 125 MCG (5000 UT) capsule, 5,000 IU every other day, Disp: , Rfl:  COSENTYX 150 MG/ML SOSY, Inject into the skin every 30 (thirty) days., Disp: , Rfl:    Cyanocobalamin (B-12) 500 MCG TABS, 400- 500 mcg daily, Disp: , Rfl:    ELDERBERRY PO, Take 50 mg by mouth daily., Disp: , Rfl:    EPINEPHRINE 0.3 mg/0.3 mL IJ SOAJ injection, INJECT AS DIRECTED FOR ANAPHYLAXIS SHOCK, Disp: 1 Device, Rfl: 2   Ginger 500 MG CAPS, Take 2 capsules by mouth daily., Disp: , Rfl:    hydrochlorothiazide (MICROZIDE) 12.5 MG capsule, TAKE 1 CAPSULE BY MOUTH EVERY DAY, Disp: 90 capsule, Rfl: 1   lisinopril (ZESTRIL) 5 MG tablet, TAKE 1 TABLET BY MOUTH EVERY DAY, Disp: 90 tablet, Rfl: 1   Magnesium  250 MG TABS, Take 1 tablet by mouth daily., Disp: , Rfl:    POTASSIUM PO, Take 99 mg by mouth daily., Disp: , Rfl:    Prenatal Vit-Fe Fumarate-FA (PRENATAL PO), Take 2 tablets by mouth daily., Disp: , Rfl:    rosuvastatin (CRESTOR) 5 MG tablet, TAKE 1 TABLET (5 MG TOTAL) BY MOUTH DAILY., Disp: 90 tablet, Rfl: 1   Semaglutide (RYBELSUS) 7 MG TABS, Take 7 mg by mouth daily., Disp: 90 tablet, Rfl: 0   Turmeric 500 MG CAPS, Take 2 tablets by mouth daily., Disp: , Rfl:    Allergies  Allergen Reactions   Fish Allergy Anaphylaxis    Cod fish    Milk-Related Compounds Anaphylaxis    Goat Milk ONLY    Penicillins Hives      The patient states she uses post menopausal status for birth control. Last LMP was No LMP recorded. Patient is postmenopausal.. Negative for Dysmenorrhea. Negative for: breast discharge, breast lump(s), breast pain and breast self exam. Associated symptoms include abnormal vaginal bleeding. Pertinent negatives include abnormal bleeding (hematology), anxiety, decreased libido, depression, difficulty falling sleep, dyspareunia, history of infertility, nocturia, sexual dysfunction, sleep disturbances, urinary incontinence, urinary urgency, vaginal discharge and vaginal itching. Diet regular.The patient states her exercise level is  intermittent.  . The patient's tobacco use is:  Social History   Tobacco Use  Smoking Status Former   Packs/day: 1.00   Years: 3.00   Total pack years: 3.00   Types: Cigarettes   Start date: 05/07/1972   Quit date: 05/06/1976   Years since quitting: 45.5  Smokeless Tobacco Never  . She has been exposed to passive smoke. The patient's alcohol use is:  Social History   Substance and Sexual Activity  Alcohol Use No   Alcohol/week: 0.0 standard drinks of alcohol   Review of Systems  Constitutional: Negative.  Negative for fatigue (she c/o fatigue. states she is not sleeping well. she is not sure why. denies change in sleeping/eating habits. ) and  malaise/fatigue.  HENT: Negative.    Eyes: Negative.  Negative for blurred vision.  Respiratory: Negative.  Negative for shortness of breath.   Cardiovascular: Negative.  Negative for chest pain and palpitations.  Gastrointestinal: Negative.   Endocrine: Negative.  Negative for polydipsia and polyphagia.  Genitourinary: Negative.   Musculoskeletal:  Positive for arthralgias.       She c/o left hip pain. She denies fall/trauma. There is pain with ambulation. She did notice some stiffness. Sx have improved since she started a regular walking program again.   Skin: Negative.   Allergic/Immunologic: Negative.   Neurological: Negative.   Hematological: Negative.   Psychiatric/Behavioral: Negative.       Today's Vitals   11/28/21 1054  BP: 130/78  Pulse: 78  Temp: 98.2  F (36.8 C)  TempSrc: Oral  Weight: 214 lb 12.8 oz (97.4 kg)  Height: 5' 3"  (1.6 m)   Body mass index is 38.05 kg/m.  Wt Readings from Last 3 Encounters:  11/28/21 214 lb 12.8 oz (97.4 kg)  11/15/21 215 lb (97.5 kg)  08/21/21 212 lb (96.2 kg)     Objective:  Physical Exam Vitals and nursing note reviewed.  Constitutional:      Appearance: Normal appearance. She is obese.  HENT:     Head: Normocephalic and atraumatic.     Right Ear: Tympanic membrane, ear canal and external ear normal.     Left Ear: Tympanic membrane, ear canal and external ear normal.     Nose: Nose normal.     Mouth/Throat:     Mouth: Mucous membranes are moist.     Pharynx: Oropharynx is clear.  Eyes:     Extraocular Movements: Extraocular movements intact.     Conjunctiva/sclera: Conjunctivae normal.     Pupils: Pupils are equal, round, and reactive to light.  Cardiovascular:     Rate and Rhythm: Normal rate and regular rhythm.     Pulses: Normal pulses.          Dorsalis pedis pulses are 2+ on the right side and 2+ on the left side.     Heart sounds: Normal heart sounds.  Pulmonary:     Effort: Pulmonary effort is normal.      Breath sounds: Normal breath sounds.  Chest:  Breasts:    Tanner Score is 5.     Right: Normal.     Left: Normal.  Abdominal:     General: Bowel sounds are normal.     Palpations: Abdomen is soft.     Comments: Obese, soft. Difficult to assess organomegaly.   Genitourinary:    Comments: deferred Musculoskeletal:        General: Normal range of motion.     Cervical back: Normal range of motion and neck supple.  Feet:     Right foot:     Protective Sensation: 5 sites tested.  5 sites sensed.     Skin integrity: Skin integrity normal.     Toenail Condition: Right toenails are normal.     Left foot:     Protective Sensation: 5 sites tested.  5 sites sensed.     Skin integrity: Skin integrity normal.     Toenail Condition: Left toenails are normal.  Skin:    General: Skin is warm and dry.  Neurological:     General: No focal deficit present.     Mental Status: She is alert and oriented to person, place, and time.  Psychiatric:        Mood and Affect: Mood normal.        Behavior: Behavior normal.      Assessment And Plan:     1. Annual physical exam Comments: A full exam was performed. Importance of monthly self breast exams was discussed with the patient. PATIENT IS ADVISED TO GET 30-45 MINUTES REGULAR EXERCISE NO LESS THAN FOUR TO FIVE DAYS PER WEEK - BOTH WEIGHTBEARING EXERCISES AND AEROBIC ARE RECOMMENDED.  PATIENT IS ADVISED TO FOLLOW A HEALTHY DIET WITH AT LEAST SIX FRUITS/VEGGIES PER DAY, DECREASE INTAKE OF RED MEAT, AND TO INCREASE FISH INTAKE TO TWO DAYS PER WEEK.  MEATS/FISH SHOULD NOT BE FRIED, BAKED OR BROILED IS PREFERABLE.  IT IS ALSO IMPORTANT TO CUT BACK ON YOUR SUGAR INTAKE. PLEASE AVOID ANYTHING WITH ADDED SUGAR,  CORN SYRUP OR OTHER SWEETENERS. IF YOU MUST USE A SWEETENER, YOU CAN TRY STEVIA. IT IS ALSO IMPORTANT TO AVOID ARTIFICIALLY SWEETENERS AND DIET BEVERAGES. LASTLY, I SUGGEST WEARING SPF 50 SUNSCREEN ON EXPOSED PARTS AND ESPECIALLY WHEN IN THE DIRECT SUNLIGHT  FOR AN EXTENDED PERIOD OF TIME.  PLEASE AVOID FAST FOOD RESTAURANTS AND INCREASE YOUR WATER INTAKE.   2. Obesity, diabetes, and hypertension syndrome (HCC) Comments: Chronic, EKG performed, NSR w/ nonspecific ST depression and nonspecific T abnormality.  Diabetic foot exam was performed. I DISCUSSED WITH THE PATIENT AT LENGTH REGARDING THE GOALS OF GLYCEMIC CONTROL AND POSSIBLE LONG-TERM COMPLICATIONS.  I  ALSO STRESSED THE IMPORTANCE OF COMPLIANCE WITH HOME GLUCOSE MONITORING, DIETARY RESTRICTIONS INCLUDING AVOIDANCE OF SUGARY DRINKS/PROCESSED FOODS,  ALONG WITH REGULAR EXERCISE.  I  ALSO STRESSED THE IMPORTANCE OF ANNUAL EYE EXAMS, SELF FOOT CARE AND COMPLIANCE WITH OFFICE VISITS.  - EKG 12-Lead - CMP14+EGFR - Hemoglobin A1c - Microalbumin / Creatinine Urine Ratio - POCT Urinalysis Dipstick (81002)  3. Left hip pain Comments: She feels her sx have improved now that she is walking more. She will need x-rays if her sx return. I will check vitamin D level as well.   4. Vitamin D deficiency Comments: I will check a vitamin D level today and supplement as needed.  - Vitamin D (25 hydroxy)  5. Class 2 obesity due to excess calories without serious comorbidity with body mass index (BMI) of 38.0 to 38.9 in adult Comments: She is encouraged to aim for at least 150 minute sof exercise per week, while striving for BMI<30 to decrease cardiac risk.     Patient was given opportunity to ask questions. Patient verbalized understanding of the plan and was able to repeat key elements of the plan. All questions were answered to their satisfaction.   I, [name], MD, have reviewed all documentation for this visit. The documentation on 11/28/21 for the exam, diagnosis, procedures, and orders are all accurate and complete.   THE PATIENT IS ENCOURAGED TO PRACTICE SOCIAL DISTANCING DUE TO THE COVID-19 PANDEMIC.

## 2021-11-28 NOTE — Patient Instructions (Signed)

## 2021-11-29 LAB — VITAMIN D 25 HYDROXY (VIT D DEFICIENCY, FRACTURES): Vit D, 25-Hydroxy: 70.7 ng/mL (ref 30.0–100.0)

## 2021-11-29 LAB — CMP14+EGFR
ALT: 26 IU/L (ref 0–32)
AST: 25 IU/L (ref 0–40)
Albumin/Globulin Ratio: 1.5 (ref 1.2–2.2)
Albumin: 4.6 g/dL (ref 3.9–4.9)
Alkaline Phosphatase: 91 IU/L (ref 44–121)
BUN/Creatinine Ratio: 29 — ABNORMAL HIGH (ref 12–28)
BUN: 24 mg/dL (ref 8–27)
Bilirubin Total: 0.9 mg/dL (ref 0.0–1.2)
CO2: 28 mmol/L (ref 20–29)
Calcium: 9.7 mg/dL (ref 8.7–10.3)
Chloride: 102 mmol/L (ref 96–106)
Creatinine, Ser: 0.83 mg/dL (ref 0.57–1.00)
Globulin, Total: 3.1 g/dL (ref 1.5–4.5)
Glucose: 95 mg/dL (ref 70–99)
Potassium: 4.1 mmol/L (ref 3.5–5.2)
Sodium: 142 mmol/L (ref 134–144)
Total Protein: 7.7 g/dL (ref 6.0–8.5)
eGFR: 78 mL/min/{1.73_m2} (ref >59)

## 2021-11-29 LAB — MICROALBUMIN / CREATININE URINE RATIO
Creatinine, Urine: 183.9 mg/dL
Microalb/Creat Ratio: 12 mg/g creat (ref 0–29)
Microalbumin, Urine: 22.7 ug/mL

## 2021-11-29 LAB — HEMOGLOBIN A1C
Est. average glucose Bld gHb Est-mCnc: 128 mg/dL
Hgb A1c MFr Bld: 6.1 % — ABNORMAL HIGH (ref 4.8–5.6)

## 2021-12-13 ENCOUNTER — Encounter (INDEPENDENT_AMBULATORY_CARE_PROVIDER_SITE_OTHER): Payer: Self-pay

## 2021-12-15 ENCOUNTER — Other Ambulatory Visit (INDEPENDENT_AMBULATORY_CARE_PROVIDER_SITE_OTHER): Payer: Self-pay | Admitting: Family Medicine

## 2021-12-15 DIAGNOSIS — E1169 Type 2 diabetes mellitus with other specified complication: Secondary | ICD-10-CM

## 2021-12-22 ENCOUNTER — Other Ambulatory Visit (INDEPENDENT_AMBULATORY_CARE_PROVIDER_SITE_OTHER): Payer: Self-pay | Admitting: Family Medicine

## 2021-12-22 DIAGNOSIS — E1169 Type 2 diabetes mellitus with other specified complication: Secondary | ICD-10-CM

## 2021-12-25 ENCOUNTER — Other Ambulatory Visit: Payer: Self-pay | Admitting: Internal Medicine

## 2021-12-25 ENCOUNTER — Other Ambulatory Visit: Payer: Self-pay

## 2021-12-25 MED ORDER — LISINOPRIL 5 MG PO TABS: 5.0000 mg | ORAL_TABLET | Freq: Every day | ORAL | 1 refills | Status: DC

## 2021-12-25 MED ORDER — ROSUVASTATIN CALCIUM 5 MG PO TABS: 5.0000 mg | ORAL_TABLET | Freq: Every day | ORAL | 1 refills | Status: DC

## 2021-12-25 MED ORDER — DEXCOM G7 RECEIVER DEVI: 1 refills | Status: DC

## 2021-12-25 MED ORDER — DEXCOM G7 SENSOR MISC: 1 refills | Status: AC

## 2022-01-05 ENCOUNTER — Inpatient Hospital Stay: Admission: RE | Admit: 2022-01-05 | Payer: Medicare PPO | Source: Ambulatory Visit

## 2022-01-12 ENCOUNTER — Other Ambulatory Visit (INDEPENDENT_AMBULATORY_CARE_PROVIDER_SITE_OTHER): Payer: Self-pay | Admitting: Family Medicine

## 2022-01-12 DIAGNOSIS — E1169 Type 2 diabetes mellitus with other specified complication: Secondary | ICD-10-CM

## 2022-01-31 ENCOUNTER — Ambulatory Visit: Payer: Medicare PPO

## 2022-02-02 ENCOUNTER — Other Ambulatory Visit: Payer: Self-pay | Admitting: Internal Medicine

## 2022-02-08 ENCOUNTER — Encounter: Payer: Self-pay | Admitting: Internal Medicine

## 2022-02-08 ENCOUNTER — Ambulatory Visit (INDEPENDENT_AMBULATORY_CARE_PROVIDER_SITE_OTHER): Payer: Medicare PPO | Admitting: Internal Medicine

## 2022-02-08 VITALS — BP 130/78 | HR 73 | Temp 98.0°F | Ht 63.0 in | Wt 221.6 lb

## 2022-02-08 DIAGNOSIS — E1159 Type 2 diabetes mellitus with other circulatory complications: Secondary | ICD-10-CM | POA: Diagnosis not present

## 2022-02-08 DIAGNOSIS — E1169 Type 2 diabetes mellitus with other specified complication: Secondary | ICD-10-CM | POA: Diagnosis not present

## 2022-02-08 DIAGNOSIS — E669 Obesity, unspecified: Secondary | ICD-10-CM | POA: Diagnosis not present

## 2022-02-08 DIAGNOSIS — I152 Hypertension secondary to endocrine disorders: Secondary | ICD-10-CM

## 2022-02-08 DIAGNOSIS — Z6839 Body mass index (BMI) 39.0-39.9, adult: Secondary | ICD-10-CM

## 2022-02-08 DIAGNOSIS — F4321 Adjustment disorder with depressed mood: Secondary | ICD-10-CM

## 2022-02-08 DIAGNOSIS — R42 Dizziness and giddiness: Secondary | ICD-10-CM

## 2022-02-08 MED ORDER — ESCITALOPRAM OXALATE 10 MG PO TABS: 10.0000 mg | ORAL_TABLET | Freq: Every day | ORAL | 2 refills | Status: DC

## 2022-02-08 NOTE — Progress Notes (Signed)
Teresa Newton,acting as a Education administrator for Teresa Greenland, MD.,have documented all relevant documentation on the behalf of Teresa Greenland, MD,as directed by  Teresa Greenland, MD while in the presence of Teresa Greenland, MD.    Subjective:     Patient ID: Teresa Newton , female    DOB: 10-12-1955 , 66 y.o.   MRN: [id number]   Chief Complaint  Patient presents with   Hypertension   Diabetes    HPI  Patient presents today for diabetes and bp f/u. She reports compliance with meds. She denies having any headaches, chest pain and shortness of breath.  Patient reports she has been having some dizziness for the past 2 days. She does not feel the room is spinning. No recent URI/cold sx. This usually occurs with positional changes.    Diabetes She presents for her follow-up diabetic visit. She has type 2 diabetes mellitus. Her disease course has been stable. There are no hypoglycemic complications. Risk factors for coronary artery disease include diabetes mellitus, dyslipidemia, hypertension, obesity, post-menopausal and sedentary lifestyle. She is following a generally healthy and diabetic diet. She never participates in exercise. Her breakfast blood glucose is taken between 7-8 am. Her breakfast blood glucose range is generally 90-110 mg/dl. An ACE inhibitor/angiotensin II receptor blocker is being taken. Eye exam is current.  Hypertension This is a chronic problem. The current episode started more than 1 year ago. The problem has been gradually improving since onset. The problem is controlled. Pertinent negatives include no shortness of breath.     Past Medical History:  Diagnosis Date   Back pain    Bilateral edema of lower extremity    Bimalleolar ankle fracture    right   Constipation    Diabetes (HCC)    Food allergy    GERD (gastroesophageal reflux disease)    Hyperlipidemia    Hypertension    Joint pain    Lactose intolerance    LTBI (latent tuberculosis infection)     Other fatigue    Plaque psoriasis    Pre-diabetes    Shortness of breath on exertion    Sleep apnea    gastric bypass, lost 70lbs and no longer needs CPAP   Stomach ulcer      Family History  Problem Relation Age of Onset   Tuberculosis Mother    Heart Problems Mother    Dementia Mother    Stroke Mother    Alcoholism Father    Ovarian cancer Sister    Psoriasis Brother    Breast cancer Paternal Aunt 4   Alcoholism Other      Current Outpatient Medications:    aspirin 81 MG tablet, Take 81 mg by mouth daily., Disp: , Rfl:    CALCIUM PO, Take 650 mg by mouth daily., Disp: , Rfl:    Cholecalciferol (D3 5000) 125 MCG (5000 UT) capsule, 5,000 IU every other day, Disp: , Rfl:    Continuous Blood Gluc Receiver (DEXCOM G7 RECEIVER) DEVI, USE TO CHECK BLOOD SUGARS., Disp: 3 each, Rfl: 1   Continuous Blood Gluc Sensor (DEXCOM G7 SENSOR) MISC, USE TO CHECK BLOOD SUGARS E11.65, Disp: 3 each, Rfl: 1   COSENTYX 150 MG/ML SOSY, Inject into the skin every 30 (thirty) days., Disp: , Rfl:    Cyanocobalamin (B-12) 500 MCG TABS, 400- 500 mcg daily, Disp: , Rfl:    ELDERBERRY PO, Take 50 mg by mouth daily., Disp: , Rfl:    EPINEPHRINE 0.3 mg/0.3 mL IJ  SOAJ injection, INJECT AS DIRECTED FOR ANAPHYLAXIS SHOCK, Disp: 1 Device, Rfl: 2   escitalopram (LEXAPRO) 10 MG tablet, Take 1 tablet (10 mg total) by mouth daily., Disp: 30 tablet, Rfl: 2   Ginger 500 MG CAPS, Take 2 capsules by mouth daily., Disp: , Rfl:    hydrochlorothiazide (MICROZIDE) 12.5 MG capsule, TAKE 1 CAPSULE BY MOUTH EVERY DAY, Disp: 90 capsule, Rfl: 1   lisinopril (ZESTRIL) 5 MG tablet, Take 1 tablet (5 mg total) by mouth daily., Disp: 90 tablet, Rfl: 1   Magnesium 250 MG TABS, Take 1 tablet by mouth daily., Disp: , Rfl:    POTASSIUM PO, Take 99 mg by mouth daily., Disp: , Rfl:    Prenatal Vit-Fe Fumarate-FA (PRENATAL PO), Take 2 tablets by mouth daily., Disp: , Rfl:    rosuvastatin (CRESTOR) 5 MG tablet, Take 1 tablet (5 mg total) by  mouth daily., Disp: 90 tablet, Rfl: 1   Semaglutide (RYBELSUS) 7 MG TABS, Take 7 mg by mouth daily., Disp: 90 tablet, Rfl: 0   Turmeric 500 MG CAPS, Take 2 tablets by mouth daily., Disp: , Rfl:    Allergies  Allergen Reactions   Fish Allergy Anaphylaxis    Cod fish    Milk-Related Compounds Anaphylaxis    Goat Milk ONLY    Penicillins Hives     Review of Systems  Constitutional: Negative.   Respiratory: Negative.  Negative for shortness of breath.   Cardiovascular: Negative.   Gastrointestinal: Negative.   Neurological: Negative.   Psychiatric/Behavioral:  Positive for dysphoric mood.        She states she is still feeling depressed. She is still grieving the loss of her husband. She is still living in their home, plans to move to [place].  She is interested in taking medication.      Today's Vitals   02/08/22 0826 02/08/22 0834 02/08/22 0835  BP: 122/80 124/80 130/78  Pulse: 76 73 73  Temp: 98 F (36.7 C)    Weight: 221 lb 9.6 oz (100.5 kg)    Height: 5' 3"  (1.6 m)    PainSc: 0-No pain     Body mass index is 39.25 kg/m.  Wt Readings from Last 3 Encounters:  02/08/22 221 lb 9.6 oz (100.5 kg)  11/28/21 214 lb 12.8 oz (97.4 kg)  11/15/21 215 lb (97.5 kg)     Objective:  Physical Exam Vitals and nursing note reviewed.  Constitutional:      Appearance: Normal appearance.  HENT:     Head: Normocephalic and atraumatic.     Right Ear: Tympanic membrane, ear canal and external ear normal. There is no impacted cerumen.     Left Ear: Tympanic membrane, ear canal and external ear normal.  Eyes:     Extraocular Movements: Extraocular movements intact.  Cardiovascular:     Rate and Rhythm: Normal rate and regular rhythm.     Heart sounds: Normal heart sounds.  Pulmonary:     Effort: Pulmonary effort is normal.     Breath sounds: Normal breath sounds.  Musculoskeletal:     Cervical back: Normal range of motion.  Skin:    General: Skin is warm.  Neurological:      General: No focal deficit present.     Mental Status: She is alert.  Psychiatric:        Mood and Affect: Mood normal.        Behavior: Behavior normal.      Assessment And Plan:  1. Obesity, diabetes, and hypertension syndrome ([place]) Comments: Chronic, BP goal <130/80. NO need to change meds today. I will check labs as below, f/u in 4 months for Dm check.  - BMP8+EGFR - Hemoglobin A1c  2. 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 Adjustment disorder with depressed mood Comments: I will start her on escitalopram 74m daily and she agrees to Psychology referral for therapy. She will f/u in six weeks for re-evaluation.  - Ambulatory referral to Psychology  4. Class 2 severe obesity due to excess calories with serious comorbidity and body mass index (BMI) of 39.0 to 39.9 in adult ([facility] Comments: She has gained 7 lbs since July 2023. She is encouraged to incorporate at least 150 minutes of exercise per week.    Patient was given opportunity to ask questions. Patient verbalized understanding of the plan and was able to repeat key elements of the plan. All questions were answered to their satisfaction.   I, RMaximino Greenland MD, have reviewed all documentation for this visit. The documentation on 02/08/22 for the exam, diagnosis, procedures, and orders are all accurate and complete.   IF YOU HAVE BEEN REFERRED TO A SPECIALIST, IT MAY TAKE 1-2 WEEKS TO SCHEDULE/PROCESS THE REFERRAL. IF YOU HAVE NOT HEARD FROM US/SPECIALIST IN TWO WEEKS, PLEASE GIVE [employer] CALL AT [phone].   THE PATIENT IS ENCOURAGED TO PRACTICE SOCIAL DISTANCING DUE TO THE COVID-19 PANDEMIC.

## 2022-02-08 NOTE — Patient Instructions (Signed)

## 2022-02-09 ENCOUNTER — Other Ambulatory Visit: Payer: Self-pay | Admitting: Internal Medicine

## 2022-02-09 DIAGNOSIS — I152 Hypertension secondary to endocrine disorders: Secondary | ICD-10-CM

## 2022-02-09 LAB — BMP8+EGFR
BUN/Creatinine Ratio: 29 — ABNORMAL HIGH (ref 12–28)
BUN: 25 mg/dL (ref 8–27)
CO2: 25 mmol/L (ref 20–29)
Calcium: 9.8 mg/dL (ref 8.7–10.3)
Chloride: 102 mmol/L (ref 96–106)
Creatinine, Ser: 0.87 mg/dL (ref 0.57–1.00)
Glucose: 108 mg/dL — ABNORMAL HIGH (ref 70–99)
Potassium: 4.2 mmol/L (ref 3.5–5.2)
Sodium: 142 mmol/L (ref 134–144)
eGFR: 73 mL/min/{1.73_m2} (ref >59)

## 2022-02-09 LAB — HEMOGLOBIN A1C
Est. average glucose Bld gHb Est-mCnc: 131 mg/dL
Hgb A1c MFr Bld: 6.2 % — ABNORMAL HIGH (ref 4.8–5.6)

## 2022-02-12 DIAGNOSIS — R5382 Chronic fatigue, unspecified: Secondary | ICD-10-CM | POA: Diagnosis not present

## 2022-02-12 DIAGNOSIS — E782 Mixed hyperlipidemia: Secondary | ICD-10-CM | POA: Diagnosis not present

## 2022-02-12 DIAGNOSIS — G4700 Insomnia, unspecified: Secondary | ICD-10-CM | POA: Diagnosis not present

## 2022-02-12 DIAGNOSIS — I10 Essential (primary) hypertension: Secondary | ICD-10-CM | POA: Diagnosis not present

## 2022-02-14 ENCOUNTER — Telehealth: Payer: Self-pay | Admitting: *Deleted

## 2022-02-14 ENCOUNTER — Other Ambulatory Visit: Payer: Self-pay

## 2022-02-14 MED ORDER — HYDROCHLOROTHIAZIDE 12.5 MG PO CAPS: ORAL_CAPSULE | ORAL | 1 refills | Status: DC

## 2022-02-14 NOTE — Telephone Encounter (Signed)
Contacted patient regarding PREP Class referral. She is interested in attending at the [facility]. She works in [place] and would need a midday class time. We will be back in touch with availability.

## 2022-02-22 DIAGNOSIS — I10 Essential (primary) hypertension: Secondary | ICD-10-CM | POA: Diagnosis not present

## 2022-02-26 LAB — HM DIABETES EYE EXAM

## 2022-02-28 ENCOUNTER — Ambulatory Visit
Admission: RE | Admit: 2022-02-28 | Discharge: 2022-02-28 | Disposition: A | Discharge status: Home / Self Care | Payer: Medicare PPO | Source: Ambulatory Visit | Attending: Internal Medicine | Admitting: Internal Medicine

## 2022-02-28 DIAGNOSIS — Z1231 Encounter for screening mammogram for malignant neoplasm of breast: Secondary | ICD-10-CM | POA: Diagnosis not present

## 2022-03-01 DIAGNOSIS — Z0000 Encounter for general adult medical examination without abnormal findings: Secondary | ICD-10-CM | POA: Diagnosis not present

## 2022-03-01 DIAGNOSIS — Z01411 Encounter for gynecological examination (general) (routine) with abnormal findings: Secondary | ICD-10-CM | POA: Diagnosis not present

## 2022-03-01 DIAGNOSIS — Z124 Encounter for screening for malignant neoplasm of cervix: Secondary | ICD-10-CM | POA: Diagnosis not present

## 2022-03-01 DIAGNOSIS — Z01419 Encounter for gynecological examination (general) (routine) without abnormal findings: Secondary | ICD-10-CM | POA: Diagnosis not present

## 2022-03-01 DIAGNOSIS — Z8041 Family history of malignant neoplasm of ovary: Secondary | ICD-10-CM | POA: Diagnosis not present

## 2022-03-01 DIAGNOSIS — Z6834 Body mass index (BMI) 34.0-34.9, adult: Secondary | ICD-10-CM | POA: Diagnosis not present

## 2022-03-03 ENCOUNTER — Other Ambulatory Visit: Payer: Self-pay | Admitting: Internal Medicine

## 2022-03-20 ENCOUNTER — Ambulatory Visit: Payer: Medicare PPO | Admitting: Internal Medicine

## 2022-03-22 ENCOUNTER — Ambulatory Visit: Payer: Medicare PPO | Admitting: Internal Medicine

## 2022-04-03 ENCOUNTER — Encounter: Payer: Self-pay | Admitting: Internal Medicine

## 2022-04-19 ENCOUNTER — Encounter: Payer: Self-pay | Admitting: Emergency Medicine

## 2022-04-19 ENCOUNTER — Ambulatory Visit
Admission: EM | Admit: 2022-04-19 | Discharge: 2022-04-19 | Disposition: A | Discharge status: Home / Self Care | Payer: Medicare PPO | Attending: Emergency Medicine | Admitting: Emergency Medicine

## 2022-04-19 DIAGNOSIS — B349 Viral infection, unspecified: Secondary | ICD-10-CM | POA: Insufficient documentation

## 2022-04-19 DIAGNOSIS — R051 Acute cough: Secondary | ICD-10-CM | POA: Diagnosis not present

## 2022-04-19 DIAGNOSIS — Z8616 Personal history of COVID-19: Secondary | ICD-10-CM | POA: Insufficient documentation

## 2022-04-19 DIAGNOSIS — R0981 Nasal congestion: Secondary | ICD-10-CM | POA: Insufficient documentation

## 2022-04-19 DIAGNOSIS — Z1152 Encounter for screening for COVID-19: Secondary | ICD-10-CM | POA: Insufficient documentation

## 2022-04-19 DIAGNOSIS — R52 Pain, unspecified: Secondary | ICD-10-CM | POA: Diagnosis present

## 2022-04-19 LAB — RESP PANEL BY RT-PCR (RSV, FLU A&B, COVID)  RVPGX2
Influenza A by PCR: NEGATIVE
Influenza B by PCR: NEGATIVE
Resp Syncytial Virus by PCR: NEGATIVE
SARS Coronavirus 2 by RT PCR: NEGATIVE

## 2022-04-19 MED ORDER — PSEUDOEPH-BROMPHEN-DM 30-2-10 MG/5ML PO SYRP: 10.0000 mL | ORAL_SOLUTION | Freq: Four times a day (QID) | ORAL | 0 refills | Status: AC | PRN

## 2022-04-19 NOTE — Discharge Instructions (Addendum)
-  I will contact you on MyChart with your lab results.  If you have COVID I will send antiviral medicine need to make sure you isolate 5 days and wear mask for 5 days.  If you have the flu you are out of the window for treatment with Tamiflu and care is supportive.  I sent cough medicine and you should take Tylenol and increase your rest and fluid intake.  Most people feel better within a week. - You need to be seen again if you have any uncontrolled fever, weakness or breathing difficulty not relieved by use of your inhaler.

## 2022-04-19 NOTE — ED Triage Notes (Signed)
Pt presents with a cough, chest congestion, runny nose, chills and bodyaches x 4 days.

## 2022-04-19 NOTE — ED Provider Notes (Signed)
[facility]    CSN: [id number] Arrival date & time: 04/19/22  0856      History   Chief Complaint Chief Complaint  Patient presents with   Chills   Cough   Nasal Congestion   Generalized Body Aches    HPI Teresa Newton is a 66 y.o. female presenting for 3 to 4-day history of cough, congestion, fatigue, body aches, chills, occasional shortness of breath.  Also reports reduced appetite.  No fever.  Mild sore throat.  No vomiting or diarrhea.  No sick contacts.  Taking over-the-counter Alka-Seltzer.  States she feels like she has the flu.  Her medical history significant for diabetes, GERD, hypertension, hyperlipidemia, psoriasis, sleep apnea.  She reports that she occasionally needs to use inhaler since having COVID-19 in the past.  No other complaints.  HPI  Past Medical History:  Diagnosis Date   Back pain    Bilateral edema of lower extremity    Bimalleolar ankle fracture    right   Constipation    Diabetes (HCC)    Food allergy    GERD (gastroesophageal reflux disease)    Hyperlipidemia    Hypertension    Joint pain    Lactose intolerance    LTBI (latent tuberculosis infection)    Other fatigue    Plaque psoriasis    Pre-diabetes    Shortness of breath on exertion    Sleep apnea    gastric bypass, lost 70lbs and no longer needs CPAP   Stomach ulcer     Patient Active Problem List   Diagnosis Date Noted   Diabetes mellitus ([place]) 06/14/2021   Mixed diabetic hyperlipidemia associated with type 2 diabetes mellitus ([place]) 06/14/2021   Lactose intolerance 06/14/2021   High serum vitamin D 04/04/2021   Hypertension associated with diabetes ([place]) 11/04/2017   Obesity, diabetes, and hypertension syndrome ([place]) 11/04/2017   Class 2 severe obesity due to excess calories with serious comorbidity and body mass index (BMI) of 36.0 to 36.9 in adult ([place]) 11/04/2017   Body mass index 36.0-36.9, adult 11/04/2017   Psoriasis 10/18/2015   LTBI (latent  tuberculosis infection) 10/18/2015    Past Surgical History:  Procedure Laterality Date   COLONOSCOPY     FOOT SURGERY Right    GASTRIC BYPASS     ORIF ANKLE FRACTURE Right 01/30/2018   Procedure: OPEN REDUCTION INTERNAL FIXATION (ORIF) right ankle trimalleolar fracture;  Surgeon: Wylene Simmer, MD;  Location: [facility];  Service: Orthopedics;  Laterality: Right;   TUBAL LIGATION      OB History     Gravida  6   Para  4   Term      Preterm      AB      Living         SAB      IAB      Ectopic      Multiple      Live Births               Home Medications    Prior to Admission medications   Medication Sig Start Date End Date Taking? Authorizing Provider  brompheniramine-pseudoephedrine-DM 30-2-10 MG/5ML syrup Take 10 mLs by mouth 4 (four) times daily as needed for up to 7 days. 04/19/22 04/26/22 Yes Danton Clap, PA-C  aspirin 81 MG tablet Take 81 mg by mouth daily.    [provider]  CALCIUM PO Take 650 mg by mouth daily.  [provider]  Cholecalciferol (D3 5000) 125 MCG (5000 UT) capsule 5,000 IU every other day 06/28/21   Thomasene Lot, DO  Continuous Blood Gluc Receiver (DEXCOM G7 RECEIVER) DEVI USE TO CHECK BLOOD SUGARS. 12/25/21   Dorothyann Peng, MD  Continuous Blood Gluc Sensor (DEXCOM G7 SENSOR) MISC USE TO CHECK BLOOD SUGARS E11.65 12/25/21   Dorothyann Peng, MD  COSENTYX 150 MG/ML SOSY Inject into the skin every 30 (thirty) days.    [provider]  Cyanocobalamin (B-12) 500 MCG TABS 400- 500 mcg daily 06/28/21   Opalski, Deborah, DO  ELDERBERRY PO Take 50 mg by mouth daily.    [provider]  EPINEPHRINE 0.3 mg/0.3 mL IJ SOAJ injection INJECT AS DIRECTED FOR ANAPHYLAXIS SHOCK 03/18/18   Dorothyann Peng, MD  escitalopram (LEXAPRO) 10 MG tablet TAKE 1 TABLET BY MOUTH EVERY DAY 03/05/22   Dorothyann Peng, MD  Ginger 500 MG CAPS Take 2 capsules by mouth daily.    [provider]   hydrochlorothiazide (MICROZIDE) 12.5 MG capsule TAKE 1 CAPSULE BY MOUTH EVERY DAY 02/14/22   Dorothyann Peng, MD  lisinopril (ZESTRIL) 5 MG tablet Take 1 tablet (5 mg total) by mouth daily. 12/25/21   Dorothyann Peng, MD  Magnesium 250 MG TABS Take 1 tablet by mouth daily.    [provider]  POTASSIUM PO Take 99 mg by mouth daily.    [provider]  Prenatal Vit-Fe Fumarate-FA (PRENATAL PO) Take 2 tablets by mouth daily.    [provider]  rosuvastatin (CRESTOR) 5 MG tablet Take 1 tablet (5 mg total) by mouth daily. 12/25/21   Dorothyann Peng, MD  Semaglutide (RYBELSUS) 7 MG TABS Take 7 mg by mouth daily. 09/11/21   [place]mire, Thermon Leyland, FNP  Turmeric 500 MG CAPS Take 2 tablets by mouth daily.    [provider]    Family History Family History  Problem Relation Age of Onset   Tuberculosis Mother    Heart Problems Mother    Dementia Mother    Stroke Mother    Alcoholism Father    Ovarian cancer Sister    Psoriasis Brother    Breast cancer Paternal Aunt 61   Alcoholism Other     Social History Social History   Tobacco Use   Smoking status: Former    Packs/day: 1.00    Years: 3.00    Total pack years: 3.00    Types: Cigarettes    Start date: 05/07/1972    Quit date: 05/06/1976    Years since quitting: 45.9   Smokeless tobacco: Never  Vaping Use   Vaping Use: Never used  Substance Use Topics   Alcohol use: No    Alcohol/week: 0.0 standard drinks of alcohol   Drug use: No     Allergies   Fish allergy, Milk-related compounds, and Penicillins   Review of Systems Review of Systems  Constitutional:  Positive for appetite change, chills and fatigue. Negative for diaphoresis and fever.  HENT:  Positive for congestion, rhinorrhea and sore throat. Negative for ear pain, sinus pressure and sinus pain.   Respiratory:  Positive for cough and shortness of breath.   Gastrointestinal:  Negative for abdominal pain, nausea and vomiting.   Musculoskeletal:  Positive for myalgias. Negative for arthralgias.  Skin:  Negative for rash.  Neurological:  Negative for weakness and headaches.  Hematological:  Negative for adenopathy.     Physical Exam Triage Vital Signs ED Triage Vitals  Enc Vitals Group  BP 04/19/22 1019 133/70     Pulse Rate 04/19/22 1019 75     Resp 04/19/22 1019 16     Temp 04/19/22 1019 98.4 F (36.9 C)     Temp Source 04/19/22 1019 Oral     SpO2 04/19/22 1019 98 %     Weight --      Height --      Head Circumference --      Peak Flow --      Pain Score 04/19/22 1015 0     Pain Loc --      Pain Edu? --      Excl. in [place]? --    No data found.  Updated Vital Signs BP 133/70 (BP Location: Right Arm)   Pulse 75   Temp 98.4 F (36.9 C) (Oral)   Resp 16   SpO2 98%   Physical Exam Vitals and nursing note reviewed.  Constitutional:      General: She is not in acute distress.    Appearance: Normal appearance. She is not ill-appearing or toxic-appearing.  HENT:     Head: Normocephalic and atraumatic.     Nose: Congestion present.     Mouth/Throat:     Mouth: Mucous membranes are moist.     Pharynx: Oropharynx is clear. Posterior oropharyngeal erythema present.  Eyes:     General: No scleral icterus.       Right eye: No discharge.        Left eye: No discharge.     Conjunctiva/sclera: Conjunctivae normal.  Cardiovascular:     Rate and Rhythm: Normal rate and regular rhythm.     Heart sounds: Normal heart sounds.  Pulmonary:     Effort: Pulmonary effort is normal. No respiratory distress.     Breath sounds: Normal breath sounds.  Musculoskeletal:     Cervical back: Neck supple.  Skin:    General: Skin is dry.  Neurological:     General: No focal deficit present.     Mental Status: She is alert. Mental status is at baseline.     Motor: No weakness.     Gait: Gait normal.  Psychiatric:        Mood and Affect: Mood normal.        Behavior: Behavior normal.        Thought Content:  Thought content normal.      UC Treatments / Results  Labs (all labs ordered are listed, but only abnormal results are displayed) Labs Reviewed  RESP PANEL BY RT-PCR (RSV, FLU A&B, COVID)  [id number]    EKG   Radiology No results found.  Procedures Procedures (including critical care time)  Medications Ordered in UC Medications - No data to display  Initial Impression / Assessment and Plan / UC Course  I have reviewed the triage vital signs and the nursing notes.  Pertinent labs & imaging results that were available during my care of the patient were reviewed by me and considered in my medical decision making (see chart for details).   66-old female presents for 3 to 4-day history of cough, congestion, fatigue, sore throat, body aches, shortness of breath.  Vitals normal and stable.  Patient overall well-appearing and in no acute distress.  On exam she has nasal congestion and mild erythema posterior pharynx.  Chest clear auscultation heart regular rate rhythm.  Respiratory panel obtained.  Advised patient I will contact her on MyChart with the results.  Sent Bromfed-DM to pharmacy for cough.  Encourage plenty rest and fluids, Tylenol as needed.  All negative respiratory panel.  Sent patient a message on MyChart.  Advise she has another viral illness and care is supportive.  I had sent cough medicine to pharmacy.  Reviewed return precautions.  Final Clinical Impressions(s) / UC Diagnoses   Final diagnoses:  Viral illness  Acute cough  Nasal congestion     Discharge Instructions      -I will contact you on MyChart with your lab results.  If you have COVID I will send antiviral medicine need to make sure you isolate 5 days and wear mask for 5 days.  If you have the flu you are out of the window for treatment with Tamiflu and care is supportive.  I sent cough medicine and you should take Tylenol and increase your rest and fluid intake.  Most people feel better within a  week. - You need to be seen again if you have any uncontrolled fever, weakness or breathing difficulty not relieved by use of your inhaler.     ED Prescriptions     Medication Sig Dispense Auth. Provider   brompheniramine-pseudoephedrine-DM 30-2-10 MG/5ML syrup Take 10 mLs by mouth 4 (four) times daily as needed for up to 7 days. 150 mL Danton Clap, PA-C      PDMP not reviewed this encounter.   Danton Clap, PA-C 04/19/22 1132

## 2022-04-23 ENCOUNTER — Other Ambulatory Visit: Payer: Self-pay | Admitting: Internal Medicine

## 2022-04-24 ENCOUNTER — Ambulatory Visit: Payer: Medicare PPO | Admitting: Psychologist

## 2022-05-11 ENCOUNTER — Telehealth: Payer: Self-pay

## 2022-05-11 NOTE — Telephone Encounter (Signed)
Call to pt reference next PREP class starting on 06/05/22 T/TH 1p-215p  LVMT to pt requesting call back to discuss.

## 2022-05-14 ENCOUNTER — Encounter: Payer: Self-pay | Admitting: Internal Medicine

## 2022-05-14 ENCOUNTER — Ambulatory Visit: Payer: Medicare PPO | Admitting: Internal Medicine

## 2022-05-14 VITALS — BP 126/84 | HR 76 | Temp 98.4°F | Ht 63.0 in | Wt 222.0 lb

## 2022-05-14 DIAGNOSIS — Z6839 Body mass index (BMI) 39.0-39.9, adult: Secondary | ICD-10-CM | POA: Diagnosis not present

## 2022-05-14 DIAGNOSIS — E1159 Type 2 diabetes mellitus with other circulatory complications: Secondary | ICD-10-CM

## 2022-05-14 DIAGNOSIS — I152 Hypertension secondary to endocrine disorders: Secondary | ICD-10-CM | POA: Diagnosis not present

## 2022-05-14 DIAGNOSIS — E1169 Type 2 diabetes mellitus with other specified complication: Secondary | ICD-10-CM | POA: Diagnosis not present

## 2022-05-14 DIAGNOSIS — E7800 Pure hypercholesterolemia, unspecified: Secondary | ICD-10-CM | POA: Diagnosis not present

## 2022-05-14 DIAGNOSIS — E669 Obesity, unspecified: Secondary | ICD-10-CM | POA: Diagnosis not present

## 2022-05-14 MED ORDER — ESCITALOPRAM OXALATE 10 MG PO TABS: 10.0000 mg | ORAL_TABLET | Freq: Every day | ORAL | 1 refills | Status: DC

## 2022-05-14 MED ORDER — LISINOPRIL 5 MG PO TABS: 5.0000 mg | ORAL_TABLET | Freq: Every day | ORAL | 1 refills | Status: DC

## 2022-05-14 MED ORDER — RYBELSUS 7 MG PO TABS: 7.0000 mg | ORAL_TABLET | Freq: Every day | ORAL | 0 refills | Status: DC

## 2022-05-14 MED ORDER — ROSUVASTATIN CALCIUM 5 MG PO TABS: 5.0000 mg | ORAL_TABLET | Freq: Every day | ORAL | 1 refills | Status: DC

## 2022-05-14 NOTE — Progress Notes (Signed)
[name],acting as a Neurosurgeon for [name], MD.,have documented all relevant documentation on the behalf of [name], MD,as directed by  [name], MD while in the presence of [name], MD.    Subjective:     Patient ID: Teresa Newton , female    DOB: Feb 08, 1956 , 67 y.o.   MRN: [id number]   Chief Complaint  Patient presents with   Diabetes   Hypertension    HPI  Patient presents today for diabetes and bp f/u. She reports compliance with meds. She denies having any headaches, chest pain and shortness of breath.  She has no specific concerns.   Diabetes She presents for her follow-up diabetic visit. She has type 2 diabetes mellitus. Her disease course has been stable. Pertinent negatives for diabetes include no polydipsia, no polyphagia and no polyuria. There are no hypoglycemic complications. Risk factors for coronary artery disease include diabetes mellitus, dyslipidemia, hypertension, obesity, post-menopausal and sedentary lifestyle. She is following a generally healthy and diabetic diet. She never participates in exercise. Her breakfast blood glucose is taken between 7-8 am. Her breakfast blood glucose range is generally 90-110 mg/dl. An ACE inhibitor/angiotensin II receptor blocker is being taken. Eye exam is current.  Hypertension This is a chronic problem. The current episode started more than 1 year ago. The problem has been gradually improving since onset. The problem is controlled.     Past Medical History:  Diagnosis Date   Back pain    Bilateral edema of lower extremity    Bimalleolar ankle fracture    right   Constipation    Diabetes (HCC)    Food allergy    GERD (gastroesophageal reflux disease)    Hyperlipidemia    Hypertension    Joint pain    Lactose intolerance    LTBI (latent tuberculosis infection)    Other fatigue    Plaque psoriasis    Pre-diabetes    Shortness of breath on exertion    Sleep apnea    gastric  bypass, lost 70lbs and no longer needs CPAP   Stomach ulcer      Family History  Problem Relation Age of Onset   Tuberculosis Mother    Heart Problems Mother    Dementia Mother    Stroke Mother    Alcoholism Father    Ovarian cancer Sister    Psoriasis Brother    Breast cancer Paternal Aunt 42   Alcoholism Other      Current Outpatient Medications:    aspirin 81 MG tablet, Take 81 mg by mouth daily., Disp: , Rfl:    CALCIUM PO, Take 650 mg by mouth daily., Disp: , Rfl:    Cholecalciferol (D3 5000) 125 MCG (5000 UT) capsule, 5,000 IU every other day, Disp: , Rfl:    Continuous Blood Gluc Receiver (DEXCOM G7 RECEIVER) DEVI, USE TO CHECK BLOOD SUGARS., Disp: 3 each, Rfl: 1   Continuous Blood Gluc Sensor (DEXCOM G7 SENSOR) MISC, USE TO CHECK BLOOD SUGARS E11.65, Disp: 3 each, Rfl: 1   COSENTYX 150 MG/ML SOSY, Inject into the skin every 30 (thirty) days., Disp: , Rfl:    Cyanocobalamin (B-12) 500 MCG TABS, 400- 500 mcg daily, Disp: , Rfl:    ELDERBERRY PO, Take 50 mg by mouth daily., Disp: , Rfl:    EPINEPHRINE 0.3 mg/0.3 mL IJ SOAJ injection, INJECT AS DIRECTED FOR ANAPHYLAXIS SHOCK, Disp: 1 Device, Rfl: 2   escitalopram (LEXAPRO) 10 MG tablet, Take 1  tablet (10 mg total) by mouth daily., Disp: 90 tablet, Rfl: 1   Ginger 500 MG CAPS, Take 2 capsules by mouth daily., Disp: , Rfl:    hydrochlorothiazide (MICROZIDE) 12.5 MG capsule, TAKE 1 CAPSULE BY MOUTH EVERY DAY, Disp: 90 capsule, Rfl: 1   lisinopril (ZESTRIL) 5 MG tablet, Take 1 tablet (5 mg total) by mouth daily., Disp: 90 tablet, Rfl: 1   Magnesium 250 MG TABS, Take 1 tablet by mouth daily., Disp: , Rfl:    POTASSIUM PO, Take 99 mg by mouth daily., Disp: , Rfl:    Prenatal Vit-Fe Fumarate-FA (PRENATAL PO), Take 2 tablets by mouth daily., Disp: , Rfl:    rosuvastatin (CRESTOR) 5 MG tablet, Take 1 tablet (5 mg total) by mouth daily., Disp: 90 tablet, Rfl: 1   Semaglutide (RYBELSUS) 7 MG TABS, Take 7 mg by mouth daily., Disp: 90  tablet, Rfl: 0   Turmeric 500 MG CAPS, Take 2 tablets by mouth daily., Disp: , Rfl:    Allergies  Allergen Reactions   Fish Allergy Anaphylaxis    Cod fish    Milk-Related Compounds Anaphylaxis    Goat Milk ONLY    Penicillins Hives     Review of Systems  Constitutional: Negative.   Eyes: Negative.   Respiratory: Negative.    Cardiovascular: Negative.   Endocrine: Negative for polydipsia, polyphagia and polyuria.  Musculoskeletal: Negative.   Skin: Negative.   Neurological: Negative.   Psychiatric/Behavioral: Negative.       Today's Vitals   05/14/22 1156  BP: 126/84  Pulse: 76  Temp: 98.4 F (36.9 C)  Weight: 222 lb (100.7 kg)  Height: 5\' 3"  (1.6 m)  PainSc: 8   PainLoc: Leg   Body mass index is 39.33 kg/m.  Wt Readings from Last 3 Encounters:  05/14/22 222 lb (100.7 kg)  02/08/22 221 lb 9.6 oz (100.5 kg)  11/28/21 214 lb 12.8 oz (97.4 kg)    Objective:  Physical Exam Vitals and nursing note reviewed.  Constitutional:      Appearance: Normal appearance. She is obese.  HENT:     Head: Normocephalic and atraumatic.     Nose:     Comments: Masked     Mouth/Throat:     Comments: Masked  Cardiovascular:     Rate and Rhythm: Normal rate and regular rhythm.     Heart sounds: Normal heart sounds.  Pulmonary:     Effort: Pulmonary effort is normal.     Breath sounds: Normal breath sounds.  Musculoskeletal:     Cervical back: Normal range of motion.  Skin:    General: Skin is warm.  Neurological:     General: No focal deficit present.     Mental Status: She is alert.  Psychiatric:        Mood and Affect: Mood normal.        Behavior: Behavior normal.       Assessment And Plan:     1. Obesity, diabetes, and hypertension syndrome ([place]) Comments: Chronic, she is on ACE inhibitor therapy. I will check labs as below. I will increase Rybelsus to 7mg  daily. Reminded to incorporate more strength training into her workout routine.  - Semaglutide (RYBELSUS) 7  MG TABS; Take 7 mg by mouth daily.  Dispense: 90 tablet; Refill: 0 - lisinopril (ZESTRIL) 5 MG tablet; Take 1 tablet (5 mg total) by mouth daily.  Dispense: 90 tablet; Refill: 1 - CMP14+EGFR - Hemoglobin A1c  2. Pure hypercholesterolemia Comments: Chronic, LDL  goal is less than 70. She will c/w rosuvastatin 5mg  daily.  3. Class 2 severe obesity due to excess calories with serious comorbidity and body mass index (BMI) of 39.0 to 39.9 in adult [facility]) Comments: She is s/p gastric bypass, now interested in conversion to gastric sleeve. She agrees to referral for Bariatric Surgery at [facility]. - Ambulatory referral to General Surgery   Patient was given opportunity to ask questions. Patient verbalized understanding of the plan and was able to repeat key elements of the plan. All questions were answered to their satisfaction.   I, [facility], MD, have reviewed all documentation for this visit. The documentation on 05/14/22 for the exam, diagnosis, procedures, and orders are all accurate and complete.   IF YOU HAVE BEEN REFERRED TO A SPECIALIST, IT MAY TAKE 1-2 WEEKS TO SCHEDULE/PROCESS THE REFERRAL. IF YOU HAVE NOT HEARD FROM US/SPECIALIST IN TWO WEEKS, PLEASE GIVE 07/13/22 A CALL AT [phone].   THE PATIENT IS ENCOURAGED TO PRACTICE SOCIAL DISTANCING DUE TO THE COVID-19 PANDEMIC.

## 2022-05-14 NOTE — Patient Instructions (Addendum)
Pam for the [facility] program [phone].   Start Rybelsus 7mg  daily NOW, after 30 days, increase to 7mg  PLUS 3mg  daily  Diabetes Mellitus and Nutrition, Adult When you have diabetes, or diabetes mellitus, it is very important to have healthy eating habits because your blood sugar (glucose) levels are greatly affected by what you eat and drink. Eating healthy foods in the right amounts, at about the same times every day, can help you: Manage your blood glucose. Lower your risk of heart disease. Improve your blood pressure. Reach or maintain a healthy weight. What can affect my meal plan? Every person with diabetes is different, and each person has different needs for a meal plan. Your health care provider may recommend that you work with a dietitian to make a meal plan that is best for you. Your meal plan may vary depending on factors such as: The calories you need. The medicines you take. Your weight. Your blood glucose, blood pressure, and cholesterol levels. Your activity level. Other health conditions you have, such as heart or kidney disease. How do carbohydrates affect me? Carbohydrates, also called carbs, affect your blood glucose level more than any other type of food. Eating carbs raises the amount of glucose in your blood. It is important to know how many carbs you can safely have in each meal. This is different for every person. Your dietitian can help you calculate how many carbs you should have at each meal and for each snack. How does alcohol affect me? Alcohol can cause a decrease in blood glucose (hypoglycemia), especially if you use insulin or take certain diabetes medicines by mouth. Hypoglycemia can be a life-threatening condition. Symptoms of hypoglycemia, such as sleepiness, dizziness, and confusion, are similar to symptoms of having too much alcohol. Do not drink alcohol if: Your health care provider tells you not to drink. You are pregnant, may be pregnant, or are  planning to become pregnant. If you drink alcohol: Limit how much you have to: 0-1 drink a day for women. 0-2 drinks a day for men. Know how much alcohol is in your drink. In the [place] one drink equals one 12 oz bottle of beer (355 mL), one 5 oz glass of wine (148 mL), or one 1 oz glass of hard liquor (44 mL). Keep yourself hydrated with water, diet soda, or unsweetened iced tea. Keep in mind that regular soda, juice, and other mixers may contain a lot of sugar and must be counted as carbs. What are tips for following this plan?  Reading food labels Start by checking the serving size on the Nutrition Facts label of packaged foods and drinks. The number of calories and the amount of carbs, fats, and other nutrients listed on the label are based on one serving of the item. Many items contain more than one serving per package. Check the total grams (g) of carbs in one serving. Check the number of grams of saturated fats and trans fats in one serving. Choose foods that have a low amount or none of these fats. Check the number of milligrams (mg) of salt (sodium) in one serving. Most people should limit total sodium intake to less than 2,300 mg per day. Always check the nutrition information of foods labeled as "low-fat" or "nonfat." These foods may be higher in added sugar or refined carbs and should be avoided. Talk to your dietitian to identify your daily goals for nutrients listed on the label. Shopping Avoid buying canned, pre-made, or processed  foods. These foods tend to be high in fat, sodium, and added sugar. Shop around the outside edge of the grocery store. This is where you will most often find fresh fruits and vegetables, bulk grains, fresh meats, and fresh dairy products. Cooking Use low-heat cooking methods, such as baking, instead of high-heat cooking methods, such as deep frying. Cook using healthy oils, such as olive, canola, or sunflower oil. Avoid cooking with butter, cream, or  high-fat meats. Meal planning Eat meals and snacks regularly, preferably at the same times every day. Avoid going long periods of time without eating. Eat foods that are high in fiber, such as fresh fruits, vegetables, beans, and whole grains. Eat 4-6 oz (112-168 g) of lean protein each day, such as lean meat, chicken, fish, eggs, or tofu. One ounce (oz) (28 g) of lean protein is equal to: 1 oz (28 g) of meat, chicken, or fish. 1 egg.  cup (62 g) of tofu. Eat some foods each day that contain healthy fats, such as avocado, nuts, seeds, and fish. What foods should I eat? Fruits Berries. Apples. Oranges. Peaches. Apricots. Plums. Grapes. Mangoes. Papayas. Pomegranates. Kiwi. Cherries. Vegetables Leafy greens, including lettuce, spinach, kale, chard, collard greens, mustard greens, and cabbage. Beets. Cauliflower. Broccoli. Carrots. Green beans. Tomatoes. Peppers. Onions. Cucumbers. Brussels sprouts. Grains Whole grains, such as whole-wheat or whole-grain bread, crackers, tortillas, cereal, and pasta. Unsweetened oatmeal. Quinoa. Brown or wild rice. Meats and other proteins Seafood. Poultry without skin. Lean cuts of poultry and beef. Tofu. Nuts. Seeds. Dairy Low-fat or fat-free dairy products such as milk, yogurt, and cheese. The items listed above may not be a complete list of foods and beverages you can eat and drink. Contact a dietitian for more information. What foods should I avoid? Fruits Fruits canned with syrup. Vegetables Canned vegetables. Frozen vegetables with butter or cream sauce. Grains Refined white flour and flour products such as bread, pasta, snack foods, and cereals. Avoid all processed foods. Meats and other proteins Fatty cuts of meat. Poultry with skin. Breaded or fried meats. Processed meat. Avoid saturated fats. Dairy Full-fat yogurt, cheese, or milk. Beverages Sweetened drinks, such as soda or iced tea. The items listed above may not be a complete list of  foods and beverages you should avoid. Contact a dietitian for more information. Questions to ask a health care provider Do I need to meet with a certified diabetes care and education specialist? Do I need to meet with a dietitian? What number can I call if I have questions? When are the best times to check my blood glucose? Where to find more information: American Diabetes Association: diabetes.org Academy of Nutrition and Dietetics: eatright.[employer] of Diabetes and Digestive and Kidney Diseases: [email or web address].si Association of Diabetes Care & Education Specialists: diabeteseducator.org Summary It is important to have healthy eating habits because your blood sugar (glucose) levels are greatly affected by what you eat and drink. It is important to use alcohol carefully. A healthy meal plan will help you manage your blood glucose and lower your risk of heart disease. Your health care provider may recommend that you work with a dietitian to make a meal plan that is best for you. This information is not intended to replace advice given to you by your health care provider. Make sure you discuss any questions you have with your health care provider. Document Revised: 11/25/2019 Document Reviewed: 11/25/2019 Elsevier Patient Education  2023 [employer].

## 2022-05-15 ENCOUNTER — Telehealth: Payer: Self-pay

## 2022-05-15 LAB — CMP14+EGFR
ALT: 19 IU/L (ref 0–32)
AST: 24 IU/L (ref 0–40)
Albumin/Globulin Ratio: 1.6 (ref 1.2–2.2)
Albumin: 4.5 g/dL (ref 3.9–4.9)
Alkaline Phosphatase: 92 IU/L (ref 44–121)
BUN/Creatinine Ratio: 25 (ref 12–28)
BUN: 22 mg/dL (ref 8–27)
Bilirubin Total: 0.6 mg/dL (ref 0.0–1.2)
CO2: 27 mmol/L (ref 20–29)
Calcium: 9.6 mg/dL (ref 8.7–10.3)
Chloride: 101 mmol/L (ref 96–106)
Creatinine, Ser: 0.87 mg/dL (ref 0.57–1.00)
Globulin, Total: 2.9 g/dL (ref 1.5–4.5)
Glucose: 99 mg/dL (ref 70–99)
Potassium: 4.4 mmol/L (ref 3.5–5.2)
Sodium: 141 mmol/L (ref 134–144)
Total Protein: 7.4 g/dL (ref 6.0–8.5)
eGFR: 73 mL/min/{1.73_m2} (ref >59)

## 2022-05-15 LAB — HEMOGLOBIN A1C
Est. average glucose Bld gHb Est-mCnc: 131 mg/dL
Hgb A1c MFr Bld: 6.2 % — ABNORMAL HIGH (ref 4.8–5.6)

## 2022-05-15 NOTE — Telephone Encounter (Signed)
Spoke with pt this am. Offered next class 06/05/22 1p-215p but really needs either an am class (9a to 12N) or 4-5p. AM class at [facility] is full but could do an evening class. 6pm-715p T/TH at [facility], likely starting 1st week of Feb. Will call her back to schedule intake.

## 2022-06-13 DIAGNOSIS — Z9884 Bariatric surgery status: Secondary | ICD-10-CM | POA: Diagnosis not present

## 2022-06-14 ENCOUNTER — Other Ambulatory Visit: Payer: Self-pay | Admitting: General Surgery

## 2022-06-14 DIAGNOSIS — Z9884 Bariatric surgery status: Secondary | ICD-10-CM

## 2022-06-15 ENCOUNTER — Telehealth: Payer: Self-pay

## 2022-06-15 NOTE — Telephone Encounter (Signed)
Call to pt reference next PREP class starting on 06/26/22. Requested call back to discuss.

## 2022-06-19 ENCOUNTER — Other Ambulatory Visit: Payer: Self-pay | Admitting: General Surgery

## 2022-06-19 ENCOUNTER — Ambulatory Visit
Admission: RE | Admit: 2022-06-19 | Discharge: 2022-06-19 | Disposition: A | Discharge status: Home / Self Care | Payer: Medicare PPO | Source: Ambulatory Visit | Attending: General Surgery | Admitting: General Surgery

## 2022-06-19 DIAGNOSIS — Z9884 Bariatric surgery status: Secondary | ICD-10-CM

## 2022-06-19 DIAGNOSIS — K224 Dyskinesia of esophagus: Secondary | ICD-10-CM | POA: Diagnosis not present

## 2022-08-01 ENCOUNTER — Encounter: Payer: Self-pay | Admitting: Skilled Nursing Facility1

## 2022-08-01 ENCOUNTER — Encounter: Payer: Medicare PPO | Attending: General Surgery | Admitting: Skilled Nursing Facility1

## 2022-08-01 ENCOUNTER — Encounter (INDEPENDENT_AMBULATORY_CARE_PROVIDER_SITE_OTHER): Payer: Self-pay

## 2022-08-01 VITALS — Ht 63.0 in | Wt 217.5 lb

## 2022-08-01 DIAGNOSIS — G4733 Obstructive sleep apnea (adult) (pediatric): Secondary | ICD-10-CM | POA: Diagnosis not present

## 2022-08-01 DIAGNOSIS — E669 Obesity, unspecified: Secondary | ICD-10-CM

## 2022-08-01 DIAGNOSIS — E785 Hyperlipidemia, unspecified: Secondary | ICD-10-CM | POA: Insufficient documentation

## 2022-08-01 DIAGNOSIS — I10 Essential (primary) hypertension: Secondary | ICD-10-CM | POA: Diagnosis not present

## 2022-08-01 DIAGNOSIS — Z713 Dietary counseling and surveillance: Secondary | ICD-10-CM | POA: Insufficient documentation

## 2022-08-01 DIAGNOSIS — A159 Respiratory tuberculosis unspecified: Secondary | ICD-10-CM | POA: Diagnosis not present

## 2022-08-01 DIAGNOSIS — E1169 Type 2 diabetes mellitus with other specified complication: Secondary | ICD-10-CM

## 2022-08-01 DIAGNOSIS — E119 Type 2 diabetes mellitus without complications: Secondary | ICD-10-CM | POA: Insufficient documentation

## 2022-08-01 DIAGNOSIS — K219 Gastro-esophageal reflux disease without esophagitis: Secondary | ICD-10-CM | POA: Insufficient documentation

## 2022-08-01 NOTE — Progress Notes (Signed)
Bariatric Nutrition Follow-Up Visit Medical Nutrition Therapy    NUTRITION ASSESSMENT    Anthropometrics  Start weight at [facility]: 217.5 lbs (date: 08/01/2022)  Body Composition Scale 08/01/2022  Weight  lbs 217.5  Total Body Fat  % 45.2     Visceral Fat 17  Fat-Free Mass  % 54.7     Total Body Water  % 41.8     Muscle-Mass  lbs 27.7  BMI 38.1  Body Fat Displacement ---        Torso  lbs 60.8        Left Leg  lbs 12.1        Right Leg  lbs 12.1        Left Arm  lbs 6.0        Right Arm  lbs 6.0   Clinical  Medical hx: DM, GERD, OSA, hyperlipidemia, HTN, tuberculosis, RYGB 2016, stoamch ulcer  Medications: rybelsus: see list Labs: A1C 6.2   Lifestyle & Dietary Hx  Pt states she is allergic to cod fish only.   Pt states she was not successful with the surgery and the surgery did not work for her: after some discussion and assist the pt in seeing she got off her DM medication felt great the surgery indeed was successful.  Pt states she is trying to sell her house to afford things easier.  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 Pt states she is so happy to have her church. Pt states she tries to go walking and is going to get a walking tape.  Pt states she does not check her blood sugar.   Estimated daily fluid intake: unknown oz Estimated daily protein intake: 60+ g Supplements: magnesium, b12, potassium, prenatal multivitamin, calcium  Current average weekly physical activity: ADL's   24-Hr Dietary Recall:: 100% of meals is bought out First Meal: protein shake Snack:   Second Meal: shrimp + sweet potato  + broccoli Snack:   Third Meal: shrimp + sweet potato  + broccoli Snack: peanuts or protein shake Beverages: water, water + flavoring + caffeine   Post-Op Goals/ Signs/ Symptoms Using straws: no Drinking while eating: no Chewing/swallowing difficulties: no Changes in vision: no Changes to  mood/headaches: no Hair loss/changes to skin/nails: no Difficulty focusing/concentrating: no Sweating: no Limb weakness: no Dizziness/lightheadedness: no Palpitations: no  Carbonated/caffeinated beverages: no N/V/D/C/Gas: no Abdominal pain: no Dumping syndrome: no    NUTRITION DIAGNOSIS  Overweight/obesity ([place]-3.3) related to past poor dietary habits and physical inactivity as evidenced by completed bariatric surgery and following dietary guidelines for continued weight loss and healthy nutrition status.     NUTRITION INTERVENTION Nutrition counseling (C-1) and education (E-2) to facilitate bariatric surgery goals, including: The importance of consuming adequate calories as well as certain nutrients daily due to the body's need for essential vitamins, minerals, and fats The importance of daily physical activity and to reach a goal of at least 150 minutes of moderate to vigorous physical activity weekly (or as directed by their physician) due to benefits such as increased musculature and improved lab values The importance of intuitive eating specifically learning hunger-satiety cues and understanding the importance of learning a new body: The importance of mindful eating to avoid grazing behaviors  Educated pt on the importance of consistently eating throughout the day: Sustained Energy Levels: Regular meals and snacks help maintain stable blood sugar levels, providing a steady source of energy throughout the day.  This can prevent energy crashes and help you stay focused and alert. Creation of balanced and diverse meals to increase the intake of nutrient-rich foods that provide essential vitamins, minerals, fiber, and phytonutrients  Variety of Fruits and Vegetables:  Aim for a colorful array of fruits and vegetables to ensure a wide range of nutrients. Include a mix of leafy greens, berries, citrus fruits, cruciferous vegetables, and more. Whole Grains: Choose whole grains over refined  grains. Examples include brown rice, quinoa, oats, whole wheat, and barley. Lean Proteins: Include lean sources of protein, such as poultry, fish, tofu, legumes, beans, lentils, and low-fat dairy products. Limit red and processed meats. Healthy Fats: Incorporate sources of healthy fats, including avocados, nuts, seeds, and olive oil. Limit saturated and trans fats found in fried and processed foods. Dairy or Dairy Alternatives: Choose low-fat or fat-free dairy products, or plant-based alternatives like almond or soy milk. Portion Control: Be mindful of portion sizes to avoid overeating. Pay attention to hunger and satisfaction cues. Limit Added Sugars: Minimize the consumption of sugary beverages, snacks, and desserts. Check food labels for added sugars and opt for natural sources of sweetness such as whole fruits. Hydration: Drink plenty of water throughout the day. Limit sugary drinks and excessive caffeine intake. Moderate Sodium Intake: Reduce the consumption of high-sodium foods. Use herbs and spices for flavor instead of excessive salt. Meal Planning and Preparation: Plan and prepare meals ahead of time to make healthier choices more convenient. Include a mix of food groups in each meal. Limit Processed Foods: Minimize the intake of highly processed and packaged foods that are often high in added sugars, salt, and unhealthy fats. Regular Physical Activity: Combine a healthy diet with regular physical activity for overall well-being. Aim for at least 150 minutes of moderate-intensity aerobic exercise per week, along with strength training. Moderation and Balance: Enjoy treats and indulgent foods in moderation, emphasizing balance rather than strict restriction.  Goals: Take the appropriate multivitamin and 3 calcium separately by at least 2 hours  Do a youtube yoga or walking video 6 days a week Make most meals from home   Handouts Provided Include  Supplement sheet Grocery  shopping [employer] shopping  Learning Style & Readiness for Change Teaching method utilized: Visual & Auditory  Demonstrated degree of understanding via: Teach Back  Readiness Level: preparation  Barriers to learning/adherence to lifestyle change: unidentified   MONITORING & EVALUATION Dietary intake, weekly physical activity, body weight  Next Steps Patient is to follow-up in 3 months

## 2022-08-10 ENCOUNTER — Other Ambulatory Visit: Payer: Self-pay | Admitting: Internal Medicine

## 2022-08-15 DIAGNOSIS — Z9884 Bariatric surgery status: Secondary | ICD-10-CM | POA: Diagnosis not present

## 2022-08-16 ENCOUNTER — Encounter: Payer: Self-pay | Admitting: Internal Medicine

## 2022-08-16 ENCOUNTER — Ambulatory Visit: Payer: Medicare PPO | Admitting: Internal Medicine

## 2022-08-16 VITALS — BP 132/70 | HR 66 | Temp 98.2°F | Ht 63.0 in | Wt 217.6 lb

## 2022-08-16 DIAGNOSIS — E1169 Type 2 diabetes mellitus with other specified complication: Secondary | ICD-10-CM | POA: Diagnosis not present

## 2022-08-16 DIAGNOSIS — I152 Hypertension secondary to endocrine disorders: Secondary | ICD-10-CM

## 2022-08-16 DIAGNOSIS — E7800 Pure hypercholesterolemia, unspecified: Secondary | ICD-10-CM

## 2022-08-16 DIAGNOSIS — L989 Disorder of the skin and subcutaneous tissue, unspecified: Secondary | ICD-10-CM | POA: Diagnosis not present

## 2022-08-16 DIAGNOSIS — Z6838 Body mass index (BMI) 38.0-38.9, adult: Secondary | ICD-10-CM

## 2022-08-16 DIAGNOSIS — Z2821 Immunization not carried out because of patient refusal: Secondary | ICD-10-CM

## 2022-08-16 DIAGNOSIS — E669 Obesity, unspecified: Secondary | ICD-10-CM | POA: Diagnosis not present

## 2022-08-16 DIAGNOSIS — E1159 Type 2 diabetes mellitus with other circulatory complications: Secondary | ICD-10-CM | POA: Diagnosis not present

## 2022-08-16 MED ORDER — RYBELSUS 14 MG PO TABS: ORAL_TABLET | ORAL | 5 refills | Status: DC

## 2022-08-16 MED ORDER — RYBELSUS 14 MG PO TABS: ORAL_TABLET | ORAL | 2 refills | Status: DC

## 2022-08-16 NOTE — Progress Notes (Signed)
I,Victoria T Hamilton,acting as a scribe for [name], MD.,have documented all relevant documentation on the behalf of [name], MD,as directed by  [name], MD while in the presence of [name], MD.    Subjective:     Patient ID: Teresa Newton , female    DOB: Apr 24, 1956 , 67 y.o.   MRN: [id number]   Chief Complaint  Patient presents with   Diabetes   Hypertension    HPI  Patient presents today for diabetes and bp f/u. She reports compliance with meds. She denies having any headaches, chest pain and shortness of breath.  She has no specific concerns.    She reports having a knot on the top right side of her head, she notices the knot has grown.  She would also like to discuss Wegovy.   Diabetes She presents for her follow-up diabetic visit. She has type 2 diabetes mellitus. Her disease course has been stable. Pertinent negatives for diabetes include no polydipsia, no polyphagia and no polyuria. There are no hypoglycemic complications. Risk factors for coronary artery disease include diabetes mellitus, dyslipidemia, hypertension, obesity, post-menopausal and sedentary lifestyle. She is following a generally healthy and diabetic diet. She never participates in exercise. Her breakfast blood glucose is taken between 7-8 am. Her breakfast blood glucose range is generally 90-110 mg/dl. An ACE inhibitor/angiotensin II receptor blocker is being taken. Eye exam is current.  Hypertension This is a chronic problem. The current episode started more than 1 year ago. The problem has been gradually improving since onset. The problem is controlled.     Past Medical History:  Diagnosis Date   Back pain    Bilateral edema of lower extremity    Bimalleolar ankle fracture    right   Constipation    Diabetes    Food allergy    GERD (gastroesophageal reflux disease)    Hyperlipidemia    Hypertension    Joint pain    Lactose intolerance    LTBI (latent tuberculosis  infection)    Other fatigue    Plaque psoriasis    Pre-diabetes    Shortness of breath on exertion    Sleep apnea    gastric bypass, lost 70lbs and no longer needs CPAP   Stomach ulcer      Family History  Problem Relation Age of Onset   Tuberculosis Mother    Heart Problems Mother    Dementia Mother    Stroke Mother    Alcoholism Father    Ovarian cancer Sister    Psoriasis Brother    Breast cancer Paternal Aunt 33   Alcoholism Other      Current Outpatient Medications:    aspirin 81 MG tablet, Take 81 mg by mouth daily., Disp: , Rfl:    CALCIUM PO, Take 650 mg by mouth daily., Disp: , Rfl:    Cholecalciferol (D3 5000) 125 MCG (5000 UT) capsule, 5,000 IU every other day, Disp: , Rfl:    Continuous Blood Gluc Receiver (DEXCOM G7 RECEIVER) DEVI, USE TO CHECK BLOOD SUGARS., Disp: 3 each, Rfl: 1   COSENTYX 150 MG/ML SOSY, Inject into the skin every 30 (thirty) days., Disp: , Rfl:    Cyanocobalamin (B-12) 500 MCG TABS, 400- 500 mcg daily, Disp: , Rfl:    ELDERBERRY PO, Take 50 mg by mouth daily., Disp: , Rfl:    EPINEPHRINE 0.3 mg/0.3 mL IJ SOAJ injection, INJECT AS DIRECTED FOR ANAPHYLAXIS SHOCK, Disp: 1 Device, Rfl: 2  escitalopram (LEXAPRO) 10 MG tablet, Take 1 tablet (10 mg total) by mouth daily., Disp: 90 tablet, Rfl: 1   Ginger 500 MG CAPS, Take 2 capsules by mouth daily., Disp: , Rfl:    hydrochlorothiazide (MICROZIDE) 12.5 MG capsule, TAKE 1 CAPSULE BY MOUTH EVERY DAY, Disp: 90 capsule, Rfl: 1   lisinopril (ZESTRIL) 5 MG tablet, Take 1 tablet (5 mg total) by mouth daily., Disp: 90 tablet, Rfl: 1   Magnesium 250 MG TABS, Take 1 tablet by mouth daily., Disp: , Rfl:    POTASSIUM PO, Take 99 mg by mouth daily., Disp: , Rfl:    Prenatal Vit-Fe Fumarate-FA (PRENATAL PO), Take 2 tablets by mouth daily., Disp: , Rfl:    rosuvastatin (CRESTOR) 5 MG tablet, Take 1 tablet (5 mg total) by mouth daily., Disp: 90 tablet, Rfl: 1   Turmeric 500 MG CAPS, Take 2 tablets by mouth daily.,  Disp: , Rfl:    Continuous Blood Gluc Sensor (DEXCOM G7 SENSOR) MISC, USE TO CHECK BLOOD SUGARS E11.65 (Patient not taking: Reported on 08/16/2022), Disp: 3 each, Rfl: 1   Semaglutide (RYBELSUS) 14 MG TABS, One tab po qd, Disp: 90 tablet, Rfl: 2   Allergies  Allergen Reactions   Fish Allergy Anaphylaxis    Cod fish    Milk-Related Compounds Anaphylaxis    Goat Milk ONLY    Penicillins Hives     Review of Systems  Constitutional: Negative.   Respiratory: Negative.    Cardiovascular: Negative.   Gastrointestinal: Negative.   Endocrine: Negative for polydipsia, polyphagia and polyuria.  Skin: Negative.   Neurological: Negative.   Psychiatric/Behavioral: Negative.       Today's Vitals   08/16/22 0905  BP: 132/70  Pulse: 66  Temp: 98.2 F (36.8 C)  SpO2: 98%  Weight: 217 lb 9.6 oz (98.7 kg)  Height: 5\' 3"  (1.6 m)   Body mass index is 38.55 kg/m.  Wt Readings from Last 3 Encounters:  08/16/22 217 lb 9.6 oz (98.7 kg)  08/01/22 217 lb 8 oz (98.7 kg)  05/14/22 222 lb (100.7 kg)    Objective:  Physical Exam Vitals and nursing note reviewed.  Constitutional:      Appearance: Normal appearance. She is obese.  HENT:     Head: Normocephalic and atraumatic.     Nose:     Comments: Masked     Mouth/Throat:     Comments: Masked  Eyes:     Extraocular Movements: Extraocular movements intact.  Cardiovascular:     Rate and Rhythm: Normal rate and regular rhythm.     Heart sounds: Normal heart sounds.  Pulmonary:     Effort: Pulmonary effort is normal.     Breath sounds: Normal breath sounds.  Musculoskeletal:     Cervical back: Normal range of motion.  Skin:    General: Skin is warm.     Comments: Fluctuant cyst-like lesion on right forehead at hairline. No overlying erythema. No overlying rash. Not tender to palpation  Neurological:     General: No focal deficit present.     Mental Status: She is alert.  Psychiatric:        Mood and Affect: Mood normal.         Behavior: Behavior normal.       Assessment And Plan:     1. Obesity, diabetes, and hypertension syndrome Comments: Chronic, BP fair control. Goal BP<120/80. She will c/w lisniopril/hctz. I will check renal function today.  I will increase Rybelsus to 14mg   daily. - CBC - Hemoglobin A1c - Lipid panel - Microalbumin / creatinine urine ratio - BMP8+EGFR - Semaglutide (RYBELSUS) 14 MG TABS; One tab po qd  Dispense: 90 tablet; Refill: 2  2. Pure hypercholesterolemia Comments: Chronic, LDL Goal < 70. She will c/w rosuvastatin daily. - Lipid panel - BMP8+EGFR  3. Skin lesion Comments: Possibly sebaceous cyst. She has upcoming Derm appt.  4. Class 2 severe obesity due to excess calories with serious comorbidity and body mass index (BMI) of 38.0 to 38.9 in adult Comments: She is encouraged to aim for at least 150 minutes of exercise/week, while striving for BMI<30 to decrease cardiac risk.  5. Herpes zoster vaccination declined     Patient was given opportunity to ask questions. Patient verbalized understanding of the plan and was able to repeat key elements of the plan. All questions were answered to their satisfaction.   I, Gwynneth Alimentobyn N Erinn Huskins, MD, have reviewed all documentation for this visit. The documentation on 08/16/22 for the exam, diagnosis, procedures, and orders are all accurate and complete.   IF YOU HAVE BEEN REFERRED TO A SPECIALIST, IT MAY TAKE 1-2 WEEKS TO SCHEDULE/PROCESS THE REFERRAL. IF YOU HAVE NOT HEARD FROM US/SPECIALIST IN TWO WEEKS, PLEASE GIVE US A CALL AT [phone].   THE PATIENT IS ENCOURAGED TO PRACTICE SOCIAL DISTANCING DUE TO THE COVID-19 PANDEMIC.

## 2022-08-16 NOTE — Patient Instructions (Signed)

## 2022-08-17 LAB — CBC
Hematocrit: 39.2 % (ref 34.0–46.6)
Hemoglobin: 12.9 g/dL (ref 11.1–15.9)
MCH: 29.9 pg (ref 26.6–33.0)
MCHC: 32.9 g/dL (ref 31.5–35.7)
MCV: 91 fL (ref 79–97)
Platelets: 260 10*3/uL (ref 150–450)
RBC: 4.31 x10E6/uL (ref 3.77–5.28)
RDW: 12.6 % (ref 11.7–15.4)
WBC: 5.4 10*3/uL (ref 3.4–10.8)

## 2022-08-17 LAB — LIPID PANEL
Chol/HDL Ratio: 2 ratio (ref 0.0–4.4)
Cholesterol, Total: 127 mg/dL (ref 100–199)
HDL: 64 mg/dL (ref >39)
LDL Chol Calc (NIH): 50 mg/dL (ref 0–99)
Triglycerides: 57 mg/dL (ref 0–149)
VLDL Cholesterol Cal: 13 mg/dL (ref 5–40)

## 2022-08-17 LAB — HEMOGLOBIN A1C
Est. average glucose Bld gHb Est-mCnc: 134 mg/dL
Hgb A1c MFr Bld: 6.3 % — ABNORMAL HIGH (ref 4.8–5.6)

## 2022-08-17 LAB — BMP8+EGFR
BUN/Creatinine Ratio: 30 — ABNORMAL HIGH (ref 12–28)
BUN: 24 mg/dL (ref 8–27)
CO2: 28 mmol/L (ref 20–29)
Calcium: 9.6 mg/dL (ref 8.7–10.3)
Chloride: 97 mmol/L (ref 96–106)
Creatinine, Ser: 0.81 mg/dL (ref 0.57–1.00)
Glucose: 96 mg/dL (ref 70–99)
Potassium: 4 mmol/L (ref 3.5–5.2)
Sodium: 139 mmol/L (ref 134–144)
eGFR: 80 mL/min/{1.73_m2} (ref >59)

## 2022-08-17 LAB — MICROALBUMIN / CREATININE URINE RATIO
Creatinine, Urine: 86.8 mg/dL
Microalb/Creat Ratio: 9 mg/g creat (ref 0–29)
Microalbumin, Urine: 7.6 ug/mL

## 2022-08-22 DIAGNOSIS — L400 Psoriasis vulgaris: Secondary | ICD-10-CM | POA: Diagnosis not present

## 2022-08-22 DIAGNOSIS — Z79899 Other long term (current) drug therapy: Secondary | ICD-10-CM | POA: Diagnosis not present

## 2022-08-22 DIAGNOSIS — D170 Benign lipomatous neoplasm of skin and subcutaneous tissue of head, face and neck: Secondary | ICD-10-CM | POA: Diagnosis not present

## 2022-08-24 ENCOUNTER — Telehealth: Payer: Self-pay

## 2022-08-24 NOTE — Telephone Encounter (Signed)
Call to pt to offer 09/04/22 PREP class 1p-215p.  Due to work schedule needs am class.  Explained will call her at the start of the next 1030a class.

## 2022-10-30 ENCOUNTER — Ambulatory Visit: Payer: Medicare PPO | Admitting: Skilled Nursing Facility1

## 2022-11-25 ENCOUNTER — Other Ambulatory Visit: Payer: Self-pay | Admitting: Internal Medicine

## 2022-11-25 DIAGNOSIS — I152 Hypertension secondary to endocrine disorders: Secondary | ICD-10-CM

## 2022-12-05 ENCOUNTER — Ambulatory Visit: Payer: Medicare PPO

## 2022-12-06 ENCOUNTER — Encounter: Payer: Self-pay | Admitting: Internal Medicine

## 2022-12-12 ENCOUNTER — Ambulatory Visit: Payer: Medicare PPO

## 2022-12-15 ENCOUNTER — Other Ambulatory Visit: Payer: Self-pay | Admitting: Internal Medicine

## 2022-12-31 DIAGNOSIS — Z0289 Encounter for other administrative examinations: Secondary | ICD-10-CM

## 2023-01-01 ENCOUNTER — Ambulatory Visit (HOSPITAL_COMMUNITY)
Admission: RE | Admit: 2023-01-01 | Discharge: 2023-01-01 | Disposition: A | Discharge status: Home / Self Care | Payer: Medicare PPO | Source: Ambulatory Visit | Attending: Family Medicine | Admitting: Family Medicine

## 2023-01-01 ENCOUNTER — Encounter: Payer: Self-pay | Admitting: Family Medicine

## 2023-01-01 ENCOUNTER — Other Ambulatory Visit: Payer: Self-pay

## 2023-01-01 ENCOUNTER — Ambulatory Visit: Payer: Medicare PPO | Admitting: Family Medicine

## 2023-01-01 VITALS — BP 129/76 | HR 58 | Temp 97.7°F | Ht 63.0 in | Wt 214.0 lb

## 2023-01-01 DIAGNOSIS — F32A Depression, unspecified: Secondary | ICD-10-CM

## 2023-01-01 DIAGNOSIS — R0602 Shortness of breath: Secondary | ICD-10-CM

## 2023-01-01 DIAGNOSIS — E7849 Other hyperlipidemia: Secondary | ICD-10-CM

## 2023-01-01 DIAGNOSIS — Z6837 Body mass index (BMI) 37.0-37.9, adult: Secondary | ICD-10-CM | POA: Diagnosis not present

## 2023-01-01 DIAGNOSIS — Z9884 Bariatric surgery status: Secondary | ICD-10-CM | POA: Insufficient documentation

## 2023-01-01 DIAGNOSIS — E1169 Type 2 diabetes mellitus with other specified complication: Secondary | ICD-10-CM | POA: Diagnosis not present

## 2023-01-01 DIAGNOSIS — I10 Essential (primary) hypertension: Secondary | ICD-10-CM | POA: Diagnosis not present

## 2023-01-01 DIAGNOSIS — R5383 Other fatigue: Secondary | ICD-10-CM

## 2023-01-01 DIAGNOSIS — E669 Obesity, unspecified: Secondary | ICD-10-CM | POA: Diagnosis not present

## 2023-01-01 DIAGNOSIS — Z1331 Encounter for screening for depression: Secondary | ICD-10-CM

## 2023-01-01 DIAGNOSIS — Z7985 Long-term (current) use of injectable non-insulin antidiabetic drugs: Secondary | ICD-10-CM

## 2023-01-02 ENCOUNTER — Ambulatory Visit (INDEPENDENT_AMBULATORY_CARE_PROVIDER_SITE_OTHER): Payer: Medicare PPO

## 2023-01-02 VITALS — BP 118/62 | HR 65 | Temp 98.0°F | Ht 63.0 in | Wt 218.6 lb

## 2023-01-02 DIAGNOSIS — Z0000 Encounter for general adult medical examination without abnormal findings: Secondary | ICD-10-CM

## 2023-01-02 NOTE — Patient Instructions (Addendum)
Ms. Suddith , Thank you for taking time to come for your Medicare Wellness Visit. I appreciate your ongoing commitment to your health goals. Please review the following plan we discussed and let me know if I can assist you in the future.   Referrals/Orders/Follow-Ups/Clinician Recommendations: none  This is a list of the screening recommended for you and due dates:  Health Maintenance  Topic Date Due   COVID-19 Vaccine (3 - Pfizer risk series) 08/22/2019   Complete foot exam   11/29/2022   Zoster (Shingles) Vaccine (1 of 2) 04/04/2023*   Flu Shot  08/05/2023*   Pneumonia Vaccine (2 of 2 - PCV) 08/16/2023*   Hemoglobin A1C  02/15/2023   Eye exam for diabetics  02/27/2023   Yearly kidney function blood test for diabetes  08/16/2023   Yearly kidney health urinalysis for diabetes  08/16/2023   Medicare Annual Wellness Visit  01/02/2024   Mammogram  02/29/2024   DTaP/Tdap/Td vaccine (2 - Td or Tdap) 05/03/2029   Colon Cancer Screening  03/10/2030   DEXA scan (bone density measurement)  Completed   Hepatitis C Screening  Completed   HPV Vaccine  Aged Out  *Topic was postponed. The date shown is not the original due date.    Advanced directives: (Declined) Advance directive discussed with you today. Even though you declined this today, please call our office should you change your mind, and we can give you the proper paperwork for you to fill out.  Next Medicare Annual Wellness Visit scheduled for next year: No, office will schedule  Insert Preventive Care attachment 1Insert FALL PREVENTION attachment if needed

## 2023-01-02 NOTE — Progress Notes (Signed)
Subjective:   Teresa Newton is a 67 y.o. female who presents for Medicare Annual (Subsequent) preventive examination.  Visit Complete: In person    Review of Systems     Cardiac Risk Factors include: advanced age (>48men, >8 women);diabetes mellitus;hypertension;obesity (BMI >30kg/m2)     Objective:    Today's Vitals   01/02/23 0912  BP: 118/62  Pulse: 65  Temp: 98 F (36.7 C)  TempSrc: Oral  SpO2: 95%  Weight: 218 lb 9.6 oz (99.2 kg)  Height: 5\' 3"  (1.6 m)   Body mass index is 38.72 kg/m.     01/02/2023    9:20 AM 08/01/2022    9:01 AM 04/19/2022   10:19 AM 11/15/2021   12:19 PM 01/30/2018    1:30 PM 01/27/2018   12:01 PM 08/23/2015    2:16 PM  Advanced Directives  Does Patient Have a Medical Advance Directive? No No No No No No No  Would patient like information on creating a medical advance directive?  No - Patient declined   No - Patient declined No - Patient declined No - patient declined information    Current Medications (verified) Outpatient Encounter Medications as of 01/02/2023  Medication Sig   CALCIUM PO Take 650 mg by mouth daily.   Cholecalciferol (D3 5000) 125 MCG (5000 UT) capsule 5,000 IU every other day   COSENTYX 150 MG/ML SOSY Inject into the skin every 30 (thirty) days.   Cyanocobalamin (B-12) 500 MCG TABS 400- 500 mcg daily   ELDERBERRY PO Take 50 mg by mouth daily.   EPINEPHRINE 0.3 mg/0.3 mL IJ SOAJ injection INJECT AS DIRECTED FOR ANAPHYLAXIS SHOCK   hydrochlorothiazide (MICROZIDE) 12.5 MG capsule TAKE 1 CAPSULE BY MOUTH EVERY DAY   lisinopril (ZESTRIL) 5 MG tablet TAKE 1 TABLET (5 MG TOTAL) BY MOUTH DAILY.   Magnesium 250 MG TABS Take 1 tablet by mouth daily.   POTASSIUM PO Take 99 mg by mouth daily.   Prenatal Vit-Fe Fumarate-FA (PRENATAL PO) Take 2 tablets by mouth daily.   rosuvastatin (CRESTOR) 5 MG tablet Take 1 tablet (5 mg total) by mouth daily.   aspirin 81 MG tablet Take 81 mg by mouth daily. (Patient not taking: Reported on  01/02/2023)   Continuous Blood Gluc Receiver (DEXCOM G7 RECEIVER) DEVI USE TO CHECK BLOOD SUGARS. (Patient not taking: Reported on 01/02/2023)   Continuous Blood Gluc Sensor (DEXCOM G7 SENSOR) MISC USE TO CHECK BLOOD SUGARS E11.65 (Patient not taking: Reported on 01/02/2023)   Ginger 500 MG CAPS Take 2 capsules by mouth daily. (Patient not taking: Reported on 01/02/2023)   Turmeric 500 MG CAPS Take 2 tablets by mouth daily. (Patient not taking: Reported on 01/02/2023)   No facility-administered encounter medications on file as of 01/02/2023.    Allergies (verified) Fish allergy, Milk-related compounds, and Penicillins   History: Past Medical History:  Diagnosis Date   ADD (attention deficit disorder)    Back pain    Bilateral edema of lower extremity    Bimalleolar ankle fracture    right   Constipation    Diabetes (HCC)    Edema of both lower legs    Food allergy    GERD (gastroesophageal reflux disease)    Hyperlipidemia    Hypertension    Joint pain    Lactose intolerance    LTBI (latent tuberculosis infection)    Other fatigue    Plaque psoriasis    Pre-diabetes    Shortness of breath on exertion  Sleep apnea    gastric bypass, lost 70lbs and no longer needs CPAP   Stomach ulcer    Past Surgical History:  Procedure Laterality Date   COLONOSCOPY     FOOT SURGERY Right    GASTRIC BYPASS     ORIF ANKLE FRACTURE Right 01/30/2018   Procedure: OPEN REDUCTION INTERNAL FIXATION (ORIF) right ankle trimalleolar fracture;  Surgeon: [name], MD;  Location: [facility];  Service: Orthopedics;  Laterality: Right;   TUBAL LIGATION     Family History  Problem Relation Age of Onset   Hypertension Mother    Diabetes Mother    Tuberculosis Mother    Heart Problems Mother    Dementia Mother    Stroke Mother    Alcoholism Father    Ovarian cancer Sister    Psoriasis Brother    Breast cancer Paternal Aunt 2   Alcoholism Other    Social History    Socioeconomic History   Marital status: Widowed    Spouse name: Not on file   Number of children: 4   Years of education: Not on file   Highest education level: Not on file  Occupational History   Occupation: Midwife    Comment: Retired, 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  Tobacco Use   Smoking status: Former    Current packs/day: 0.00    Average packs/day: 1 pack/day for 4.0 years (4.0 ttl pk-yrs)    Types: Cigarettes    Start date: 05/07/1972    Quit date: 05/06/1976    Years since quitting: 46.6   Smokeless tobacco: Never  Vaping Use   Vaping status: Never Used  Substance and Sexual Activity   Alcohol use: No    Alcohol/week: 0.0 standard drinks of alcohol   Drug use: No   Sexual activity: Not Currently    Partners: Male  Other Topics Concern   Not on file  Social History Narrative   Not on file   Social Determinants of Health   Financial Resource Strain: Low Risk  (01/02/2023)   Overall Financial Resource Strain (CARDIA)    Difficulty of Paying Living Expenses: Not hard at all  Food Insecurity: No Food Insecurity (01/02/2023)   Hunger Vital Sign    Worried About Running Out of Food in the Last Year: Never true    Ran Out of Food in the Last Year: Never true  Transportation Needs: No Transportation Needs (01/02/2023)   PRAPARE - Administrator, Civil Service (Medical): No    Lack of Transportation (Non-Medical): No  Physical Activity: Sufficiently Active (01/02/2023)   Exercise Vital Sign    Days of Exercise per Week: 3 days    Minutes of Exercise per Session: 50 min  Stress: No Stress Concern Present (01/02/2023)   [employer] of Occupational Health - Occupational Stress Questionnaire    Feeling of Stress : Not at all  Social Connections: Moderately Isolated (01/02/2023)   Social Connection and Isolation Panel [NHANES]    Frequency of Communication with Friends and Family: More than three times a week    Frequency of Social  Gatherings with Friends and Family: More than three times a week    Attends Religious Services: More than 4 times per year    Active Member of [employer] or Organizations: No    Attends Banker Meetings: Never    Marital Status: Widowed    Tobacco Counseling Counseling given: Not Answered   Clinical Intake:  Pre-visit preparation completed: Yes  Pain : No/denies pain     Nutritional Status: BMI > 30  Obese Nutritional Risks: None Diabetes: Yes CBG done?: No Did pt. bring in CBG monitor from home?: No  How often do you need to have someone help you when you read instructions, pamphlets, or other written materials from your doctor or pharmacy?: 1 - Never  Interpreter Needed?: No  Information entered by :: NAllen LPN   Activities of Daily Living    01/02/2023    9:14 AM  In your present state of health, do you have any difficulty performing the following activities:  Hearing? 0  Vision? 0  Difficulty concentrating or making decisions? 0  Walking or climbing stairs? 0  Dressing or bathing? 0  Doing errands, shopping? 0  Preparing Food and eating ? N  Using the Toilet? N  In the past six months, have you accidently leaked urine? N  Do you have problems with loss of bowel control? N  Managing your Medications? N  Managing your Finances? N  Housekeeping or managing your Housekeeping? N    Patient Care Team: Dorothyann Peng, MD as PCP - General (Internal Medicine)  Indicate any recent Medical Services you may have received from other than [facility] providers in the past year (date may be approximate).     Assessment:   This is a routine wellness examination for Ok.  Hearing/Vision screen Hearing Screening - Comments:: Denies hearing issues Vision Screening - Comments:: Regular eye exams, [facility]  Dietary issues and exercise activities discussed:     Goals Addressed             This Visit's Progress    Patient Stated       01/02/2023,  wants to build up exercise and lose weight       Depression Screen    01/02/2023    9:21 AM 08/16/2022    9:05 AM 08/01/2022    9:01 AM 11/28/2021   10:53 AM 11/15/2021   12:20 PM 07/06/2021   10:17 AM 06/14/2021   10:06 AM  PHQ 2/9 Scores  PHQ - 2 Score 0 0 0 0 0 1 5  PHQ- 9 Score 0 0     15    Fall Risk    01/02/2023    9:21 AM 08/16/2022    9:04 AM 08/01/2022    9:01 AM 11/28/2021   10:53 AM 11/15/2021   12:20 PM  Fall Risk   Falls in the past year? 0 0 0 0 0  Number falls in past yr: 0   0 0  Injury with Fall? 0 0  0 0  Risk for fall due to : Medication side effect No Fall Risks   Medication side effect  Follow up Falls prevention discussed;Falls evaluation completed Falls evaluation completed  Falls evaluation completed Falls evaluation completed;Education provided;Falls prevention discussed    MEDICARE RISK AT HOME: Medicare Risk at Home Any stairs in or around the home?: No If so, are there any without handrails?: No Home free of loose throw rugs in walkways, pet beds, electrical cords, etc?: Yes Adequate lighting in your home to reduce risk of falls?: Yes Life alert?: No Use of a cane, walker or w/c?: No Grab bars in the bathroom?: Yes Shower chair or bench in shower?: Yes Elevated toilet seat or a handicapped toilet?: Yes  TIMED UP AND GO:  Was the test performed?  Yes  Length of time to ambulate 10 feet:  5 sec Gait steady and fast without use of assistive device    Cognitive Function:        01/02/2023    9:22 AM 11/15/2021   12:21 PM  6CIT Screen  What Year? 0 points 0 points  What month? 0 points 0 points  What time? 0 points 0 points  Count back from 20 0 points 0 points  Months in reverse 0 points 0 points  Repeat phrase 6 points 4 points  Total Score 6 points 4 points    Immunizations Immunization History  Administered Date(s) Administered   PFIZER(Purple Top)SARS-COV-2 Vaccination 07/03/2019, 07/25/2019   Pneumococcal Polysaccharide-23  11/21/2020   Tdap 05/04/2019    TDAP status: Up to date  Flu Vaccine status: Declined, Education has been provided regarding the importance of this vaccine but patient still declined. Advised may receive this vaccine at local pharmacy or Health Dept. Aware to provide a copy of the vaccination record if obtained from local pharmacy or Health Dept. Verbalized acceptance and understanding.  Pneumococcal vaccine status: Up to date  Covid-19 vaccine status: Information provided on how to obtain vaccines.   Qualifies for Shingles Vaccine? Yes   Zostavax completed No   Shingrix Completed?: No.    Education has been provided regarding the importance of this vaccine. Patient has been advised to call insurance company to determine out of pocket expense if they have not yet received this vaccine. Advised may also receive vaccine at local pharmacy or Health Dept. Verbalized acceptance and understanding.  Screening Tests Health Maintenance  Topic Date Due   COVID-19 Vaccine (3 - Pfizer risk series) 08/22/2019   FOOT EXAM  11/29/2022   Zoster Vaccines- Shingrix (1 of 2) 04/04/2023 (Originally 11/29/1974)   INFLUENZA VACCINE  08/05/2023 (Originally 12/06/2022)   Pneumonia Vaccine 47+ Years old (2 of 2 - PCV) 08/16/2023 (Originally 11/21/2021)   HEMOGLOBIN A1C  02/15/2023   OPHTHALMOLOGY EXAM  02/27/2023   Diabetic kidney evaluation - eGFR measurement  08/16/2023   Diabetic kidney evaluation - Urine ACR  08/16/2023   Medicare Annual Wellness (AWV)  01/02/2024   MAMMOGRAM  02/29/2024   DTaP/Tdap/Td (2 - Td or Tdap) 05/03/2029   Colonoscopy  03/10/2030   DEXA SCAN  Completed   Hepatitis C Screening  Completed   HPV VACCINES  Aged Out    Health Maintenance  Health Maintenance Due  Topic Date Due   COVID-19 Vaccine (3 - Pfizer risk series) 08/22/2019   FOOT EXAM  11/29/2022    Colorectal cancer screening: Type of screening: Colonoscopy. Completed 03/10/2020. Repeat every 10 years  Mammogram  status: Completed 02/26/2022. Repeat every year  Bone Density status: Completed 07/12/2021.   Lung Cancer Screening: (Low Dose CT Chest recommended if Age 27-80 years, 20 pack-year currently smoking OR have quit w/in 15years.) does not qualify.   Lung Cancer Screening Referral: no  Additional Screening:  Hepatitis C Screening: does qualify; Completed 05/23/2012  Vision Screening: Recommended annual ophthalmology exams for early detection of glaucoma and other disorders of the eye. Is the patient up to date with their annual eye exam?  Yes  Who is the provider or what is the name of the office in which the patient attends annual eye exams? [facility] If pt is not established with a provider, would they like to be referred to a provider to establish care? No .   Dental Screening: Recommended annual dental exams for proper oral hygiene  Diabetic Foot Exam: Diabetic Foot Exam: Overdue, Pt  has been advised about the importance in completing this exam. Pt is scheduled for diabetic foot exam on next appointment .  Community Resource Referral / Chronic Care Management: CRR required this visit?  No   CCM required this visit?  No     Plan:     I have personally reviewed and noted the following in the patient's chart:   Medical and social history Use of alcohol, tobacco or illicit drugs  Current medications and supplements including opioid prescriptions. Patient is not currently taking opioid prescriptions. Functional ability and status Nutritional status Physical activity Advanced directives List of other physicians Hospitalizations, surgeries, and ER visits in previous 12 months Vitals Screenings to include cognitive, depression, and falls Referrals and appointments  In addition, I have reviewed and discussed with patient certain preventive protocols, quality metrics, and best practice recommendations. A written personalized care plan for preventive services as well as general  preventive health recommendations were provided to patient.     Barb Merino, LPN   [id number]/72/[id number]   After Visit Summary: in person  Nurse Notes: none

## 2023-01-02 NOTE — Progress Notes (Unsigned)
Chief Complaint:   OBESITY Teresa Newton (MR# [id number]) is a 67 y.o. female who presents for evaluation and treatment of obesity and related comorbidities. Current BMI is Body mass index is 37.91 kg/m. Teresa Newton has been struggling with her weight for many years and has been unsuccessful in either losing weight, maintaining weight loss, or reaching her healthy weight goal.  Teresa Newton is currently in the action stage of change and ready to dedicate time achieving and maintaining a healthier weight. Teresa Newton is interested in becoming our patient and working on intensive lifestyle modifications including (but not limited to) diet and exercise for weight loss.  Patient lives with her grown daughter who does the grocery shopping. She eats out daily. She regained weight after her husband passed 3 years ago. She snacks on salty foods. She is a stress/boredom eater. She has a history of RYGB with [facility] in 2013/01/30.  Teresa Newton's habits were reviewed today and are as follows: her desired weight loss is 64 lbs, she started gaining weight after steroids, her heaviest weight ever was 203 pounds, she is a picky eater and doesn't like to eat healthier foods, she has significant food cravings issues, she snacks frequently in the evenings, she skips meals frequently, and she struggles with emotional eating.  Depression Screen Teresa Newton's Food and Mood (modified PHQ-9) score was 7.  Subjective:   1. Other fatigue Janit admits to daytime somnolence and admits to waking up still tired. Patient has a history of symptoms of daytime fatigue, morning fatigue, and morning headache. Teresa Newton generally gets 5 hours of sleep per night, and states that she has nightime awakenings. Snoring is present. Apneic episodes are not present. Epworth Sleepiness Score is 7.  EKG normal sinus rhythm at 60 bpm; TWI inferior and anterolateral leads.  2. SOBOE (shortness of breath on exertion) Teresa Newton notes increasing  shortness of breath with exercising and seems to be worsening over time with weight gain. She notes getting out of breath sooner with activity than she used to. This has not gotten worse recently. Teresa Newton denies shortness of breath at rest or orthopnea.  3. Essential hypertension Patient's blood pressure is well controlled on lisinopril 5 mg once daily and hydrochlorothiazide 12.5 mg once daily. She denies headache or chest pain.   4. Other hyperlipidemia Patient is on rosuvastatin 5 mg once daily, and she has myalgias.  She denies AMI or CVA history.  5. Type 2 diabetes mellitus with other specified complication, unspecified whether long term insulin use (HCC) Patient had type 2 diabetes prior to gastric bypass.  She has a family history of type 2 diabetes mellitus.  She has tried Rybelsus but it did not work.  She never tried metformin.  She craves sweets more now and she is less active.  6. Weight gain status post gastric bypass Patient is status post gastric bypass in 01-30-13 at [facility].  Her highest weight was 240 pounds and lowest weight 180 pounds.  She maintained until 01/31/2020 when her husband died.  She was able to eat bigger portions.  She takes a multivitamin, B12, vitamin D, and calcium.  Assessment/Plan:   1. Other fatigue Norris does feel that her weight is causing her energy to be lower than it should be. Fatigue may be related to obesity, depression or many other causes. Labs will be ordered, and in the meanwhile, Teresa Newton will focus on self care including making healthy food choices, increasing physical activity and focusing on stress reduction.  -  EKG 12-Lead - VITAMIN D 25 Hydroxy (Vit-D Deficiency, Fractures) - TSH - T4, free - Folate - Comprehensive metabolic panel - Vitamin B12 - Prealbumin - Vitamin B1 - Ferritin - Iron and TIBC  2. SOBOE (shortness of breath on exertion) Teresa Newton does feel that she gets out of breath more easily that she used to when she exercises. Teresa Newton's  shortness of breath appears to be obesity related and exercise induced. She has agreed to work on weight loss and gradually increase exercise to treat her exercise induced shortness of breath. Will continue to monitor closely.  3. Essential hypertension Look for blood pressure improvements with weight loss.  4. Other hyperlipidemia Began prescribed dietary plan, which is low in saturated fat.  5. Type 2 diabetes mellitus with other specified complication, unspecified whether long term insulin use (HCC) We will check labs today.  Patient is to plan to use Ozempic to aid in type 2 diabetes mellitus and obesity.  - Insulin, random - Hemoglobin A1c  6. Weight gain status post gastric bypass Patient will begin 4-5 small meals per day each with lean protein.  7. Depression screening Teresa Newton had a positive depression screening. Depression is commonly associated with obesity and often results in emotional eating behaviors. We will monitor this closely and work on CBT to help improve the non-hunger eating patterns. Referral to Psychology may be required if no improvement is seen as she continues in our clinic.  8. BMI 37.0-37.9, adult  9. Generalized obesity, with starting BMI 37.9 Teresa Newton is currently in the action stage of change and her goal is to continue with weight loss efforts. I recommend Teresa Newton begin the structured treatment plan as follows:  She has agreed to the [employer].  100-calorie snack list was given.  Exercise goals: All adults should avoid inactivity. Some physical activity is better than none, and adults who participate in any amount of physical activity gain some health benefits.   Behavioral modification strategies: increasing lean protein intake, increasing vegetables, increasing water intake, decreasing liquid calories, decreasing eating out, no skipping meals, meal planning and cooking strategies, keeping healthy foods in the home, better snacking choices, and  planning for success.  She was informed of the importance of frequent follow-up visits to maximize her success with intensive lifestyle modifications for her multiple health conditions. She was informed we would discuss her lab results at her next visit unless there is a critical issue that needs to be addressed sooner. Teresa Newton agreed to keep her next visit at the agreed upon time to discuss these results.  Objective:   Blood pressure 129/76, pulse (!) 58, temperature 97.7 F (36.5 C), height 5\' 3"  (1.6 m), weight 214 lb (97.1 kg), SpO2 98%. Body mass index is 37.91 kg/m.  EKG: Normal sinus rhythm, rate 60 BPM.  Indirect Calorimeter completed today shows a VO2 of 164 and a REE of 1123.  Her calculated basal metabolic rate is 1027 thus her basal metabolic rate is worse than expected.  General: Cooperative, alert, well developed, in no acute distress. HEENT: Conjunctivae and lids unremarkable. Cardiovascular: Regular rhythm.  Lungs: Normal work of breathing. Neurologic: No focal deficits.   Lab Results  Component Value Date   CREATININE 0.81 08/16/2022   BUN 24 08/16/2022   NA 139 08/16/2022   K 4.0 08/16/2022   CL 97 08/16/2022   CO2 28 08/16/2022   Lab Results  Component Value Date   ALT 19 05/14/2022   AST 24 05/14/2022   ALKPHOS 92  05/14/2022   BILITOT 0.6 05/14/2022   Lab Results  Component Value Date   HGBA1C 6.3 (H) 08/16/2022   HGBA1C 6.2 (H) 05/14/2022   HGBA1C 6.2 (H) 02/08/2022   HGBA1C 6.1 (H) 11/28/2021   HGBA1C 6.0 (H) 07/06/2021   Lab Results  Component Value Date   INSULIN 6.2 06/14/2021   Lab Results  Component Value Date   TSH 0.673 06/14/2021   Lab Results  Component Value Date   CHOL 127 08/16/2022   HDL 64 08/16/2022   LDLCALC 50 08/16/2022   TRIG 57 08/16/2022   CHOLHDL 2.0 08/16/2022   Lab Results  Component Value Date   WBC 5.4 08/16/2022   HGB 12.9 08/16/2022   HCT 39.2 08/16/2022   MCV 91 08/16/2022   PLT 260 08/16/2022   No  results found for: "IRON", "TIBC", "FERRITIN"  Attestation Statements:   Reviewed by clinician on day of visit: allergies, medications, problem list, medical history, surgical history, family history, social history, and previous encounter notes.  Time spent on visit including pre-visit chart review and post-visit charting and care was 45 minutes.   Trude Mcburney, am acting as transcriptionist for Seymour Bars, DO.  I have reviewed the above documentation for accuracy and completeness, and I agree with the above. - ***

## 2023-01-06 LAB — FOLATE: Folate: >20 ng/mL (ref >3.0)

## 2023-01-06 LAB — COMPREHENSIVE METABOLIC PANEL
ALT: 29 IU/L (ref 0–32)
AST: 24 IU/L (ref 0–40)
Albumin: 4.4 g/dL (ref 3.9–4.9)
Alkaline Phosphatase: 101 IU/L (ref 44–121)
BUN/Creatinine Ratio: 20 (ref 12–28)
BUN: 18 mg/dL (ref 8–27)
Bilirubin Total: 0.9 mg/dL (ref 0.0–1.2)
CO2: 26 mmol/L (ref 20–29)
Calcium: 9.6 mg/dL (ref 8.7–10.3)
Chloride: 100 mmol/L (ref 96–106)
Creatinine, Ser: 0.89 mg/dL (ref 0.57–1.00)
Globulin, Total: 3 g/dL (ref 1.5–4.5)
Glucose: 100 mg/dL — ABNORMAL HIGH (ref 70–99)
Potassium: 4 mmol/L (ref 3.5–5.2)
Sodium: 143 mmol/L (ref 134–144)
Total Protein: 7.4 g/dL (ref 6.0–8.5)
eGFR: 71 mL/min/{1.73_m2} (ref >59)

## 2023-01-06 LAB — PREALBUMIN: PREALBUMIN: 18 mg/dL (ref 10–36)

## 2023-01-06 LAB — FERRITIN: Ferritin: 110 ng/mL (ref 15–150)

## 2023-01-06 LAB — VITAMIN B1: Thiamine: 152.6 nmol/L (ref 66.5–200.0)

## 2023-01-06 LAB — IRON AND TIBC
Iron Saturation: 31 % (ref 15–55)
Iron: 100 ug/dL (ref 27–139)
Total Iron Binding Capacity: 320 ug/dL (ref 250–450)
UIBC: 220 ug/dL (ref 118–369)

## 2023-01-06 LAB — VITAMIN D 25 HYDROXY (VIT D DEFICIENCY, FRACTURES): Vit D, 25-Hydroxy: 71.4 ng/mL (ref 30.0–100.0)

## 2023-01-06 LAB — VITAMIN B12: Vitamin B-12: >2000 pg/mL — ABNORMAL HIGH (ref 232–1245)

## 2023-01-06 LAB — HEMOGLOBIN A1C
Est. average glucose Bld gHb Est-mCnc: 126 mg/dL
Hgb A1c MFr Bld: 6 % — ABNORMAL HIGH (ref 4.8–5.6)

## 2023-01-06 LAB — TSH: TSH: 0.65 u[IU]/mL (ref 0.450–4.500)

## 2023-01-06 LAB — INSULIN, RANDOM: INSULIN: 7.8 u[IU]/mL (ref 2.6–24.9)

## 2023-01-06 LAB — T4, FREE: Free T4: 1.04 ng/dL (ref 0.82–1.77)

## 2023-01-16 ENCOUNTER — Other Ambulatory Visit: Payer: Self-pay | Admitting: Family Medicine

## 2023-01-16 ENCOUNTER — Encounter: Payer: Self-pay | Admitting: Family Medicine

## 2023-01-16 ENCOUNTER — Ambulatory Visit: Payer: Medicare PPO | Admitting: Family Medicine

## 2023-01-16 VITALS — BP 138/72 | HR 64 | Temp 98.1°F | Ht 63.0 in | Wt 216.0 lb

## 2023-01-16 DIAGNOSIS — R7303 Prediabetes: Secondary | ICD-10-CM | POA: Insufficient documentation

## 2023-01-16 DIAGNOSIS — R635 Abnormal weight gain: Secondary | ICD-10-CM

## 2023-01-16 DIAGNOSIS — Z6838 Body mass index (BMI) 38.0-38.9, adult: Secondary | ICD-10-CM

## 2023-01-16 DIAGNOSIS — E678 Other specified hyperalimentation: Secondary | ICD-10-CM | POA: Insufficient documentation

## 2023-01-16 DIAGNOSIS — Z9884 Bariatric surgery status: Secondary | ICD-10-CM

## 2023-01-16 DIAGNOSIS — R948 Abnormal results of function studies of other organs and systems: Secondary | ICD-10-CM | POA: Diagnosis not present

## 2023-01-16 MED ORDER — METFORMIN HCL ER (OSM) 500 MG PO TB24: 500.0000 mg | ORAL_TABLET | Freq: Every day | ORAL | 0 refills | Status: DC

## 2023-01-16 NOTE — Assessment & Plan Note (Signed)
Patient has struggled to see weight loss over the last 2 weeks of her prescribed dietary plan.  This is likely in part due to a low metabolic rate as seen on most recent indirect calorimetry.  Her metabolic rate has dropped about 300 cal/day in the last year.  This was likely due to inadequate caloric intake and poor nutrition as she was grieving the loss of her husband.  She has done a better job with eating on a schedule, incorporating lean protein with both meals and snacks.  She has been working on improving sleep to 7-8 hours at night and increasing walking frequency.  Continue to work on recommended lifestyle changes and plan to recheck IC in the next 6 months.

## 2023-01-16 NOTE — Assessment & Plan Note (Signed)
Reviewed lab results with patient status post gastric bypass surgery 10 years ago.  She is considering the Endo Stitch procedure in [place], [place] to give her improved satiety and volume restriction.  She is not a candidate for use of a GLP-1 receptor agonist due to lack of insurance coverage.  She has been working on increasing her intake of lean protein and eating on a schedule.  She has increased her intake of nonstarchy vegetables and water outside of mealtime.  Continue on plan for Endo Stitch procedure for the treatment of weight gain status post gastric bypass surgery.  Recommend tracking caloric intake aiming for 1100 cal/day which should include 90 g of protein daily.

## 2023-01-16 NOTE — Progress Notes (Signed)
Office: [phone]  /  Fax: [phone]  WEIGHT SUMMARY AND BIOMETRICS  Starting Date: 01/01/23  Starting Weight: 214lb   Weight Lost Since Last Visit: 0lb   Vitals Temp: 98.1 F (36.7 C) BP: 138/72 Pulse Rate: 64 SpO2: 99 %   Body Composition  Body Fat %: 47 % Fat Mass (lbs): 101.6 lbs Muscle Mass (lbs): 108.6 lbs Total Body Water (lbs): 78.4 lbs Visceral Fat Rating : 15   HPI  Chief Complaint: OBESITY  Teresa Newton is here to discuss her progress with her obesity treatment plan. She is on the the [facility] and states she is following her eating plan approximately 100 % of the time. She states she is exercising 30 minutes 3 times per week.   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 for [employer] procedure  Pharmacotherapy: none  PHYSICAL EXAM:  Blood pressure 138/72, pulse 64, temperature 98.1 F (36.7 C), height 5\' 3"  (1.6 m), weight 216 lb (98 kg), SpO2 99%. Body mass index is 38.26 kg/m.  General: She is overweight, cooperative, alert, well developed, and in no acute distress. PSYCH: Has normal mood, affect and thought process.   Lungs: Normal breathing effort, no conversational dyspnea.   ASSESSMENT AND PLAN  TREATMENT PLAN FOR OBESITY:  Recommended Dietary Goals  Teresa Newton is currently in the action stage of change. As such, her goal is to continue weight management plan. She has agreed to following a lower carbohydrate, vegetable and lean protein rich diet plan. - low carb dietary handout given -  recommend tracking on the MyFitnessPal ap aiming for 1100 cal/ day to include 90 g of protein daily  Behavioral Intervention  We discussed the following Behavioral Modification Strategies today: increasing lean protein intake, decreasing simple carbohydrates , increasing vegetables, increasing lower glycemic fruits, increasing water intake, work on meal planning and preparation, work on managing stress, creating time for self-care and relaxation measures, continue to practice mindfulness when eating, and planning for success.  Additional resources provided today: NA  Recommended Physical Activity Goals  Teresa Newton has been advised to work up to 150 minutes of moderate intensity aerobic activity a week and strengthening exercises 2-3 times per week for cardiovascular health, weight loss maintenance and preservation of muscle mass.   She has agreed to Increase the intensity, frequency or duration of strengthening exercises  and Increase the intensity, frequency or duration of aerobic exercises    Pharmacotherapy changes for the treatment of obesity: add metformin XR 500 mg with dinner daily  ASSOCIATED CONDITIONS ADDRESSED TODAY  Weight gain status post gastric bypass Assessment & Plan: Reviewed lab results with patient status post gastric bypass surgery 10 years ago.  She is considering the Endo Stitch procedure in [place], [place] to give her improved satiety and volume restriction.  She is not a candidate for use of a GLP-1 receptor agonist due to lack of insurance coverage.  She has been working on increasing her intake of lean protein and eating on a schedule.  She has increased her intake of nonstarchy vegetables and water outside of mealtime.  Continue on plan for Endo Stitch procedure for the treatment of weight gain status post gastric bypass surgery.  Recommend tracking caloric intake aiming for 1100 cal/day which should include 90 g of protein daily.   Prediabetes Assessment &  Plan: Lab Results  Component Value Date   HGBA1C 6.0 (H) 01/01/2023   Reviewed lab results with patient.  She remains in the prediabetic range.  She has not had any A1c's listed in the type II diabetic range as reviewed in her chart today.  She has never used metformin.  She briefly used Rybelsus off label in the past and failed to see any improvements in her weight or improved satiety.  She has been working on reducing her intake of starches and sweets.  She has been more physically active as well, starting to gain lean muscle mass.  Begin metformin XR 500 mg once daily with dinner.  Recheck labs in the next 8 weeks.  Orders: -     metFORMIN HCl ER (OSM); Take 1 tablet (500 mg total) by mouth daily with supper.  Dispense: 30 tablet; Refill: 0  Morbid obesity (HCC) with starting BMI 37  BMI 38.0-38.9,adult  Excessive vitamin B12 intake Assessment & Plan: Reviewed lab results with patient.  Her vitamin B12 level remains elevated over 2000.  She had been taking vitamin B12 1000 mcg once daily but has moved this to every other day.  She denies feeling ill.  She denies paresthesias, brain fog or extreme fatigue.  Continue vitamin B12 1000 mcg every other day and recheck level in the next 6 months.   Low basal metabolic rate Assessment & Plan: Patient has struggled to see weight loss over the last 2 weeks of her prescribed dietary plan.  This is likely in part due to a low metabolic rate as seen on most recent indirect calorimetry.  Her metabolic rate has dropped about 300 cal/day in the last year.  This was likely due to inadequate caloric intake and poor nutrition as she was grieving the loss of her husband.  She has done a better job with eating on a schedule, incorporating lean protein with both meals and snacks.  She has been working on improving sleep to 7-8 hours at night and increasing walking frequency.  Continue to work on recommended lifestyle changes and plan to recheck IC in the  next 6 months.       She was informed of the importance of frequent follow up visits to maximize her success with intensive lifestyle modifications for her multiple health conditions.   ATTESTASTION STATEMENTS:  Reviewed by clinician on day of visit: allergies, medications, problem list, medical history, surgical history, family history, social history, and previous encounter notes pertinent to obesity diagnosis.   I have personally spent 30 minutes total time today in preparation, patient care, nutritional counseling and documentation for this visit, including the following: review of clinical lab tests; review of medical tests/procedures/services.      [name], DO DABFM, DABOM [facility] [address] [place], [place] [address] [phone]

## 2023-01-16 NOTE — Assessment & Plan Note (Signed)
Lab Results  Component Value Date   HGBA1C 6.0 (H) 01/01/2023   Reviewed lab results with patient.  She remains in the prediabetic range.  She has not had any A1c's listed in the type II diabetic range as reviewed in her chart today.  She has never used metformin.  She briefly used Rybelsus off label in the past and failed to see any improvements in her weight or improved satiety.  She has been working on reducing her intake of starches and sweets.  She has been more physically active as well, starting to gain lean muscle mass.  Begin metformin XR 500 mg once daily with dinner.  Recheck labs in the next 8 weeks.

## 2023-01-16 NOTE — Assessment & Plan Note (Signed)
Reviewed lab results with patient.  Her vitamin B12 level remains elevated over 2000.  She had been taking vitamin B12 1000 mcg once daily but has moved this to every other day.  She denies feeling ill.  She denies paresthesias, brain fog or extreme fatigue.  Continue vitamin B12 1000 mcg every other day and recheck level in the next 6 months.

## 2023-02-03 ENCOUNTER — Other Ambulatory Visit: Payer: Self-pay | Admitting: Internal Medicine

## 2023-02-06 ENCOUNTER — Ambulatory Visit: Payer: Medicare PPO | Admitting: Family Medicine

## 2023-02-06 ENCOUNTER — Encounter: Payer: Self-pay | Admitting: Family Medicine

## 2023-02-06 VITALS — BP 137/83 | HR 80 | Temp 98.3°F | Ht 63.0 in | Wt 216.0 lb

## 2023-02-06 DIAGNOSIS — E66812 Obesity, class 2: Secondary | ICD-10-CM | POA: Diagnosis not present

## 2023-02-06 DIAGNOSIS — E1169 Type 2 diabetes mellitus with other specified complication: Secondary | ICD-10-CM | POA: Diagnosis not present

## 2023-02-06 DIAGNOSIS — Z6838 Body mass index (BMI) 38.0-38.9, adult: Secondary | ICD-10-CM | POA: Diagnosis not present

## 2023-02-06 DIAGNOSIS — Z7985 Long-term (current) use of injectable non-insulin antidiabetic drugs: Secondary | ICD-10-CM | POA: Diagnosis not present

## 2023-02-06 DIAGNOSIS — Z9884 Bariatric surgery status: Secondary | ICD-10-CM

## 2023-02-06 MED ORDER — METFORMIN HCL 500 MG PO TABS: 500.0000 mg | ORAL_TABLET | Freq: Every day | ORAL | 0 refills | Status: DC

## 2023-02-06 MED ORDER — SEMAGLUTIDE(0.25 OR 0.5MG/DOS) 2 MG/3ML ~~LOC~~ SOPN: 0.2500 mg | PEN_INJECTOR | SUBCUTANEOUS | 0 refills | Status: DC

## 2023-02-06 NOTE — Progress Notes (Signed)
Office: [phone]  /  Fax: [phone]  WEIGHT SUMMARY AND BIOMETRICS  Starting Date: 01/01/23  Starting Weight: 214lb   Weight Lost Since Last Visit: 0lb   Vitals Temp: 98.3 F (36.8 C) BP: 137/83 Pulse Rate: 80 SpO2: 98 %   Body Composition  Body Fat %: 47.4 % Fat Mass (lbs): 102.8 lbs Muscle Mass (lbs): 108.2 lbs Total Body Water (lbs): 79 lbs Visceral Fat Rating : 15   HPI  Chief Complaint: OBESITY  Teresa Newton is here to discuss her progress with her obesity treatment plan. She is on the the [facility] and states she is following her eating plan approximately 80-90 % of the time. She states she is walking for 30 minutes 3 times per week.  Interval History:  Since last office visit she is down 0 lb She has a net weight gain of 2 lb in one month with a low metabolic rate She is looking at home exercise equipment She has a good support system at home She has not started metformin She is doing well planning dinners at home She rarely eats out  Pharmacotherapy: none  PHYSICAL EXAM:  Blood pressure 137/83, pulse 80, temperature 98.3 F (36.8 C), height 5\' 3"  (1.6 m), weight 216 lb (98 kg), SpO2 98%. Body mass index is 38.26 kg/m.  General: She is overweight, cooperative, alert, well developed, and in no acute distress. PSYCH: Has normal mood, affect and thought process.   Lungs: Normal breathing effort, no conversational dyspnea.   ASSESSMENT AND PLAN  TREATMENT PLAN FOR OBESITY:  Recommended Dietary Goals  Teresa Newton is currently in the action stage of change. As such, her goal is to continue weight management plan. She has agreed to keeping a food journal and adhering to recommended goals of 1100 calories and 90 g of  protein.  Behavioral Intervention  We discussed the following Behavioral Modification Strategies today: increasing lean protein intake, decreasing simple carbohydrates , increasing vegetables, increasing lower glycemic fruits,  avoiding skipping meals, increasing water intake, work on meal planning and preparation, keeping healthy foods at home, continue to practice mindfulness when eating, and planning for success.  Additional resources provided today: NA  Recommended Physical Activity Goals  Varie has been advised to work up to 150 minutes of moderate intensity aerobic activity a week and strengthening exercises 2-3 times per week for cardiovascular health, weight loss maintenance and preservation of muscle mass.   She has agreed to [employer] strengthening exercises with a goal of 2-3 sessions a week   Pharmacotherapy changes for the treatment of obesity:  begin Ozempic 0.25 mg once weekly injection x 4 weeks  ASSOCIATED CONDITIONS ADDRESSED TODAY  Type 2 diabetes mellitus with other specified complication, unspecified whether long term insulin use (HCC) Assessment & Plan: Pt did not start on metformin and has a hx of diet controlled type II diabetes Her A1c has improved since her gastric bypass She is working on a low sugar/ low starch diet with room to ramp up walking and weight training  We discussed use of Ozempic and she is an excellent candidate Reviewed [email or web address] website and instructed on pen use Patient denies a personal or family history of pancreatitis, medullary thyroid carcinoma or multiple endocrine neoplasia type II. Recommend reviewing pen training video online. Use Ozempic along with lifestyle changes  Orders: -     Semaglutide(0.25 or 0.5MG /DOS); Inject 0.25 mg into the skin once a week.  Dispense: 3 mL; Refill: 0  Class 2 severe obesity due to  excess calories with serious comorbidity and body mass index (BMI) of 38.0 to 38.9 in adult (HCC)  Weight gain status post gastric bypass Assessment & Plan: She has struggled with weight regain s/p RYGB surgery and the biggest barrier to weight loss was found on her IC with a  low metabolic rate.  She has improved her sleep at night, increased her  lean protein intake with meals and has plans to ramp up exercise frequency to boost metabolic rate.  Recommend logging to ensure she is meeting her calorie and protein target using the MyfitnessPal ap and a goal of 1100 cal/ day and 90 g of protein daily.  Anticipate improvements using Ozempic for type II DM.  Consider EndoStitch with surgeon in [place] if not improving over the next 3 mos       She was informed of the importance of frequent follow up visits to maximize her success with intensive lifestyle modifications for her multiple health conditions.   ATTESTASTION STATEMENTS:  Reviewed by clinician on day of visit: allergies, medications, problem list, medical history, surgical history, family history, social history, and previous encounter notes pertinent to obesity diagnosis.   I have personally spent 30 minutes total time today in preparation, patient care, nutritional counseling and documentation for this visit, including the following: review of clinical lab tests; review of medical tests/procedures/services.      [name], DO DABFM, DABOM [facility] [address] [place], [place] [address] [phone]

## 2023-02-06 NOTE — Assessment & Plan Note (Signed)
She has struggled with weight regain s/p RYGB surgery and the biggest barrier to weight loss was found on her IC with a  low metabolic rate.  She has improved her sleep at night, increased her lean protein intake with meals and has plans to ramp up exercise frequency to boost metabolic rate.  Recommend logging to ensure she is meeting her calorie and protein target using the MyfitnessPal ap and a goal of 1100 cal/ day and 90 g of protein daily.  Anticipate improvements using Ozempic for type II DM.  Consider EndoStitch with surgeon in [place] if not improving over the next 3 mos

## 2023-02-06 NOTE — Assessment & Plan Note (Signed)
Pt did not start on metformin and has a hx of diet controlled type II diabetes Her A1c has improved since her gastric bypass She is working on a low sugar/ low starch diet with room to ramp up walking and weight training  We discussed use of Ozempic and she is an excellent candidate Reviewed [email or web address] website and instructed on pen use Patient denies a personal or family history of pancreatitis, medullary thyroid carcinoma or multiple endocrine neoplasia type II. Recommend reviewing pen training video online. Use Ozempic along with lifestyle changes

## 2023-02-08 ENCOUNTER — Emergency Department (HOSPITAL_BASED_OUTPATIENT_CLINIC_OR_DEPARTMENT_OTHER)
Admission: EM | Admit: 2023-02-08 | Discharge: 2023-02-08 | Disposition: A | Discharge status: Home / Self Care | Payer: Medicare PPO | Attending: Emergency Medicine | Admitting: Emergency Medicine

## 2023-02-08 ENCOUNTER — Other Ambulatory Visit: Payer: Self-pay

## 2023-02-08 ENCOUNTER — Emergency Department (HOSPITAL_BASED_OUTPATIENT_CLINIC_OR_DEPARTMENT_OTHER): Payer: Medicare PPO

## 2023-02-08 ENCOUNTER — Encounter (HOSPITAL_BASED_OUTPATIENT_CLINIC_OR_DEPARTMENT_OTHER): Payer: Self-pay

## 2023-02-08 DIAGNOSIS — Z7982 Long term (current) use of aspirin: Secondary | ICD-10-CM | POA: Diagnosis not present

## 2023-02-08 DIAGNOSIS — M1812 Unilateral primary osteoarthritis of first carpometacarpal joint, left hand: Secondary | ICD-10-CM | POA: Diagnosis not present

## 2023-02-08 DIAGNOSIS — S62663A Nondisplaced fracture of distal phalanx of left middle finger, initial encounter for closed fracture: Secondary | ICD-10-CM | POA: Diagnosis not present

## 2023-02-08 DIAGNOSIS — S60142A Contusion of left ring finger with damage to nail, initial encounter: Secondary | ICD-10-CM | POA: Diagnosis not present

## 2023-02-08 DIAGNOSIS — W231XXA Caught, crushed, jammed, or pinched between stationary objects, initial encounter: Secondary | ICD-10-CM | POA: Diagnosis not present

## 2023-02-08 DIAGNOSIS — S6010XA Contusion of unspecified finger with damage to nail, initial encounter: Secondary | ICD-10-CM

## 2023-02-08 DIAGNOSIS — S6992XA Unspecified injury of left wrist, hand and finger(s), initial encounter: Secondary | ICD-10-CM | POA: Diagnosis present

## 2023-02-08 DIAGNOSIS — S60132A Contusion of left middle finger with damage to nail, initial encounter: Secondary | ICD-10-CM | POA: Diagnosis not present

## 2023-02-08 DIAGNOSIS — S62665A Nondisplaced fracture of distal phalanx of left ring finger, initial encounter for closed fracture: Secondary | ICD-10-CM | POA: Insufficient documentation

## 2023-02-08 MED ORDER — IBUPROFEN 800 MG PO TABS
800.0000 mg | ORAL_TABLET | Freq: Once | ORAL | Status: AC | PRN
  Administered 2023-02-08: 800 mg via ORAL
  Filled 2023-02-08: qty 1

## 2023-02-08 NOTE — ED Triage Notes (Signed)
Patient here POV from Home.  Endorses injuring her left third/fourth digit on her garage door. This occurred less than 0.5 hours ago.   NAD Noted during Triage. A&Ox4. GCS 15. Ambulatory.

## 2023-02-08 NOTE — ED Notes (Signed)
In to discharge patient, no patient in room.

## 2023-02-08 NOTE — ED Provider Notes (Addendum)
[facility] Provider Note   CSN: [id number] Arrival date & time: 02/08/23  2725     History  Chief Complaint  Patient presents with   Finger Injury    TASMIN EXANTUS is a 67 y.o. female who presents with pain to her left third and fourth digits after they were closed under garage door this morning.  Denies any lacerations to the area, just reports pain mostly in the distal aspect of these fingers.  Denies any difficulty with range of motion.   HPI     Home Medications Prior to Admission medications   Medication Sig Start Date End Date Taking? Authorizing Provider  aspirin 81 MG tablet Take 81 mg by mouth daily.    [provider]  CALCIUM PO Take 650 mg by mouth daily.    [provider]  Cholecalciferol (D3 5000) 125 MCG (5000 UT) capsule 5,000 IU every other day 06/28/21   Thomasene Lot, DO  Continuous Blood Gluc Receiver (DEXCOM G7 RECEIVER) DEVI USE TO CHECK BLOOD SUGARS. 12/25/21   Dorothyann Peng, MD  Continuous Blood Gluc Sensor (DEXCOM G7 SENSOR) MISC USE TO CHECK BLOOD SUGARS E11.65 12/25/21   Dorothyann Peng, MD  COSENTYX 150 MG/ML SOSY Inject into the skin every 30 (thirty) days.    [provider]  Cyanocobalamin (B-12) 500 MCG TABS 400- 500 mcg daily 06/28/21   Opalski, Deborah, DO  ELDERBERRY PO Take 50 mg by mouth daily.    [provider]  EPINEPHRINE 0.3 mg/0.3 mL IJ SOAJ injection INJECT AS DIRECTED FOR ANAPHYLAXIS SHOCK 03/18/18   Dorothyann Peng, MD  Ginger 500 MG CAPS Take 2 capsules by mouth daily.    [provider]  hydrochlorothiazide (MICROZIDE) 12.5 MG capsule TAKE 1 CAPSULE BY MOUTH EVERY DAY 02/06/23   Dorothyann Peng, MD  lisinopril (ZESTRIL) 5 MG tablet TAKE 1 TABLET (5 MG TOTAL) BY MOUTH DAILY. 11/26/22   Dorothyann Peng, MD  Magnesium 250 MG TABS Take 1 tablet by mouth daily.    [provider]  POTASSIUM PO Take 99 mg by mouth daily.    [provider]  Prenatal Vit-Fe Fumarate-FA (PRENATAL PO) Take 2 tablets by mouth daily.    [provider]  rosuvastatin (CRESTOR) 5 MG tablet Take 1 tablet (5 mg total) by mouth daily. 05/14/22   Dorothyann Peng, MD  Semaglutide,0.25 or 0.5MG /DOS, 2 MG/3ML SOPN Inject 0.25 mg into the skin once a week. 02/06/23   Bowen, Scot Jun, DO  Turmeric 500 MG CAPS Take 2 tablets by mouth daily.    [provider]      Allergies    Fish allergy, Milk-related compounds, and Penicillins    Review of Systems   Review of Systems  Musculoskeletal:        Left 3rd and 4th digit pain    Physical Exam Updated Vital Signs BP (!) 176/100 (BP Location: Right Arm)   Pulse 71   Temp 97.8 F (36.6 C) (Temporal)   Resp 18   Ht 5\' 3"  (1.6 m)   Wt 98 kg   SpO2 98%   BMI 38.26 kg/m  Physical Exam Vitals and nursing note reviewed.  Constitutional:      Appearance: Normal appearance.  HENT:     Head: Atraumatic.  Cardiovascular:     Comments: Radial pulses 2+ bilaterally Cap refill of the left third and fourth digits under 2 seconds Pulmonary:     Effort: Pulmonary effort  is normal.  Musculoskeletal:     Comments: Left hand:  No obvious deformity to the left third and fourth digits, there is a subungual hematoma of the left third and fourth digit nailbed, approximately 20% of the nailbed Able to flex and extend at the third and fourth MCP, PIP, DIP No tenderness to palpation of the 1st through 5th MCPs, proximal or middle phalanx.  Tender to palpation of the third and fourth distal phalanx.  No tenderness palpation of the first and second or fifth distal phalanx Diminished sensation in the tip of the third digit. Intact sensation of the first, second, fourth, fifth digit  Neurological:     General: No focal deficit present.     Mental Status: She is alert.  Psychiatric:        Mood and Affect: Mood normal.        Behavior: Behavior normal.     ED Results / Procedures /  Treatments   Labs (all labs ordered are listed, but only abnormal results are displayed) Labs Reviewed - No data to display  EKG None  Radiology DG Hand Complete Left  Result Date: 02/08/2023 CLINICAL DATA:  Third and fourth digit injury EXAM: LEFT HAND - COMPLETE 3 VIEW COMPARISON:  None Available. FINDINGS: Subtle nondisplaced fractures of the distal tuft of the distal phalanx of third and fourth digits. No additional fracture or dislocation. Overall preserved joint spaces. Only slight degenerative changes of the first carpometacarpal joint. IMPRESSION: Subtle fractures of the distal tuft of the distal phalanx of the third and fourth digits. Electronically Signed   By: Karen Kays M.D.   On: 02/08/2023 09:48    Procedures Procedures    Medications Ordered in ED Medications  ibuprofen (ADVIL) tablet 800 mg (800 mg Oral Given 02/08/23 0919)    ED Course/ Medical Decision Making/ A&P                                 Medical Decision Making Amount and/or Complexity of Data Reviewed Radiology: ordered.  Risk Prescription drug management.   67 y.o. female with pertinent past medical history of type 2 diabetes, hypertension presents to the ED for concern of left third and fourth digit pain after closing her garage door on them today  Differential diagnosis includes but is not limited to bone contusion, soft tissue injury, fracture, dislocation  ED Course:  Patient had a crush injury to left hand this morning from her garage door, now with left third and fourth digit pain.  Pain is mostly on the distal aspect of the fingers.  Patient has full range of motion of the left third and fourth digits.  Intact sensation of the left fourth digit, slightly diminished sensation in the distal tip of the left third digit. Cap refill of the left 3rd and 4th digit less than 2 seconds. There is a subungual hematoma to the left third and fourth digits, however, this is approximately 15-20% of the  nailbed, does not need to be drained at this time.  X-rays revealed distal tuft fracture of both the left third and fourth digits.  Patient was placed in finger splint.  I discussed that I could send some oxycodone with her for pain, patient states she does not want anything more for pain and will continue with ibuprofen at home. States she avoids Tylenol. Patient given 800 mg ibuprofen for pain   Impression: Closed nondisplaced distal tuft  fracture of the left third distal phalanx Closed nondisplaced distal tuft fracture of the left fourth distal phalanx Subungual hematoma  Disposition:  The patient was discharged home with instructions to follow-up with orthopedics in 3 weeks.  Wear finger splint provided for the next 3 weeks, may take off for hygiene.  Ibuprofen for pain. Return precautions given.   Imaging Studies ordered: I ordered imaging studies including x-ray left hand I independently visualized the imaging with scope of interpretation limited to determining acute life threatening conditions related to emergency care. Imaging showed nondisplaced distal tuft fracture of the left third and fourth digits I agree with the radiologist interpretation            Final Clinical Impression(s) / ED Diagnoses Final diagnoses:  Closed nondisplaced fracture of distal phalanx of left middle finger, initial encounter  Closed nondisplaced fracture of distal phalanx of left ring finger, initial encounter  Subungual hematoma of digit of hand, initial encounter    Rx / DC Orders ED Discharge Orders     None         Arabella Merles, PA-C 02/08/23 1039    Arabella Merles, PA-C 02/08/23 1040    Benjiman Core, MD 02/08/23 1452

## 2023-02-08 NOTE — Discharge Instructions (Addendum)
You have a fracture at the tip of your left middle finger and left ring finger.  Please wear the splint provided for the next 3 weeks.  You may take the splint off to perform hygiene like showering and washing your hands. Otherwise, you need to wear the splint to ensure proper healing of the fracture.  Follow-up with the orthopedic office listed below, or another orthopedic office of your preference,  in 3 weeks for repeat x-rays and reevaluation.  You may use up to 800mg  ibuprofen every 8 hours as needed for pain.  Do not exceed 2.4g of ibuprofen per day.  You were given ibuprofen here today, your next dose will be at 6 PM today.   Return the ER for any uncontrolled pain, complete numbness of the finger, any other new or concerning symptoms.

## 2023-02-20 ENCOUNTER — Other Ambulatory Visit: Payer: Self-pay | Admitting: Internal Medicine

## 2023-02-20 DIAGNOSIS — H40013 Open angle with borderline findings, low risk, bilateral: Secondary | ICD-10-CM | POA: Diagnosis not present

## 2023-02-20 DIAGNOSIS — E119 Type 2 diabetes mellitus without complications: Secondary | ICD-10-CM | POA: Diagnosis not present

## 2023-02-20 DIAGNOSIS — H5213 Myopia, bilateral: Secondary | ICD-10-CM | POA: Diagnosis not present

## 2023-02-20 DIAGNOSIS — H25013 Cortical age-related cataract, bilateral: Secondary | ICD-10-CM | POA: Diagnosis not present

## 2023-02-20 LAB — HM DIABETES EYE EXAM

## 2023-03-03 ENCOUNTER — Other Ambulatory Visit: Payer: Self-pay | Admitting: Family Medicine

## 2023-03-03 DIAGNOSIS — E1169 Type 2 diabetes mellitus with other specified complication: Secondary | ICD-10-CM

## 2023-03-05 ENCOUNTER — Ambulatory Visit: Payer: Medicare PPO | Admitting: Family Medicine

## 2023-03-05 ENCOUNTER — Encounter: Payer: Self-pay | Admitting: Family Medicine

## 2023-03-05 VITALS — BP 133/70 | HR 80 | Temp 98.2°F | Ht 63.0 in | Wt 212.0 lb

## 2023-03-05 DIAGNOSIS — Z7985 Long-term (current) use of injectable non-insulin antidiabetic drugs: Secondary | ICD-10-CM | POA: Diagnosis not present

## 2023-03-05 DIAGNOSIS — E1169 Type 2 diabetes mellitus with other specified complication: Secondary | ICD-10-CM | POA: Diagnosis not present

## 2023-03-05 DIAGNOSIS — R948 Abnormal results of function studies of other organs and systems: Secondary | ICD-10-CM

## 2023-03-05 DIAGNOSIS — Z9884 Bariatric surgery status: Secondary | ICD-10-CM | POA: Diagnosis not present

## 2023-03-05 DIAGNOSIS — E66812 Obesity, class 2: Secondary | ICD-10-CM | POA: Diagnosis not present

## 2023-03-05 DIAGNOSIS — Z6837 Body mass index (BMI) 37.0-37.9, adult: Secondary | ICD-10-CM | POA: Diagnosis not present

## 2023-03-05 MED ORDER — SEMAGLUTIDE(0.25 OR 0.5MG/DOS) 2 MG/3ML ~~LOC~~ SOPN
0.5000 mg | PEN_INJECTOR | SUBCUTANEOUS | 1 refills | Status: DC
Start: 2023-03-05 — End: 2023-04-02

## 2023-03-05 NOTE — Assessment & Plan Note (Signed)
Improving She is focused on improving food choices, watching portion sizes and practicing mindful eating.  She has done well with dietary logging.  Reviewed logging goals together.  Ozempic for T2DM is helping with satiety and cravings.  Continue current supplements Hydrate well with water 30 min outside of meals

## 2023-03-05 NOTE — Assessment & Plan Note (Signed)
Lab Results  Component Value Date   HGBA1C 6.0 (H) 01/01/2023   Doing well with new start Ozempic without adverse SE.  Doing well with dietary logging, limiting added sugars and starches.  Has plans to increase exercise time.  Reviewed plan of care on AVS with patient.  Increase Ozempic to 0.5 mg weekly injection Aim for 85-95 g of protein intake daily to minimize muscle loss

## 2023-03-05 NOTE — Progress Notes (Signed)
Office: [phone]  /  Fax: [phone]  WEIGHT SUMMARY AND BIOMETRICS  Starting Date: 01/01/23  Starting Weight: 214lb   Weight Lost Since Last Visit: 4lb   Vitals Temp: 98.2 F (36.8 C) BP: 133/70 Pulse Rate: 80 SpO2: 98 %   Body Composition  Body Fat %: 46.4 % Fat Mass (lbs): 98.6 lbs Muscle Mass (lbs): 108.2 lbs Total Body Water (lbs): 76.6 lbs Visceral Fat Rating : 15     HPI  Chief Complaint: OBESITY  Teresa Newton is here to discuss her progress with her obesity treatment plan. She is on the the [facility] and states she is following her eating plan approximately 85 % of the time. She states she is exercising 30 minutes 4 times per week.   Interval History:  Since last office visit she is down 4 lb This gives her a net weight loss of 2 lb in the past 2 mos of medically supervised weight management She maintained her muscle mass and lost 4.2 lb of body fat in the past 3 weeks She has been moving since her last visit, walking more Her daughter has been supportive Occasional sugar cravings Struggling to get in enough water and plans to ramp up exercise Appetite has improved on Ozempic 0.25 mg weekly x 4 weeks  Pharmacotherapy: Ozempic 0.25 mg weekly, x 4 weeks  PHYSICAL EXAM:  Blood pressure 133/70, pulse 80, temperature 98.2 F (36.8 C), height 5\' 3"  (1.6 m), weight 212 lb (96.2 kg), SpO2 98%. Body mass index is 37.55 kg/m.  General: She is overweight, cooperative, alert, well developed, and in no acute distress. PSYCH: Has normal mood, affect and thought process.   Lungs: Normal breathing effort, no conversational dyspnea.   ASSESSMENT AND PLAN  TREATMENT PLAN FOR OBESITY:  Recommended Dietary Goals  Teresa Newton is currently in the action stage of change. As such, her goal is to continue weight management plan. She has agreed to the [employer].  Behavioral Intervention  We discussed the following Behavioral Modification Strategies  today: increasing lean protein intake to established goals, work on meal planning and preparation, work on tracking and journaling calories using tracking application, keeping healthy foods at home, planning for success, better snacking choices, and continue to work on maintaining a reduced calorie state, getting the recommended amount of protein, incorporating whole foods, making healthy choices, staying well hydrated and practicing mindfulness when eating..  Additional resources provided today: NA  Recommended Physical Activity Goals  Teresa Newton has been advised to work up to 150 minutes of moderate intensity aerobic activity a week and strengthening exercises 2-3 times per week for cardiovascular health, weight loss maintenance and preservation of muscle mass.   She has agreed to Think about enjoyable ways to increase daily physical activity and overcoming barriers to exercise and Increase physical activity in their day and reduce sedentary time (increase NEAT).  Pharmacotherapy changes for the treatment of obesity: increase Ozempic to 0.5 mg weekly injection  ASSOCIATED CONDITIONS ADDRESSED TODAY  Type 2 diabetes mellitus with other specified complication, unspecified whether long term insulin use (HCC) Assessment & Plan: Lab Results  Component Value Date   HGBA1C 6.0 (H) 01/01/2023   Doing well with new start Ozempic without adverse SE.  Doing well with dietary logging, limiting added sugars and starches.  Has plans to increase exercise time.  Reviewed plan of care on AVS with patient.  Increase Ozempic to 0.5 mg weekly injection Aim for 85-95 g of protein intake daily to minimize muscle loss  Orders: -     Semaglutide(0.25 or 0.5MG /DOS); Inject 0.5 mg into the skin once a week.  Dispense: 3 mL; Refill: 1  Class 2 severe obesity due to excess calories with serious comorbidity and body mass index (BMI) of 37.0 to 37.9 in adult (HCC)  Weight gain status post gastric bypass Assessment &  Plan: Improving She is focused on improving food choices, watching portion sizes and practicing mindful eating.  She has done well with dietary logging.  Reviewed logging goals together.  Ozempic for T2DM is helping with satiety and cravings.  Continue current supplements Hydrate well with water 30 min outside of meals    Low basal metabolic rate Assessment & Plan: She is working on factors to increase metabolic rate: Hitting daily protein and kcal target with logging Aim for 8 hrs of quality sleep at night Increasing walking time to 150 min/ wk  Plan to add in resistance training Reviewed options to add this at home       She was informed of the importance of frequent follow up visits to maximize her success with intensive lifestyle modifications for her multiple health conditions.   ATTESTASTION STATEMENTS:  Reviewed by clinician on day of visit: allergies, medications, problem list, medical history, surgical history, family history, social history, and previous encounter notes pertinent to obesity diagnosis.   I have personally spent 30 minutes total time today in preparation, patient care, nutritional counseling and documentation for this visit, including the following: review of clinical lab tests; review of medical tests/procedures/services.      [name], DO DABFM, DABOM [facility] [address] [place], [place] [address] [phone]

## 2023-03-05 NOTE — Patient Instructions (Signed)
Keep total daily protein target 85-95 g/ day  Increase Ozempic to 0.5 mg weekly Call if any problems or questions  Increase water > 64 oz/ day Drink 30 min outside of meals  Keep up the walking Aim for 30+ min /day Think about plan to increase weight training  Remember your fruits and veggies

## 2023-03-05 NOTE — Assessment & Plan Note (Signed)
She is working on factors to increase metabolic rate: Hitting daily protein and kcal target with logging Aim for 8 hrs of quality sleep at night Increasing walking time to 150 min/ wk  Plan to add in resistance training Reviewed options to add this at home

## 2023-03-11 DIAGNOSIS — L400 Psoriasis vulgaris: Secondary | ICD-10-CM | POA: Diagnosis not present

## 2023-03-11 DIAGNOSIS — Z79899 Other long term (current) drug therapy: Secondary | ICD-10-CM | POA: Diagnosis not present

## 2023-04-02 ENCOUNTER — Encounter: Payer: Self-pay | Admitting: Family Medicine

## 2023-04-02 ENCOUNTER — Ambulatory Visit: Payer: Medicare PPO | Admitting: Family Medicine

## 2023-04-02 VITALS — BP 127/73 | HR 71 | Temp 98.0°F | Ht 63.0 in | Wt 208.0 lb

## 2023-04-02 DIAGNOSIS — E1169 Type 2 diabetes mellitus with other specified complication: Secondary | ICD-10-CM | POA: Diagnosis not present

## 2023-04-02 DIAGNOSIS — Z9884 Bariatric surgery status: Secondary | ICD-10-CM | POA: Diagnosis not present

## 2023-04-02 DIAGNOSIS — Z7985 Long-term (current) use of injectable non-insulin antidiabetic drugs: Secondary | ICD-10-CM | POA: Diagnosis not present

## 2023-04-02 DIAGNOSIS — Z6836 Body mass index (BMI) 36.0-36.9, adult: Secondary | ICD-10-CM

## 2023-04-02 DIAGNOSIS — R948 Abnormal results of function studies of other organs and systems: Secondary | ICD-10-CM | POA: Diagnosis not present

## 2023-04-02 DIAGNOSIS — E66812 Obesity, class 2: Secondary | ICD-10-CM

## 2023-04-02 MED ORDER — SEMAGLUTIDE (1 MG/DOSE) 4 MG/3ML ~~LOC~~ SOPN
1.0000 mg | PEN_INJECTOR | SUBCUTANEOUS | 1 refills | Status: DC
Start: 2023-04-02 — End: 2023-04-25

## 2023-04-02 NOTE — Addendum Note (Signed)
Addended by: [name] on: 04/02/2023 10:47 AM   Modules accepted: Level of Service

## 2023-04-02 NOTE — Assessment & Plan Note (Signed)
She is doing quite well on Ozempic 0.5 mg once weekly injection.  She is not monitoring her blood sugar readings.  She is working on a low sugar/low starch diet.  She has been getting in more physical activity with plans to increase her intensity of both cardio and resistance training.  She denies nausea, constipation, diarrhea or heartburn from Ozempic.  She has been enjoying the added satiety.  Increase Ozempic to 1 mg once weekly injection.  Continue working on [employer] with a low sugar/low starch diet.

## 2023-04-02 NOTE — Assessment & Plan Note (Addendum)
Patient's initial resting energy expenditure August 2024 was low at 1123.  She has been inconsistent with tracking her daily calorie intake.  She does feel like she is making better choices and is conscious of her eating schedule and portion sizes.  Ozempic has helped her to restrict portion sizes and excess snacks.  Continue working on ways to increase low metabolic rate including increasing walking time, adding in weight training, getting an adequate protein intake, sleeping well at night.  Plan to recheck basal metabolic rate April 2025

## 2023-04-02 NOTE — Progress Notes (Signed)
Office: [phone]  /  Fax: [phone]  WEIGHT SUMMARY AND BIOMETRICS  Starting Date: 01/01/23  Starting Weight: 214lb   Weight Lost Since Last Visit: 4lb   Vitals Temp: 98 F (36.7 C) BP: 127/73 Pulse Rate: 71 SpO2: 98 %   Body Composition  Body Fat %: 45.5 % Fat Mass (lbs): 95 lbs Muscle Mass (lbs): 108 lbs Total Body Water (lbs): 73.4 lbs Visceral Fat Rating : 14    HPI  Chief Complaint: OBESITY  Teresa Newton is here to discuss her progress with her obesity treatment plan. She is on the the [facility] and states she is following her eating plan approximately 100 % of the time. She states she is exercising 30-45 minutes 6 times per week.   Interval History:  Since last office visit she is down 4 lb She has a net weight loss of 6 pounds in 3 months She is down 0.2 pounds of muscle mass and down 3.6 pounds of body fat since her last visit 4 weeks ago She is holding off on the [facility] in [place], [place] until Feb She is liking the improved satiety on Ozempic 0.5 mg (increased 4 weeks ago) Reports a good support system at home with her daughter She is moving around more She has kettlebells and bands at home, currently not using them  Pharmacotherapy: Ozempic 0.5 mg once weekly injection for type 2 diabetes  PHYSICAL EXAM:  Blood pressure 127/73, pulse 71, temperature 98 F (36.7 C), height 5\' 3"  (1.6 m), weight 208 lb (94.3 kg), SpO2 98%. Body mass index is 36.85 kg/m.  General: She is overweight, cooperative, alert, well developed, and in no acute distress. PSYCH: Has normal mood, affect and thought process.   Lungs: Normal breathing effort, no conversational dyspnea.  ASSESSMENT AND PLAN  TREATMENT PLAN FOR OBESITY:  Recommended Dietary Goals  Tequlia is currently in the action stage of change. As such, her goal is to continue weight management plan. She has agreed to keeping a food journal and adhering to recommended goals of 1100 calories and 90 g  of protein.  Behavioral Intervention  We discussed the following Behavioral Modification Strategies today: increasing lean protein intake to established goals, work on meal planning and preparation, work on tracking and journaling calories using tracking application, avoiding temptations and identifying enticing environmental cues, continue to work on implementation of reduced calorie nutritional plan, celebration eating strategies, and continue to work on maintaining a reduced calorie state, getting the recommended amount of protein, incorporating whole foods, making healthy choices, staying well hydrated and practicing mindfulness when eating..  Additional resources provided today: NA  Recommended Physical Activity Goals  Vivianna has been advised to work up to 150 minutes of moderate intensity aerobic activity a week and strengthening exercises 2-3 times per week for cardiovascular health, weight loss maintenance and preservation of muscle mass.   She has agreed to [employer] strengthening exercises with a goal of 2-3 sessions a week   Pharmacotherapy changes for the treatment of obesity: Increase Ozempic to 1 mg once weekly injection  ASSOCIATED CONDITIONS ADDRESSED TODAY  Type 2 diabetes mellitus with other specified complication, unspecified whether long term insulin use (HCC) Assessment & Plan: She is doing quite well on Ozempic 0.5 mg once weekly injection.  She is not monitoring her blood sugar readings.  She is working on a low sugar/low starch diet.  She has been getting in more physical activity with plans to increase her intensity of both cardio and resistance training.  She denies nausea, constipation, diarrhea or heartburn from Ozempic.  She has been enjoying the added satiety.  Increase Ozempic to 1 mg once weekly injection.  Continue working on [employer] with a low sugar/low starch diet.  Orders: -     Semaglutide (1 MG/DOSE); Inject 1 mg as directed once a week.  Dispense: 3  mL; Refill: 1  Class 2 severe obesity due to excess calories with serious comorbidity and body mass index (BMI) of 36.0 to 36.9 in adult (HCC)  Weight gain status post gastric bypass Assessment & Plan: Improving.  Patient notes compliance taking all of her vitamins as directed.  She is doing a better job of eating 4-5 smaller meals per day with a focus on lean protein and fiber.  She has been inconsistent with dietary logging and we discussed this is one of her target goals.  Her muscle loss has been minimal and she does plan on adding in some weight training at home to minimize muscle loss.  She is feeling better volume control at mealtime from her previous gastric bypass surgery done in 2014.  Continue 4-5 small meals per day each including lean protein.  Add in some fruits and vegetables for added fiber.  Hydrate well with water outside of mealtime.  Begin tracking daily caloric intake with a target goal of 1100/day including 90 g of protein.   Low basal metabolic rate Assessment & Plan: Patient's initial resting energy expenditure August 2024 was low at 1123.  She has been inconsistent with tracking her daily calorie intake.  She does feel like she is making better choices and is conscious of her eating schedule and portion sizes.  Ozempic has helped her to restrict portion sizes and excess snacks.  Continue working on ways to increase low metabolic rate including increasing walking time, adding in weight training, getting an adequate protein intake, sleeping well at night.  Plan to recheck basal metabolic rate April 2025       She was informed of the importance of frequent follow up visits to maximize her success with intensive lifestyle modifications for her multiple health conditions.   ATTESTASTION STATEMENTS:  Reviewed by clinician on day of visit: allergies, medications, problem list, medical history, surgical history, family history, social history, and previous encounter notes  pertinent to obesity diagnosis.   I have personally spent 30 minutes total time today in preparation, patient care, nutritional counseling and documentation for this visit, including the following: review of clinical lab tests; review of medical tests/procedures/services.      [name], DO DABFM, DABOM [facility] [address] [place], [place] [address] [phone]

## 2023-04-02 NOTE — Assessment & Plan Note (Signed)
Improving.  Patient notes compliance taking all of her vitamins as directed.  She is doing a better job of eating 4-5 smaller meals per day with a focus on lean protein and fiber.  She has been inconsistent with dietary logging and we discussed this is one of her target goals.  Her muscle loss has been minimal and she does plan on adding in some weight training at home to minimize muscle loss.  She is feeling better volume control at mealtime from her previous gastric bypass surgery done in 2014.  Continue 4-5 small meals per day each including lean protein.  Add in some fruits and vegetables for added fiber.  Hydrate well with water outside of mealtime.  Begin tracking daily caloric intake with a target goal of 1100/day including 90 g of protein.

## 2023-04-15 ENCOUNTER — Telehealth: Payer: Self-pay | Admitting: *Deleted

## 2023-04-15 ENCOUNTER — Other Ambulatory Visit: Payer: Self-pay | Admitting: Internal Medicine

## 2023-04-15 DIAGNOSIS — Z1231 Encounter for screening mammogram for malignant neoplasm of breast: Secondary | ICD-10-CM

## 2023-04-15 NOTE — Telephone Encounter (Signed)
Contacted regarding PREP Class referral. Left voice message to return call for more information. 

## 2023-04-25 ENCOUNTER — Encounter: Payer: Self-pay | Admitting: Family Medicine

## 2023-04-25 ENCOUNTER — Ambulatory Visit (INDEPENDENT_AMBULATORY_CARE_PROVIDER_SITE_OTHER): Payer: Medicare PPO | Admitting: Family Medicine

## 2023-04-25 VITALS — BP 111/61 | HR 65 | Temp 98.3°F | Ht 63.0 in | Wt 209.0 lb

## 2023-04-25 DIAGNOSIS — Z9884 Bariatric surgery status: Secondary | ICD-10-CM

## 2023-04-25 DIAGNOSIS — E66812 Obesity, class 2: Secondary | ICD-10-CM

## 2023-04-25 DIAGNOSIS — Z6837 Body mass index (BMI) 37.0-37.9, adult: Secondary | ICD-10-CM

## 2023-04-25 DIAGNOSIS — R948 Abnormal results of function studies of other organs and systems: Secondary | ICD-10-CM | POA: Diagnosis not present

## 2023-04-25 DIAGNOSIS — E1169 Type 2 diabetes mellitus with other specified complication: Secondary | ICD-10-CM

## 2023-04-25 DIAGNOSIS — R635 Abnormal weight gain: Secondary | ICD-10-CM | POA: Diagnosis not present

## 2023-04-25 DIAGNOSIS — Z7985 Long-term (current) use of injectable non-insulin antidiabetic drugs: Secondary | ICD-10-CM

## 2023-04-25 MED ORDER — SEMAGLUTIDE (1 MG/DOSE) 4 MG/3ML ~~LOC~~ SOPN: 1.0000 mg | PEN_INJECTOR | SUBCUTANEOUS | 1 refills | Status: DC

## 2023-04-25 NOTE — Patient Instructions (Signed)
Try to log daily calorie intake on the MyFitnessPal or Lose It ap (on APP store)  Aim for 1000-1200 cal /day This should include ~90 of protein intake daily Do not add in your exercise  Add in 30 min of walking or other form of exercise 4-5 days/ wk Think about options to add in weight training (gym or home, free weights, body weight training, bands, yoga or pilates)  Continue Ozempic 1 mg weekly

## 2023-04-25 NOTE — Assessment & Plan Note (Signed)
Slowly improving Seeing inches lost Has a good amount of food volume control at meals with little food noise Taking a MVI daily Has increased lean protein intake Has room for more consistent exercise to include both cardio and resistance training  Consider endostitch procedure as she has previously discussed with bariatric surgery if she has not lost 10% TBW (21 lb) by early Spring

## 2023-04-25 NOTE — Assessment & Plan Note (Signed)
Doing well off of metformin and on Ozempic, 3 weeks into the 1 mg dose with adequate satiety and without GI side effects Denies vision changes, nausea, constipation or heartburn Cravings for sweets are minimal  Continue Ozempic 1 mg weekly Continue to work on a low sugar diet with lean protein and fiber at meals

## 2023-04-25 NOTE — Progress Notes (Signed)
Office: [phone]  /  Fax: [phone]  WEIGHT SUMMARY AND BIOMETRICS  Starting Date: 01/01/23  Starting Weight: 214lb   Weight Lost Since Last Visit: 0lb   Vitals Temp: 98.3 F (36.8 C) BP: 111/61 Pulse Rate: 65 SpO2: 98 %   Body Composition  Body Fat %: 46.1 % Fat Mass (lbs): 96.4 lbs Muscle Mass (lbs): 106.8 lbs Total Body Water (lbs): 74.6 lbs Visceral Fat Rating : 15    HPI  Chief Complaint: OBESITY  Teresa Newton is here to discuss her progress with her obesity treatment plan. She is on the keeping a food journal and adhering to recommended goals of 1100 calories and 90 protein and states she is following her eating plan approximately 80 % of the time. She states she is walking a few times a week.   Interval History:  Since last office visit she is up 1 lb She has been walking more (nothing formal, no weight training) She is losing inches She has net down 5 lb in the past 4 mos of medically supervised weight management She did move up on her Ozempic to 1 mg 3 weeks ago and doesn't feel much different Denies GI side effects She did stop metformin She is sleeping well at night Denies high stress levels She is s/p RYGB surgery in 2014  Pharmacotherapy: Ozempic 1 mg weekly  PHYSICAL EXAM:  Blood pressure 111/61, pulse 65, temperature 98.3 F (36.8 C), height 5\' 3"  (1.6 m), weight 209 lb (94.8 kg), SpO2 98%. Body mass index is 37.02 kg/m.  General: She is overweight, cooperative, alert, well developed, and in no acute distress. PSYCH: Has normal mood, affect and thought process.   Lungs: Normal breathing effort, no conversational dyspnea.   ASSESSMENT AND PLAN  TREATMENT PLAN FOR OBESITY:  Recommended Dietary Goals  Teresa Newton is currently in the action stage of change. As such, her goal is to continue weight management plan. She has agreed to the [employer] and the Vegetarian Plan. - copy of both plans given - will have her track calorie intake  to make sure she is hitting target goal of 1000- 1200 cal/ day to include 85-95 g of protein intake daily  Behavioral Intervention  We discussed the following Behavioral Modification Strategies today: increasing lean protein intake to established goals, increasing vegetables, avoiding skipping meals, increasing water intake , work on tracking and journaling calories using tracking application, keeping healthy foods at home, avoiding temptations and identifying enticing environmental cues, planning for success, and continue to work on maintaining a reduced calorie state, getting the recommended amount of protein, incorporating whole foods, making healthy choices, staying well hydrated and practicing mindfulness when eating..  Additional resources provided today: NA  Recommended Physical Activity Goals  Teresa Newton has been advised to work up to 150 minutes of moderate intensity aerobic activity a week and strengthening exercises 2-3 times per week for cardiovascular health, weight loss maintenance and preservation of muscle mass.   She has agreed to Start aerobic activity with a goal of 150 minutes a week at moderate intensity.  - ramp up exercise to include more consistently with walking and a plan to ramp up resistance training too  Pharmacotherapy changes for the treatment of obesity: none  ASSOCIATED CONDITIONS ADDRESSED TODAY  Low basal metabolic rate Assessment & Plan: Patient's low metabolic rate is a likely factor in her slower than expected weight loss over the past 4 mos She has lost 2.3% TBW in 4 mos of medically supervised weight management  IC revealed a BMR of 1123 Though she is mindful of her food choices and 'eating healthy', she may not be hitting her target goals for calories and protein intake She is doing well with sleep and has been trying to get in more protein  Increase walking time to 30 min 4-5 days/ wk Track caloric intake with goals discussed above   Type 2 diabetes  mellitus with other specified complication, unspecified whether long term insulin use (HCC) Assessment & Plan: Doing well off of metformin and on Ozempic, 3 weeks into the 1 mg dose with adequate satiety and without GI side effects Denies vision changes, nausea, constipation or heartburn Cravings for sweets are minimal  Continue Ozempic 1 mg weekly Continue to work on a low sugar diet with lean protein and fiber at meals  Orders: -     Semaglutide (1 MG/DOSE); Inject 1 mg as directed once a week.  Dispense: 3 mL; Refill: 1  Class 2 severe obesity due to excess calories with serious comorbidity and body mass index (BMI) of 37.0 to 37.9 in adult (HCC)  Weight gain status post gastric bypass Assessment & Plan: Slowly improving Seeing inches lost Has a good amount of food volume control at meals with little food noise Taking a MVI daily Has increased lean protein intake Has room for more consistent exercise to include both cardio and resistance training  Consider endostitch procedure as she has previously discussed with bariatric surgery if she has not lost 10% TBW (21 lb) by early Spring       She was informed of the importance of frequent follow up visits to maximize her success with intensive lifestyle modifications for her multiple health conditions.   ATTESTASTION STATEMENTS:  Reviewed by clinician on day of visit: allergies, medications, problem list, medical history, surgical history, family history, social history, and previous encounter notes pertinent to obesity diagnosis.   I have personally spent 30 minutes total time today in preparation, patient care, nutritional counseling and documentation for this visit, including the following: review of clinical lab tests; review of medical tests/procedures/services.      [name], DO DABFM, DABOM [facility] [address] [place], [place] [address] [phone]

## 2023-04-25 NOTE — Assessment & Plan Note (Signed)
Patient's low metabolic rate is a likely factor in her slower than expected weight loss over the past 4 mos She has lost 2.3% TBW in 4 mos of medically supervised weight management IC revealed a BMR of 1123 Though she is mindful of her food choices and 'eating healthy', she may not be hitting her target goals for calories and protein intake She is doing well with sleep and has been trying to get in more protein  Increase walking time to 30 min 4-5 days/ wk Track caloric intake with goals discussed above

## 2023-05-02 ENCOUNTER — Ambulatory Visit: Payer: Medicare PPO | Admitting: Family Medicine

## 2023-05-09 ENCOUNTER — Ambulatory Visit
Admission: RE | Admit: 2023-05-09 | Discharge: 2023-05-09 | Disposition: A | Discharge status: Home / Self Care | Payer: Medicare PPO | Source: Ambulatory Visit | Attending: Internal Medicine | Admitting: Internal Medicine

## 2023-05-09 DIAGNOSIS — Z1231 Encounter for screening mammogram for malignant neoplasm of breast: Secondary | ICD-10-CM

## 2023-05-15 ENCOUNTER — Telehealth: Payer: Self-pay | Admitting: *Deleted

## 2023-05-15 NOTE — Telephone Encounter (Signed)
 Left voice message regarding February 2025 PREP class schedule. Asked for return call if interested in program.

## 2023-05-22 ENCOUNTER — Encounter: Payer: Self-pay | Admitting: Internal Medicine

## 2023-05-22 ENCOUNTER — Ambulatory Visit: Payer: Medicare PPO | Admitting: Internal Medicine

## 2023-05-22 VITALS — BP 118/74 | HR 78 | Temp 97.8°F | Ht 63.0 in | Wt 211.2 lb

## 2023-05-22 DIAGNOSIS — Z79899 Other long term (current) drug therapy: Secondary | ICD-10-CM | POA: Diagnosis not present

## 2023-05-22 DIAGNOSIS — I10 Essential (primary) hypertension: Secondary | ICD-10-CM

## 2023-05-22 DIAGNOSIS — E66812 Obesity, class 2: Secondary | ICD-10-CM | POA: Diagnosis not present

## 2023-05-22 DIAGNOSIS — Z6837 Body mass index (BMI) 37.0-37.9, adult: Secondary | ICD-10-CM | POA: Diagnosis not present

## 2023-05-22 DIAGNOSIS — E785 Hyperlipidemia, unspecified: Secondary | ICD-10-CM

## 2023-05-22 DIAGNOSIS — E1169 Type 2 diabetes mellitus with other specified complication: Secondary | ICD-10-CM | POA: Diagnosis not present

## 2023-05-22 DIAGNOSIS — Z0000 Encounter for general adult medical examination without abnormal findings: Secondary | ICD-10-CM | POA: Diagnosis not present

## 2023-05-22 DIAGNOSIS — G8929 Other chronic pain: Secondary | ICD-10-CM | POA: Diagnosis not present

## 2023-05-22 DIAGNOSIS — L989 Disorder of the skin and subcutaneous tissue, unspecified: Secondary | ICD-10-CM

## 2023-05-22 DIAGNOSIS — M25571 Pain in right ankle and joints of right foot: Secondary | ICD-10-CM

## 2023-05-22 LAB — POCT URINALYSIS DIPSTICK
Bilirubin, UA: NEGATIVE
Blood, UA: NEGATIVE
Glucose, UA: NEGATIVE
Ketones, UA: NEGATIVE
Leukocytes, UA: NEGATIVE
Nitrite, UA: NEGATIVE
Protein, UA: NEGATIVE
Spec Grav, UA: 1.025 (ref 1.010–1.025)
Urobilinogen, UA: 0.2 U/dL
pH, UA: 5 (ref 5.0–8.0)

## 2023-05-22 MED ORDER — ROSUVASTATIN CALCIUM 5 MG PO TABS
5.0000 mg | ORAL_TABLET | Freq: Every day | ORAL | 1 refills | Status: DC
Start: 2023-05-22 — End: 2023-11-28

## 2023-05-22 MED ORDER — EPINEPHRINE 0.3 MG/0.3ML IJ SOAJ: 0.3000 mg | INTRAMUSCULAR | 2 refills | Status: AC | PRN

## 2023-05-22 MED ORDER — HYDROCHLOROTHIAZIDE 12.5 MG PO CAPS
ORAL_CAPSULE | ORAL | 1 refills | Status: DC
Start: 2023-05-22 — End: 2023-11-28

## 2023-05-22 NOTE — Patient Instructions (Signed)

## 2023-05-22 NOTE — Assessment & Plan Note (Signed)

## 2023-05-22 NOTE — Progress Notes (Signed)
I,Teresa Newton, CMA,acting as a Neurosurgeon for Teresa Aliment, MD.,have documented all relevant documentation on the behalf of Teresa Aliment, MD,as directed by  Teresa Aliment, MD while in the presence of Teresa Aliment, MD.  Subjective:    Patient ID: Teresa Newton , female    DOB: April 10, 1956 , 68 y.o.   MRN: [id number]  Chief Complaint  Patient presents with   Annual Exam   Diabetes   Hypertension    HPI  Patient presents today for annual exam.  She goes to [facility] for Gyn exams. She reports compliance with meds. She denies having any headaches, chest pain and shortness of breath.  She has no specific questions or concerns today.   Diabetes She presents for her follow-up diabetic visit. She has type 2 diabetes mellitus. Her disease course has been stable. There are no hypoglycemic associated symptoms. Pertinent negatives for diabetes include no blurred vision, no chest pain, no foot paresthesias, no polydipsia and no polyphagia. There are no hypoglycemic complications. Risk factors for coronary artery disease include diabetes mellitus, dyslipidemia, hypertension, obesity, post-menopausal and sedentary lifestyle. She is following a generally healthy and diabetic diet. She never participates in exercise. Her breakfast blood glucose is taken between 7-8 am. Her breakfast blood glucose range is generally 90-110 mg/dl. An ACE inhibitor/angiotensin II receptor blocker is being taken. Eye exam is not current.  Hypertension This is a chronic problem. The current episode started more than 1 year ago. The problem has been gradually improving since onset. The problem is controlled. Pertinent negatives include no blurred vision, chest pain, malaise/fatigue, palpitations or shortness of breath. Risk factors for coronary artery disease include post-menopausal state, obesity, sedentary lifestyle, dyslipidemia and diabetes mellitus. The current treatment provides moderate improvement.     Past Medical  History:  Diagnosis Date   ADD (attention deficit disorder)    Back pain    Bilateral edema of lower extremity    Bimalleolar ankle fracture    right   Constipation    Diabetes (HCC)    Edema of both lower legs    Food allergy    GERD (gastroesophageal reflux disease)    Hyperlipidemia    Hypertension    Joint pain    Lactose intolerance    LTBI (latent tuberculosis infection)    Other fatigue    Plaque psoriasis    Pre-diabetes    Shortness of breath on exertion    Sleep apnea    gastric bypass, lost 70lbs and no longer needs CPAP   Stomach ulcer      Family History  Problem Relation Age of Onset   Hypertension Mother    Diabetes Mother    Tuberculosis Mother    Heart Problems Mother    Dementia Mother    Stroke Mother    Alcoholism Father    Ovarian cancer Sister    Psoriasis Brother    Breast cancer Paternal Aunt 90   Alcoholism Other      Current Outpatient Medications:    aspirin 81 MG tablet, Take 81 mg by mouth daily., Disp: , Rfl:    CALCIUM PO, Take 650 mg by mouth daily., Disp: , Rfl:    Cholecalciferol (D3 5000) 125 MCG (5000 UT) capsule, 5,000 IU every other day, Disp: , Rfl:    Continuous Blood Gluc Receiver (DEXCOM G7 RECEIVER) DEVI, USE TO CHECK BLOOD SUGARS., Disp: 3 each, Rfl: 1   Continuous Blood Gluc Sensor (DEXCOM G7 SENSOR) MISC, USE TO  CHECK BLOOD SUGARS E11.65, Disp: 3 each, Rfl: 1   COSENTYX 150 MG/ML SOSY, Inject into the skin every 30 (thirty) days., Disp: , Rfl:    Cyanocobalamin (B-12) 500 MCG TABS, 400- 500 mcg daily, Disp: , Rfl:    ELDERBERRY PO, Take 50 mg by mouth daily., Disp: , Rfl:    Ginger 500 MG CAPS, Take 2 capsules by mouth daily., Disp: , Rfl:    lisinopril (ZESTRIL) 5 MG tablet, TAKE 1 TABLET (5 MG TOTAL) BY MOUTH DAILY., Disp: 90 tablet, Rfl: 1   Magnesium 250 MG TABS, Take 1 tablet by mouth daily., Disp: , Rfl:    POTASSIUM PO, Take 99 mg by mouth daily., Disp: , Rfl:    Prenatal Vit-Fe Fumarate-FA (PRENATAL PO), Take  2 tablets by mouth daily., Disp: , Rfl:    Semaglutide, 1 MG/DOSE, 4 MG/3ML SOPN, Inject 1 mg as directed once a week., Disp: 3 mL, Rfl: 1   Turmeric 500 MG CAPS, Take 2 tablets by mouth daily., Disp: , Rfl:    EPINEPHrine 0.3 mg/0.3 mL IJ SOAJ injection, Inject 0.3 mg into the muscle as needed for anaphylaxis. INJECT AS DIRECTED FOR ANAPHYLAXIS SHOCK, Disp: 1 each, Rfl: 2   hydrochlorothiazide (MICROZIDE) 12.5 MG capsule, TAKE 1 CAPSULE BY MOUTH EVERY DAY, Disp: 90 capsule, Rfl: 1   rosuvastatin (CRESTOR) 5 MG tablet, Take 1 tablet (5 mg total) by mouth daily., Disp: 90 tablet, Rfl: 1   Allergies  Allergen Reactions   Fish Allergy Anaphylaxis    Cod fish    Milk-Related Compounds Anaphylaxis    Goat Milk ONLY    Penicillins Hives      The patient states she uses post menopausal status for birth control. No LMP recorded. Patient is postmenopausal.. Negative for Dysmenorrhea. Negative for: breast discharge, breast lump(s), breast pain and breast self exam. Associated symptoms include abnormal vaginal bleeding. Pertinent negatives include abnormal bleeding (hematology), anxiety, decreased libido, depression, difficulty falling sleep, dyspareunia, history of infertility, nocturia, sexual dysfunction, sleep disturbances, urinary incontinence, urinary urgency, vaginal discharge and vaginal itching. Diet regular.The patient states her exercise level is  intermittent.  . The patient's tobacco use is:  Social History   Tobacco Use  Smoking Status Former   Current packs/day: 0.00   Average packs/day: 1 pack/day for 4.0 years (4.0 ttl pk-yrs)   Types: Cigarettes   Start date: 05/07/1972   Quit date: 05/06/1976   Years since quitting: 47.0  Smokeless Tobacco Never  . She has been exposed to passive smoke. The patient's alcohol use is:  Social History   Substance and Sexual Activity  Alcohol Use No   Alcohol/week: 0.0 standard drinks of alcohol      Review of Systems  Constitutional:  Negative.  Negative for malaise/fatigue.  HENT: Negative.    Eyes: Negative.  Negative for blurred vision.  Respiratory: Negative.  Negative for shortness of breath.   Cardiovascular: Negative.  Negative for chest pain and palpitations.  Gastrointestinal: Negative.   Endocrine: Negative.  Negative for polydipsia and polyphagia.  Musculoskeletal:  Positive for arthralgias.       She c/o right ankle pain. Denies fall/trauma. There is some pain with ambulation. Has not seen specialist for this.   Neurological: Negative.      Today's Vitals   05/22/23 1408  BP: 118/74  Pulse: 78  Temp: 97.8 F (36.6 C)  SpO2: 98%  Weight: 211 lb 3.2 oz (95.8 kg)  Height: 5\' 3"  (1.6 m)   Body mass  index is 37.41 kg/m.  Wt Readings from Last 3 Encounters:  05/22/23 211 lb 3.2 oz (95.8 kg)  04/25/23 209 lb (94.8 kg)  04/02/23 208 lb (94.3 kg)     Objective:  Physical Exam Vitals and nursing note reviewed.  Constitutional:      Appearance: Normal appearance. She is obese.  HENT:     Head: Normocephalic and atraumatic.     Right Ear: Tympanic membrane, ear canal and external ear normal.     Left Ear: Tympanic membrane, ear canal and external ear normal.     Nose: Nose normal.     Mouth/Throat:     Mouth: Mucous membranes are moist.     Pharynx: Oropharynx is clear.  Eyes:     Extraocular Movements: Extraocular movements intact.     Conjunctiva/sclera: Conjunctivae normal.     Pupils: Pupils are equal, round, and reactive to light.  Cardiovascular:     Rate and Rhythm: Normal rate and regular rhythm.     Pulses: Normal pulses.          Dorsalis pedis pulses are 2+ on the right side and 2+ on the left side.     Heart sounds: Normal heart sounds.  Pulmonary:     Effort: Pulmonary effort is normal.     Breath sounds: Normal breath sounds.  Chest:  Breasts:    Tanner Score is 5.     Right: Normal.     Left: Normal.  Abdominal:     General: Bowel sounds are normal.     Palpations:  Abdomen is soft.     Comments: Obese, soft. Difficult to assess organomegaly.   Genitourinary:    Comments: deferred Musculoskeletal:        General: Normal range of motion.     Cervical back: Normal range of motion and neck supple.  Feet:     Right foot:     Protective Sensation: 5 sites tested.  5 sites sensed.     Skin integrity: Skin integrity normal.     Toenail Condition: Right toenails are normal.     Left foot:     Protective Sensation: 5 sites tested.  5 sites sensed.     Skin integrity: Skin integrity normal.     Toenail Condition: Left toenails are normal.  Skin:    General: Skin is warm and dry.  Neurological:     General: No focal deficit present.     Mental Status: She is alert and oriented to person, place, and time.  Psychiatric:        Mood and Affect: Mood normal.        Behavior: Behavior normal.         Assessment And Plan:     Annual physical exam Assessment & Plan: A full exam was performed.  Importance of monthly self breast exams was discussed with the patient.  She is advised to get 30-45 minutes of regular exercise, no less than four to five days per week. Both weight-bearing and aerobic exercises are recommended.  She is advised to follow a healthy diet with at least six fruits/veggies per day, decrease intake of red meat and other saturated fats and to increase fish intake to twice weekly.  Meats/fish should not be fried -- baked, boiled or broiled is preferable. It is also important to cut back on your sugar intake.  Be sure to read labels - try to avoid anything with added sugar, high fructose corn syrup or other sweeteners.  If you must use a sweetener, you can try stevia or monkfruit.  It is also important to avoid artificially sweetened foods/beverages and diet drinks. Lastly, wear SPF 50 sunscreen on exposed skin and when in direct sunlight for an extended period of time.  Be sure to avoid fast food restaurants and aim for at least 60 ounces of water  daily.       Dyslipidemia associated with type 2 diabetes mellitus (HCC) Assessment & Plan: Chronic, LDL goal is less than 70.  She will continue with rosuvastatin 5mg  daily. She is encouraged to follow a heart healthy lifestyle. Diabetic foot exam was performed.  She has experienced hypoglycemia, now off of metformin. Will c/w ozempic 1mg  weekly. BS low as 62. I will order CGM, previously denied by insurance.  I DISCUSSED WITH THE PATIENT AT LENGTH REGARDING THE GOALS OF GLYCEMIC CONTROL AND POSSIBLE LONG-TERM COMPLICATIONS.  I  ALSO STRESSED THE IMPORTANCE OF COMPLIANCE WITH HOME GLUCOSE MONITORING, DIETARY RESTRICTIONS INCLUDING AVOIDANCE OF SUGARY DRINKS/PROCESSED FOODS,  ALONG WITH REGULAR EXERCISE.  I  ALSO STRESSED THE IMPORTANCE OF ANNUAL EYE EXAMS, SELF FOOT CARE AND COMPLIANCE WITH OFFICE VISITS.   Orders: -     CBC -     CMP14+EGFR -     Lipid panel -     Hemoglobin A1c -     POCT urinalysis dipstick -     Microalbumin / creatinine urine ratio -     EKG 12-Lead -     Rosuvastatin Calcium; Take 1 tablet (5 mg total) by mouth daily.  Dispense: 90 tablet; Refill: 1  Essential hypertension, benign Assessment & Plan: Chronic, well controlled. EKG performed, NSR w/ voltage criteria for LVH, negative T-waves, possible anterior  ischemia.  She will continue with hydrochlorothiazide 12.5mg  daily along with lisinopril 5mg  daily.    Orders: -     POCT urinalysis dipstick -     Microalbumin / creatinine urine ratio -     EKG 12-Lead -     hydroCHLOROthiazide; TAKE 1 CAPSULE BY MOUTH EVERY DAY  Dispense: 90 capsule; Refill: 1  Chronic pain of right ankle -     Ambulatory referral to Podiatry  Class 2 severe obesity due to excess calories with serious comorbidity and body mass index (BMI) of 37.0 to 37.9 in adult [facility]) Assessment & Plan: She is now followed by [facility] clinic, their input is appreciated.  She is encouraged to strive for BMI less than 30 to decrease cardiac risk. Advised to  aim for at least 150 minutes of exercise per week.    Polypharmacy -     Vitamin B12  Other orders -     EPINEPHrine; Inject 0.3 mg into the muscle as needed for anaphylaxis. INJECT AS DIRECTED FOR ANAPHYLAXIS SHOCK  Dispense: 1 each; Refill: 2  She is encouraged to strive for BMI less than 30 to decrease cardiac risk. Advised to aim for at least 150 minutes of exercise per week.    Return for 1 year physical, 4 month DM. Patient was given opportunity to ask questions. Patient verbalized understanding of the plan and was able to repeat key elements of the plan. All questions were answered to their satisfaction.    I, Teresa Aliment, MD, have reviewed all documentation for this visit. The documentation on 05/22/23 for the exam, diagnosis, procedures, and orders are all accurate and complete.

## 2023-05-24 LAB — CBC
Hematocrit: 39 % (ref 34.0–46.6)
Hemoglobin: 12.5 g/dL (ref 11.1–15.9)
MCH: 30.2 pg (ref 26.6–33.0)
MCHC: 32.1 g/dL (ref 31.5–35.7)
MCV: 94 fL (ref 79–97)
Platelets: 236 10*3/uL (ref 150–450)
RBC: 4.14 x10E6/uL (ref 3.77–5.28)
RDW: 12.8 % (ref 11.7–15.4)
WBC: 6.1 10*3/uL (ref 3.4–10.8)

## 2023-05-24 LAB — MICROALBUMIN / CREATININE URINE RATIO
Creatinine, Urine: 109.5 mg/dL
Microalb/Creat Ratio: 6 mg/g{creat} (ref 0–29)
Microalbumin, Urine: 6.7 ug/mL

## 2023-05-24 LAB — CMP14+EGFR
ALT: 36 [IU]/L — ABNORMAL HIGH (ref 0–32)
AST: 41 [IU]/L — ABNORMAL HIGH (ref 0–40)
Albumin: 4.5 g/dL (ref 3.9–4.9)
Alkaline Phosphatase: 85 [IU]/L (ref 44–121)
BUN/Creatinine Ratio: 29 — ABNORMAL HIGH (ref 12–28)
BUN: 23 mg/dL (ref 8–27)
Bilirubin Total: 0.4 mg/dL (ref 0.0–1.2)
CO2: 26 mmol/L (ref 20–29)
Calcium: 9.3 mg/dL (ref 8.7–10.3)
Chloride: 100 mmol/L (ref 96–106)
Creatinine, Ser: 0.8 mg/dL (ref 0.57–1.00)
Globulin, Total: 2.9 g/dL (ref 1.5–4.5)
Glucose: 78 mg/dL (ref 70–99)
Potassium: 3.7 mmol/L (ref 3.5–5.2)
Sodium: 140 mmol/L (ref 134–144)
Total Protein: 7.4 g/dL (ref 6.0–8.5)
eGFR: 81 mL/min/{1.73_m2} (ref >59)

## 2023-05-24 LAB — VITAMIN B12: Vitamin B-12: >2000 pg/mL — ABNORMAL HIGH (ref 232–1245)

## 2023-05-24 LAB — LIPID PANEL
Chol/HDL Ratio: 2.4 {ratio} (ref 0.0–4.4)
Cholesterol, Total: 134 mg/dL (ref 100–199)
HDL: 56 mg/dL (ref >39)
LDL Chol Calc (NIH): 62 mg/dL (ref 0–99)
Triglycerides: 86 mg/dL (ref 0–149)
VLDL Cholesterol Cal: 16 mg/dL (ref 5–40)

## 2023-05-24 LAB — HEMOGLOBIN A1C
Est. average glucose Bld gHb Est-mCnc: 126 mg/dL
Hgb A1c MFr Bld: 6 % — ABNORMAL HIGH (ref 4.8–5.6)

## 2023-05-27 DIAGNOSIS — G8929 Other chronic pain: Secondary | ICD-10-CM | POA: Insufficient documentation

## 2023-05-27 DIAGNOSIS — E66812 Obesity, class 2: Secondary | ICD-10-CM | POA: Insufficient documentation

## 2023-05-27 NOTE — Assessment & Plan Note (Addendum)
She is now followed by [facility] clinic, their input is appreciated.  She is encouraged to strive for BMI less than 30 to decrease cardiac risk. Advised to aim for at least 150 minutes of exercise per week.

## 2023-05-27 NOTE — Assessment & Plan Note (Addendum)
Chronic, LDL goal is less than 70.  She will continue with rosuvastatin 5mg  daily. She is encouraged to follow a heart healthy lifestyle. Diabetic foot exam was performed.  She has experienced hypoglycemia, now off of metformin. Will c/w ozempic 1mg  weekly. BS low as 62. I will order CGM, previously denied by insurance.  I DISCUSSED WITH THE PATIENT AT LENGTH REGARDING THE GOALS OF GLYCEMIC CONTROL AND POSSIBLE LONG-TERM COMPLICATIONS.  I  ALSO STRESSED THE IMPORTANCE OF COMPLIANCE WITH HOME GLUCOSE MONITORING, DIETARY RESTRICTIONS INCLUDING AVOIDANCE OF SUGARY DRINKS/PROCESSED FOODS,  ALONG WITH REGULAR EXERCISE.  I  ALSO STRESSED THE IMPORTANCE OF ANNUAL EYE EXAMS, SELF FOOT CARE AND COMPLIANCE WITH OFFICE VISITS.

## 2023-05-27 NOTE — Assessment & Plan Note (Signed)
Chronic, well controlled. EKG performed, NSR w/ voltage criteria for LVH, negative T-waves, possible anterior  ischemia.  She will continue with hydrochlorothiazide 12.5mg  daily along with lisinopril 5mg  daily.

## 2023-05-28 ENCOUNTER — Ambulatory Visit: Payer: Medicare PPO | Admitting: Family Medicine

## 2023-05-28 ENCOUNTER — Encounter: Payer: Self-pay | Admitting: Family Medicine

## 2023-05-28 VITALS — BP 134/74 | HR 60 | Temp 97.5°F | Ht 63.0 in | Wt 206.0 lb

## 2023-05-28 DIAGNOSIS — Z9884 Bariatric surgery status: Secondary | ICD-10-CM | POA: Diagnosis not present

## 2023-05-28 DIAGNOSIS — E66812 Obesity, class 2: Secondary | ICD-10-CM | POA: Diagnosis not present

## 2023-05-28 DIAGNOSIS — Z6836 Body mass index (BMI) 36.0-36.9, adult: Secondary | ICD-10-CM

## 2023-05-28 DIAGNOSIS — Z7985 Long-term (current) use of injectable non-insulin antidiabetic drugs: Secondary | ICD-10-CM

## 2023-05-28 DIAGNOSIS — E1169 Type 2 diabetes mellitus with other specified complication: Secondary | ICD-10-CM | POA: Diagnosis not present

## 2023-05-28 DIAGNOSIS — R635 Abnormal weight gain: Secondary | ICD-10-CM

## 2023-05-28 MED ORDER — OZEMPIC (2 MG/DOSE) 8 MG/3ML ~~LOC~~ SOPN
2.0000 mg | PEN_INJECTOR | SUBCUTANEOUS | 1 refills | Status: DC
Start: 2023-05-28 — End: 2023-07-08

## 2023-05-28 NOTE — Patient Instructions (Addendum)
Increase Ozempic to 2 mg weekly Call if any problems or questions  Track daily steps aiming for 10,000 steps/ day Add in weight training 2 x a week  Get in 80 oz of water daily Continue to track daily calories: aim for 1400 per day Do not add in your workouts into your tracking ap Aim for 80-100 g of protein daily

## 2023-05-28 NOTE — Assessment & Plan Note (Signed)
Lab Results  Component Value Date   HGBA1C 6.0 (H) 05/22/2023   She has good control of her type 2 diabetes.  She is doing well with medically supervised weight management.  She feels adequate satiety from Ozempic with some waning effectiveness over the past 2 weeks with increased hunger.  She denies heartburn, nausea or epigastric pain.  She is also working on diet and exercise changes.  Increase Ozempic to 2 mg once weekly injection while continuing to work on prescribed meal plan, 30 minutes of exercise 5 days a week.

## 2023-05-28 NOTE — Assessment & Plan Note (Signed)
Patient has done well with medically supervised weight management for the treatment of postop regain status post Roux-en-Y gastric bypass surgery done in 2014.  She plans to hold off on moving forward with the Endo Stitch surgery in [place], [place] unless her body weight exceeds 190 pounds by July 2025.  She is enjoying the added satiety from use of Ozempic along with increased intake of lean protein and fiber with meals.  Continue on a bariatric multivitamin once daily.  Annual bariatric labs are reviewed and up-to-date. Encouraged dietary logging with approximately 100 g of protein intake daily

## 2023-05-28 NOTE — Progress Notes (Signed)
Office: [phone]  /  Fax: [phone]  WEIGHT SUMMARY AND BIOMETRICS  Starting Date: 01/01/23  Starting Weight: 214lb   Weight Lost Since Last Visit: 3lb   Vitals Temp: (!) 97.5 F (36.4 C) BP: 134/74 Pulse Rate: 60 SpO2: 100 %   Body Composition  Body Fat %: 46 % Fat Mass (lbs): 95 lbs Muscle Mass (lbs): 105.8 lbs Total Body Water (lbs): 72.6 lbs Visceral Fat Rating : 14    HPI  Chief Complaint: OBESITY  Teresa Newton is here to discuss her progress with her obesity treatment plan. She is on the the [facility] and states she is following her eating plan approximately 100 % of the time. She states she is exercising 30 minutes 3 times per week.   Interval History:  Since last office visit she is down 3 lb She has a net weight loss of 8 lb in the past 4 mos She is tracking daily kcal getting 1600 cal/ day with 80-100 g of protein daily She has minimized snacking Sugar cravings are under good control She is doing well on Ozempic 1 mg weekly She is off of metformin She did buy a walking pad and will be working out with a trainer 2 days/ wk Her daughter is her big support system  Pharmacotherapy: Ozempic 1 mg weekly  PHYSICAL EXAM:  Blood pressure 134/74, pulse 60, temperature (!) 97.5 F (36.4 C), height 5\' 3"  (1.6 m), weight 206 lb (93.4 kg), SpO2 100%. Body mass index is 36.49 kg/m.  General: She is healthy appearing, cooperative, alert, well developed, and in no acute distress. PSYCH: Has normal mood, affect and thought process.   Lungs: Normal breathing effort, no conversational dyspnea.   ASSESSMENT AND PLAN  TREATMENT PLAN FOR OBESITY:  Recommended Dietary Goals  [name] is currently in the action stage of change. As such, her goal is to continue weight management plan. She has agreed to the [employer].  Behavioral Intervention  We discussed the following Behavioral Modification Strategies today: increasing lean protein intake to  established goals, increasing fiber rich foods, increasing water intake , work on meal planning and preparation, keeping healthy foods at home, decreasing eating out or consumption of processed foods, and making healthy choices when eating convenient foods, work on managing stress, creating time for self-care and relaxation, avoiding temptations and identifying enticing environmental cues, planning for success, and continue to work on maintaining a reduced calorie state, getting the recommended amount of protein, incorporating whole foods, making healthy choices, staying well hydrated and practicing mindfulness when eating..  Additional resources provided today: NA  Recommended Physical Activity Goals  [name] has been advised to work up to 150 minutes of moderate intensity aerobic activity a week and strengthening exercises 2-3 times per week for cardiovascular health, weight loss maintenance and preservation of muscle mass.   She has agreed to Increase the intensity, frequency or duration of strengthening exercises  and Increase the intensity, frequency or duration of aerobic exercises    Pharmacotherapy changes for the treatment of obesity: Increase Ozempic to 2 mg once weekly injection  ASSOCIATED CONDITIONS ADDRESSED TODAY  Type 2 diabetes mellitus with other specified complication, unspecified whether long term insulin use (HCC) Assessment & Plan: Lab Results  Component Value Date   HGBA1C 6.0 (H) 05/22/2023   She has good control of her type 2 diabetes.  She is doing well with medically supervised weight management.  She feels adequate satiety from Ozempic with some waning effectiveness over the past  2 weeks with increased hunger.  She denies heartburn, nausea or epigastric pain.  She is also working on diet and exercise changes.  Increase Ozempic to 2 mg once weekly injection while continuing to work on prescribed meal plan, 30 minutes of exercise 5 days a week.  Orders: -     Ozempic  (2 MG/DOSE); Inject 2 mg into the skin once a week.  Dispense: 3 mL; Refill: 1  Class 2 severe obesity due to excess calories with serious comorbidity and body mass index (BMI) of 36.0 to 36.9 in adult (HCC)  Weight gain status post gastric bypass Assessment & Plan: Patient has done well with medically supervised weight management for the treatment of postop regain status post Roux-en-Y gastric bypass surgery done in 2014.  She plans to hold off on moving forward with the Endo Stitch surgery in [place], [place] unless her body weight exceeds 190 pounds by July 2025.  She is enjoying the added satiety from use of Ozempic along with increased intake of lean protein and fiber with meals.  Continue on a bariatric multivitamin once daily.  Annual bariatric labs are reviewed and up-to-date. Encouraged dietary logging with approximately 100 g of protein intake daily       She was informed of the importance of frequent follow up visits to maximize her success with intensive lifestyle modifications for her multiple health conditions.   ATTESTASTION STATEMENTS:  Reviewed by clinician on day of visit: allergies, medications, problem list, medical history, surgical history, family history, social history, and previous encounter notes pertinent to obesity diagnosis.   I have personally spent 30 minutes total time today in preparation, patient care, nutritional counseling and documentation for this visit, including the following: review of clinical lab tests; review of medical tests/procedures/services.      [name], DO DABFM, DABOM [facility] [address] [place], [place] [address] [phone]

## 2023-06-06 ENCOUNTER — Ambulatory Visit: Payer: Medicare PPO | Admitting: Podiatry

## 2023-06-06 ENCOUNTER — Other Ambulatory Visit: Payer: Self-pay

## 2023-06-06 ENCOUNTER — Encounter: Payer: Self-pay | Admitting: Podiatry

## 2023-06-06 ENCOUNTER — Ambulatory Visit: Payer: Medicare PPO

## 2023-06-06 DIAGNOSIS — M19071 Primary osteoarthritis, right ankle and foot: Secondary | ICD-10-CM

## 2023-06-06 DIAGNOSIS — G8929 Other chronic pain: Secondary | ICD-10-CM | POA: Diagnosis not present

## 2023-06-06 DIAGNOSIS — M25571 Pain in right ankle and joints of right foot: Secondary | ICD-10-CM | POA: Diagnosis not present

## 2023-06-06 DIAGNOSIS — M7731 Calcaneal spur, right foot: Secondary | ICD-10-CM | POA: Diagnosis not present

## 2023-06-06 MED ORDER — MELOXICAM 15 MG PO TABS: 15.0000 mg | ORAL_TABLET | Freq: Every day | ORAL | 0 refills | Status: AC

## 2023-06-06 NOTE — Progress Notes (Unsigned)
Dg ankle

## 2023-06-06 NOTE — Progress Notes (Signed)
  Subjective:  Patient ID: Benjamin Stain, female    DOB: 1956/01/24,   MRN: [id number]  No chief complaint on file.   68 y.o. female presents for concern of right ankle pain that has been ongoing for about 6 months. She does relate in 2019 she fracture her ankle and had surgery. She was doing fine then noticed pains mostly burning and shooting pain that come and go into the ankle and foot. She does have some lower back issues and she drives a bus. Denies any current treatments aside from trying some different shoes. She does have a history of right ankle fracture and repair.   Patient is diabetic and last A1c was  Lab Results  Component Value Date   HGBA1C 6.0 (H) 05/22/2023   .   PCP:  Dorothyann Peng, MD    . Denies any other pedal complaints. Denies n/v/f/c.   Past Medical History:  Diagnosis Date   ADD (attention deficit disorder)    Back pain    Bilateral edema of lower extremity    Bimalleolar ankle fracture    right   Constipation    Diabetes (HCC)    Edema of both lower legs    Food allergy    GERD (gastroesophageal reflux disease)    Hyperlipidemia    Hypertension    Joint pain    Lactose intolerance    LTBI (latent tuberculosis infection)    Other fatigue    Plaque psoriasis    Pre-diabetes    Shortness of breath on exertion    Sleep apnea    gastric bypass, lost 70lbs and no longer needs CPAP   Stomach ulcer     Objective:  Physical Exam: Vascular: DP/PT pulses 2/4 bilateral. CFT <3 seconds. Normal hair growth on digits. No edema.  Skin. No lacerations or abrasions bilateral feet.  Musculoskeletal: MMT 5/5 bilateral lower extremities in DF, PF, Inversion and Eversion. Deceased ROM in DF of ankle joint. Minimal pain to palpation about the ankle but relates burning and tingling medial and lateral. Some pain with end range DF of the ankle. No pain with Rom of the subtalar joint. Mild HAV deformity noted.  Neurological: Sensation intact to light touch.    Assessment:   1. Arthritis of ankle, right      Plan:  Patient was evaluated and treated and all questions answered. X-rays reviewed and discussed with patient. Retained Hardware from previous ankle ORIF. Hardware intact. Joint space appreciated and no major degenerative changes noted.  Discussed early ankle arthirits vs potential radiculopathy and treatment options at length with patient. Prescription for meloxicam provided. BUN elevated so will try for one month then hold.  Dispensed Tri-Lock ankle brace. Discussed that if the symptoms do not improve can consider PT/MRI. Also would consider checking on back for possible contribution of pain.  Patient to return in 6 to 8 weeks or sooner if symptoms fail to improve or worsen.   Louann Sjogren, DPM

## 2023-06-21 ENCOUNTER — Other Ambulatory Visit: Payer: Self-pay | Admitting: Internal Medicine

## 2023-06-21 DIAGNOSIS — I152 Hypertension secondary to endocrine disorders: Secondary | ICD-10-CM

## 2023-06-27 ENCOUNTER — Ambulatory Visit: Payer: Medicare PPO | Admitting: Family Medicine

## 2023-06-30 ENCOUNTER — Encounter (HOSPITAL_BASED_OUTPATIENT_CLINIC_OR_DEPARTMENT_OTHER): Payer: Self-pay | Admitting: Emergency Medicine

## 2023-06-30 ENCOUNTER — Emergency Department (HOSPITAL_BASED_OUTPATIENT_CLINIC_OR_DEPARTMENT_OTHER): Payer: Medicare PPO

## 2023-06-30 ENCOUNTER — Emergency Department (HOSPITAL_BASED_OUTPATIENT_CLINIC_OR_DEPARTMENT_OTHER)
Admission: EM | Admit: 2023-06-30 | Discharge: 2023-06-30 | Disposition: A | Discharge status: Home / Self Care | Payer: Medicare PPO | Attending: Emergency Medicine | Admitting: Emergency Medicine

## 2023-06-30 ENCOUNTER — Other Ambulatory Visit: Payer: Self-pay

## 2023-06-30 DIAGNOSIS — Z79899 Other long term (current) drug therapy: Secondary | ICD-10-CM | POA: Insufficient documentation

## 2023-06-30 DIAGNOSIS — E119 Type 2 diabetes mellitus without complications: Secondary | ICD-10-CM | POA: Diagnosis not present

## 2023-06-30 DIAGNOSIS — Z7982 Long term (current) use of aspirin: Secondary | ICD-10-CM | POA: Diagnosis not present

## 2023-06-30 DIAGNOSIS — I10 Essential (primary) hypertension: Secondary | ICD-10-CM | POA: Diagnosis not present

## 2023-06-30 DIAGNOSIS — R059 Cough, unspecified: Secondary | ICD-10-CM | POA: Diagnosis not present

## 2023-06-30 DIAGNOSIS — J069 Acute upper respiratory infection, unspecified: Secondary | ICD-10-CM | POA: Insufficient documentation

## 2023-06-30 DIAGNOSIS — B9789 Other viral agents as the cause of diseases classified elsewhere: Secondary | ICD-10-CM | POA: Diagnosis not present

## 2023-06-30 LAB — RESP PANEL BY RT-PCR (RSV, FLU A&B, COVID)  RVPGX2
Influenza A by PCR: NEGATIVE
Influenza B by PCR: NEGATIVE
Resp Syncytial Virus by PCR: NEGATIVE
SARS Coronavirus 2 by RT PCR: NEGATIVE

## 2023-06-30 MED ORDER — GUAIFENESIN ER 1200 MG PO TB12: 1.0000 | ORAL_TABLET | Freq: Two times a day (BID) | ORAL | 0 refills | Status: DC

## 2023-06-30 MED ORDER — BENZONATATE 100 MG PO CAPS: 100.0000 mg | ORAL_CAPSULE | Freq: Three times a day (TID) | ORAL | 0 refills | Status: DC

## 2023-06-30 NOTE — Discharge Instructions (Signed)
 The x-ray did not show any signs of pneumonia.  Take the medications to help with your cough and congestion.  Your symptoms should improve over the next week.  Follow-up with your doctor to be rechecked if you are not feeling better

## 2023-06-30 NOTE — ED Provider Notes (Signed)
 [place] [facility] Provider Note   CSN: [id number] Arrival date & time: 06/30/23  1616     History  Chief Complaint  Patient presents with   Cough    Teresa Newton is a 68 y.o. female.   Cough    Patient presents to the ED for evaluation of cough fever URI symptoms.  Patient has a history of hypertension diabetes hyperlipidemia acid reflux.  She states about a week or so ago she started having symptoms that she felt was most likely the flu.  Patient did not do any formal testing but states she has had the flu before and she is pretty sure that is what it was.  She has been having cough she has had subjective fevers.  She has not been sleeping well.  She denies any chest pain or shortness of breath.  No leg swelling no abdominal pain no dysuria.  States her symptoms were not getting any better so she came to the ED  Home Medications Prior to Admission medications   Medication Sig Start Date End Date Taking? Authorizing Provider  benzonatate (TESSALON) 100 MG capsule Take 1 capsule (100 mg total) by mouth every 8 (eight) hours. 06/30/23  Yes Linwood Dibbles, MD  Guaifenesin 1200 MG TB12 Take 1 tablet (1,200 mg total) by mouth 2 (two) times daily at 10 AM and 5 PM. 06/30/23  Yes Linwood Dibbles, MD  aspirin 81 MG tablet Take 81 mg by mouth daily.    [provider]  CALCIUM PO Take 650 mg by mouth daily.    [provider]  Cholecalciferol (D3 5000) 125 MCG (5000 UT) capsule 5,000 IU every other day 06/28/21   Thomasene Lot, DO  Continuous Blood Gluc Receiver (DEXCOM G7 RECEIVER) DEVI USE TO CHECK BLOOD SUGARS. 12/25/21   Dorothyann Peng, MD  Continuous Blood Gluc Sensor (DEXCOM G7 SENSOR) MISC USE TO CHECK BLOOD SUGARS E11.65 12/25/21   Dorothyann Peng, MD  COSENTYX 150 MG/ML SOSY Inject into the skin every 30 (thirty) days.    [provider]  Cyanocobalamin (B-12) 500 MCG TABS 400- 500 mcg daily 06/28/21   Opalski, Deborah, DO   ELDERBERRY PO Take 50 mg by mouth daily.    [provider]  EPINEPHrine 0.3 mg/0.3 mL IJ SOAJ injection Inject 0.3 mg into the muscle as needed for anaphylaxis. INJECT AS DIRECTED FOR ANAPHYLAXIS SHOCK 05/22/23   Dorothyann Peng, MD  Ginger 500 MG CAPS Take 2 capsules by mouth daily.    [provider]  hydrochlorothiazide (MICROZIDE) 12.5 MG capsule TAKE 1 CAPSULE BY MOUTH EVERY DAY 05/22/23   Dorothyann Peng, MD  lisinopril (ZESTRIL) 5 MG tablet TAKE 1 TABLET (5 MG TOTAL) BY MOUTH DAILY. 06/21/23   Dorothyann Peng, MD  Magnesium 250 MG TABS Take 1 tablet by mouth daily.    [provider]  meloxicam (MOBIC) 15 MG tablet Take 1 tablet (15 mg total) by mouth daily. 06/06/23   Louann Sjogren, DPM  POTASSIUM PO Take 99 mg by mouth daily.    [provider]  Prenatal Vit-Fe Fumarate-FA (PRENATAL PO) Take 2 tablets by mouth daily.    [provider]  rosuvastatin (CRESTOR) 5 MG tablet Take 1 tablet (5 mg total) by mouth daily. 05/22/23   Dorothyann Peng, MD  Semaglutide, 2 MG/DOSE, (OZEMPIC, 2 MG/DOSE,) 8 MG/3ML SOPN Inject 2 mg into the skin once a week. 05/28/23   Bowen, Scot Jun, DO  Turmeric 500 MG CAPS  Take 2 tablets by mouth daily.    [provider]      Allergies    Fish allergy, Milk-related compounds, and Penicillins    Review of Systems   Review of Systems  Respiratory:  Positive for cough.     Physical Exam Updated Vital Signs BP 133/64 (BP Location: Right Arm)   Pulse 71   Temp 97.9 F (36.6 C)   Resp 17   Ht 1.6 m (5\' 3" )   Wt 93.4 kg   SpO2 99%   BMI 36.48 kg/m  Physical Exam Vitals and nursing note reviewed.  Constitutional:      General: She is not in acute distress.    Appearance: She is well-developed.  HENT:     Head: Normocephalic and atraumatic.     Right Ear: External ear normal.     Left Ear: External ear normal.  Eyes:     General: No scleral icterus.       Right eye: No discharge.        Left eye: No  discharge.     Conjunctiva/sclera: Conjunctivae normal.  Neck:     Trachea: No tracheal deviation.  Cardiovascular:     Rate and Rhythm: Normal rate and regular rhythm.  Pulmonary:     Effort: Pulmonary effort is normal. No respiratory distress.     Breath sounds: Normal breath sounds. No stridor. No wheezing or rales.  Abdominal:     General: Bowel sounds are normal. There is no distension.     Palpations: Abdomen is soft.     Tenderness: There is no abdominal tenderness. There is no guarding or rebound.  Musculoskeletal:        General: No tenderness or deformity.     Cervical back: Neck supple.  Skin:    General: Skin is warm and dry.     Findings: No rash.  Neurological:     General: No focal deficit present.     Mental Status: She is alert.     Cranial Nerves: No cranial nerve deficit, dysarthria or facial asymmetry.     Sensory: No sensory deficit.     Motor: No abnormal muscle tone or seizure activity.     Coordination: Coordination normal.  Psychiatric:        Mood and Affect: Mood normal.     ED Results / Procedures / Treatments   Labs (all labs ordered are listed, but only abnormal results are displayed) Labs Reviewed  RESP PANEL BY RT-PCR (RSV, FLU A&B, COVID)  [id number]    EKG None  Radiology DG Chest 2 View Result Date: 06/30/2023 CLINICAL DATA:  Cough for 1 week EXAM: CHEST - 2 VIEW COMPARISON:  None Available. FINDINGS: Normal mediastinum and cardiac silhouette. Normal pulmonary vasculature. No evidence of effusion, infiltrate, or pneumothorax. No acute bony abnormality. IMPRESSION: No acute cardiopulmonary process. Electronically Signed   By: Genevive Bi M.D.   On: 06/30/2023 17:18    Procedures Procedures    Medications Ordered in ED Medications - No data to display  ED Course/ Medical Decision Making/ A&P Clinical Course as of 06/30/23 1740  Sun Jun 30, 2023  1729 DG Chest 2 View Chest x-ray without process [JK]    Clinical Course User  Index [JK] Linwood Dibbles, MD                                 Medical Decision Making Problems Addressed:  Viral URI with cough: acute illness or injury that poses a threat to life or bodily functions  Amount and/or Complexity of Data Reviewed Radiology: ordered and independent interpretation performed. Decision-making details documented in ED Course.  Risk OTC drugs. Prescription drug management.   Patient presented to the ED for evaluation of persistent URI symptoms.  Patient felt that she had the flu last week.  ED workup is reassuring.  Patient's lungs are clear she does not have an oxygen requirement.  She is not having any shortness of breath.  Chest x-ray does not show pneumonia or pneumothorax or other acute abnormality.  Patient's COVID flu RSV test are negative today.   Evaluation and diagnostic testing in the emergency department does not suggest an emergent condition requiring admission or immediate intervention beyond what has been performed at this time.  The patient is safe for discharge and has been instructed to return immediately for worsening symptoms, change in symptoms or any other concerns.         Final Clinical Impression(s) / ED Diagnoses Final diagnoses:  Viral URI with cough    Rx / DC Orders ED Discharge Orders          Ordered    benzonatate (TESSALON) 100 MG capsule  Every 8 hours        06/30/23 1739    Guaifenesin 1200 MG TB12  2 times daily        06/30/23 1739              Linwood Dibbles, MD 06/30/23 1740

## 2023-06-30 NOTE — ED Triage Notes (Signed)
 Pt w/ cough, fever, insomnia, decreased appetite x 1 wk

## 2023-07-08 ENCOUNTER — Encounter: Payer: Self-pay | Admitting: Bariatrics

## 2023-07-08 ENCOUNTER — Ambulatory Visit: Admitting: Bariatrics

## 2023-07-08 ENCOUNTER — Telehealth: Payer: Self-pay | Admitting: Podiatry

## 2023-07-08 VITALS — BP 123/74 | HR 82 | Temp 97.9°F | Ht 63.0 in | Wt 201.0 lb

## 2023-07-08 DIAGNOSIS — Z6835 Body mass index (BMI) 35.0-35.9, adult: Secondary | ICD-10-CM | POA: Diagnosis not present

## 2023-07-08 DIAGNOSIS — I10 Essential (primary) hypertension: Secondary | ICD-10-CM | POA: Diagnosis not present

## 2023-07-08 DIAGNOSIS — Z7985 Long-term (current) use of injectable non-insulin antidiabetic drugs: Secondary | ICD-10-CM | POA: Diagnosis not present

## 2023-07-08 DIAGNOSIS — E1169 Type 2 diabetes mellitus with other specified complication: Secondary | ICD-10-CM

## 2023-07-08 DIAGNOSIS — E669 Obesity, unspecified: Secondary | ICD-10-CM

## 2023-07-08 DIAGNOSIS — E119 Type 2 diabetes mellitus without complications: Secondary | ICD-10-CM

## 2023-07-08 MED ORDER — OZEMPIC (2 MG/DOSE) 8 MG/3ML ~~LOC~~ SOPN: 2.0000 mg | PEN_INJECTOR | SUBCUTANEOUS | 1 refills | Status: DC

## 2023-07-08 NOTE — Telephone Encounter (Signed)
 Pt left message on 3/1 at 125pm to cxl appt on 3/13.  I returned call and left message that I have cxled appt and to call to r/s

## 2023-07-08 NOTE — Progress Notes (Signed)
 Office: [phone]  /  Fax: [phone]  WEIGHT SUMMARY AND BIOMETRICS  Starting Date: 01/01/23  Starting Weight: 214lb   Weight Lost Since Last Visit: 5lb   Vitals Temp: 97.9 F (36.6 C) BP: 123/74 Pulse Rate: 82 SpO2: 98 %   Body Composition  Body Fat %: 46 % Fat Mass (lbs): 92.4 lbs Muscle Mass (lbs): 103 lbs Total Body Water (lbs): 75 lbs Visceral Fat Rating : 14   HPI  Chief Complaint: OBESITY  Teresa Newton is here to discuss her progress with her obesity treatment plan. She is on the the [facility] and states she is following her eating plan approximately 100 % of the time. She states she is exercising 30-60 minutes 5 times per week.   Interval History:  Since last office visit she has lost 5 lbs. She has hit a plateau. She has been sick for 2 weeks. Her appetite is good when not sick. Her cravings are good.    Pharmacotherapy: Ozempic  PHYSICAL EXAM:  Blood pressure 123/74, pulse 82, temperature 97.9 F (36.6 C), height 5\' 3"  (1.6 m), weight 201 lb (91.2 kg), SpO2 98%. Body mass index is 35.61 kg/m.  General: She is overweight, cooperative, alert, well developed, and in no acute distress. PSYCH: Has normal mood, affect and thought process.   Lungs: Normal breathing effort, no conversational dyspnea.   ASSESSMENT AND PLAN  Type II Diabetes HgbA1c is at goal. Last A1c was 6.0 CBGs: Fasting 90 to 116        Episodes of hypoglycemia: no Medication(s): Ozempic 2 mg SQ weekly  Lab Results  Component Value Date   HGBA1C 6.0 (H) 05/22/2023   HGBA1C 6.0 (H) 01/01/2023   HGBA1C 6.3 (H) 08/16/2022   Lab Results  Component Value Date   MICROALBUR 10 11/21/2020   LDLCALC 62 05/22/2023   CREATININE 0.80 05/22/2023    Plan: Continue and refill Ozempic 2 mg SQ weekly May consider Mounjaro in the future if her insurance will support. ( Good improvement in A1c , but her weight loss has stalled according to patient.  Will keep all carbohydrates  low both sweets and starches.  Will continue exercise regimen to 30 to 60 minutes on most days of the week.  Aim for 7 to 9 hours of sleep nightly.  Eat more low glycemic index foods.   Hypertension Hypertension well controlled.  Medication(s): Hydrochlorothiazide 12.5 mg daily  BP Readings from Last 3 Encounters:  07/08/23 123/74  06/30/23 133/64  05/28/23 134/74   Lab Results  Component Value Date   CREATININE 0.80 05/22/2023   CREATININE 0.89 01/01/2023   CREATININE 0.81 08/16/2022    Plan: Continue all antihypertensives at current dosages. No added salt. Will keep sodium content to 1,500 mg or less per day.    TREATMENT PLAN FOR OBESITY:  Recommended Dietary Goals  Teresa Newton is currently in the action stage of change. As such, her goal is to continue weight management plan. She has agreed to the [employer]. She will increase her protein.   Behavioral Intervention  We discussed the following Behavioral Modification Strategies today: continue to work on maintaining a reduced calorie state, getting the recommended amount of protein, incorporating whole foods, making healthy choices, staying well hydrated and practicing mindfulness when eating..  Additional resources provided today: NA  Recommended Physical Activity Goals  Teresa Newton has been advised to work up to 150 minutes of moderate intensity aerobic activity a week and strengthening exercises 2-3 times per week for cardiovascular  health, weight loss maintenance and preservation of muscle mass.   She has agreed to Think about enjoyable ways to increase daily physical activity and overcoming barriers to exercise and Increase physical activity in their day and reduce sedentary time (increase NEAT).  Pharmacotherapy changes for the treatment of obesity:   ASSOCIATED CONDITIONS ADDRESSED TODAY   She was informed of the importance of frequent follow up visits to maximize her success with intensive lifestyle  modifications for her multiple health conditions.   ATTESTASTION STATEMENTS:  Reviewed by clinician on day of visit: allergies, medications, problem list, medical history, surgical history, family history, social history, and previous encounter notes pertinent to obesity diagnosis.   Verlon Setting, CMA DABFM, DABOM [facility] [address] [place], [place] [address] [phone]  Corinna Capra, DO

## 2023-07-09 ENCOUNTER — Ambulatory Visit: Payer: Medicare PPO | Admitting: Family Medicine

## 2023-07-18 ENCOUNTER — Ambulatory Visit: Payer: Medicare PPO | Admitting: Podiatry

## 2023-08-05 ENCOUNTER — Ambulatory Visit: Admitting: Family Medicine

## 2023-08-05 ENCOUNTER — Encounter: Payer: Self-pay | Admitting: Family Medicine

## 2023-08-05 ENCOUNTER — Encounter (INDEPENDENT_AMBULATORY_CARE_PROVIDER_SITE_OTHER): Payer: Self-pay

## 2023-08-05 VITALS — BP 124/78 | HR 72 | Temp 98.0°F | Ht 63.0 in | Wt 199.0 lb

## 2023-08-05 DIAGNOSIS — E1169 Type 2 diabetes mellitus with other specified complication: Secondary | ICD-10-CM | POA: Diagnosis not present

## 2023-08-05 DIAGNOSIS — Z9884 Bariatric surgery status: Secondary | ICD-10-CM | POA: Diagnosis not present

## 2023-08-05 DIAGNOSIS — R635 Abnormal weight gain: Secondary | ICD-10-CM

## 2023-08-05 DIAGNOSIS — E66812 Obesity, class 2: Secondary | ICD-10-CM | POA: Diagnosis not present

## 2023-08-05 DIAGNOSIS — Z7985 Long-term (current) use of injectable non-insulin antidiabetic drugs: Secondary | ICD-10-CM | POA: Diagnosis not present

## 2023-08-05 DIAGNOSIS — Z6835 Body mass index (BMI) 35.0-35.9, adult: Secondary | ICD-10-CM | POA: Diagnosis not present

## 2023-08-05 MED ORDER — TIRZEPATIDE 7.5 MG/0.5ML ~~LOC~~ SOAJ
7.5000 mg | SUBCUTANEOUS | 0 refills | Status: DC
Start: 2023-08-05 — End: 2023-09-23

## 2023-08-05 NOTE — Patient Instructions (Signed)
 Continue to work on: Eating on a schedule Tracking daily calories on [employer] Aim for 1300 cal/ day This should include 85+ g of protein daily Limit snacks to 2 per day (fresh fruit, greek yogurts, cottage cheese)  Hydrate well with water / sugar free fluids between meals  Finish out last 2 Ozempic injections 7 days after last Ozempic injection, begin Mounjaro 7.5 mg weekly Call if any problems or questions  Once feeling better, resume walking 30 min/ day and weight training 2 days / week

## 2023-08-05 NOTE — Progress Notes (Signed)
 Office: [phone]  /  Fax: [phone]  WEIGHT SUMMARY AND BIOMETRICS  Starting Date: 01/01/23  Starting Weight: 214lb   Weight Lost Since Last Visit: 2lb   Vitals Temp: 98 F (36.7 C) BP: 124/78 Pulse Rate: 72 SpO2: 97 %   Body Composition  Body Fat %: 45 % Fat Mass (lbs): 90 lbs Muscle Mass (lbs): 104.2 lbs Total Body Water (lbs): 74 lbs Visceral Fat Rating : 14   HPI  Chief Complaint: OBESITY  Teresa Newton is here to discuss her progress with her obesity treatment plan. She is on the the [facility] and states she is following her eating plan approximately 100 % of the time. She states she is exercising 0 minutes 0 times per week.  Interval History:  Since last office visit she is down 2 lb She had the flu since her last visit She is fasting with her church She is working on water intake She has not yet resumed exercise since she was sick She was doing weight lifting with a trainer 2 days/ wk She plans to resume use of her walking pad She has been taking Ozempic 2 mg weekly She has been getting ~1600 cal/ day; adding in lamb (not logging) She has had some nausea from Ozempic She has been snacking on a lot of nuts Her net weight loss is now 15 lb in 7 mos This is a 7% TBW Loss  Pharmacotherapy: Ozempic 2 mg weekly  PHYSICAL EXAM:  Blood pressure 124/78, pulse 72, temperature 98 F (36.7 C), height 5\' 3"  (1.6 m), weight 199 lb (90.3 kg), SpO2 97%. Body mass index is 35.25 kg/m.  General: She is overweight, cooperative, alert, well developed, and in no acute distress. PSYCH: Has normal mood, affect and thought process.   Lungs: Normal breathing effort, no conversational dyspnea.   ASSESSMENT AND PLAN  TREATMENT PLAN FOR OBESITY:  Recommended Dietary Goals  Teresa Newton is currently in the action stage of change. As such, her goal is to continue weight management plan. She has agreed to keeping a food journal and adhering to recommended goals of  1300 calories and 85+ g of protein and practicing portion control and making smarter food choices, such as increasing vegetables and decreasing simple carbohydrates.  Behavioral Intervention  We discussed the following Behavioral Modification Strategies today: increasing lean protein intake to established goals, increasing fiber rich foods, increasing water intake , work on tracking and journaling calories using tracking application, keeping healthy foods at home, practice mindfulness eating and understand the difference between hunger signals and cravings, work on managing stress, creating time for self-care and relaxation, avoiding temptations and identifying enticing environmental cues, continue to work on implementation of reduced calorie nutritional plan, and continue to work on maintaining a reduced calorie state, getting the recommended amount of protein, incorporating whole foods, making healthy choices, staying well hydrated and practicing mindfulness when eating..  Additional resources provided today: NA  Recommended Physical Activity Goals  Teresa Newton has been advised to work up to 150 minutes of moderate intensity aerobic activity a week and strengthening exercises 2-3 times per week for cardiovascular health, weight loss maintenance and preservation of muscle mass.   She has agreed to Increase physical activity in their day and reduce sedentary time (increase NEAT)., Start strengthening exercises with a goal of 2-3 sessions a week , and Increase the intensity, frequency or duration of aerobic exercises   Reviewed plan of care on AVS  Pharmacotherapy changes for the treatment of obesity: change  Ozempic 2 mg weekly to Mounjaro 7.5 mg weekly injection  ASSOCIATED CONDITIONS ADDRESSED TODAY  Type 2 diabetes mellitus with other specified complication, unspecified whether long term insulin use (HCC) Lab Results  Component Value Date   HGBA1C 6.0 (H) 05/22/2023  She has well controlled  T2DM She has done well with weight reduction, improved dietary intake and regular exercise while on Ozempic but has been over- snacking and has had some nausea on the 2 mg dose  We discussed a plan on AVS to track calorie intake Limit intake of sugar Resume walking with a goal of 30 min 5day/ wk + weight training 2 x a week Change over to Mounjaro 7.5 mg weekly  -     Tirzepatide; Inject 7.5 mg into the skin once a week.  Dispense: 2 mL; Refill: 0  Class 2 severe obesity due to excess calories with serious comorbidity and body mass index (BMI) of 35.0 to 35.9 in adult [facility])  Weight gain status post gastric bypass Improving She has adequate restriction at mealtime with portions and is working on improving food choices on Ozempic post RYGB surgery in 2014 and is no longer interested in revision procedures. She is still over- consuming high calorie snacks  We discussed the importance of creating a calorie deficit for further weight loss At her current ~1600 cal/ given her REE and energy expenditure, she was eating enough for maintenance  LOG daily intake Limit high cal snacks Resume workouts as discussed once energy level improve post flu     She was informed of the importance of frequent follow up visits to maximize her success with intensive lifestyle modifications for her multiple health conditions.   ATTESTASTION STATEMENTS:  Reviewed by clinician on day of visit: allergies, medications, problem list, medical history, surgical history, family history, social history, and previous encounter notes pertinent to obesity diagnosis.   I have personally spent 30 minutes total time today in preparation, patient care, nutritional counseling and documentation for this visit, including the following: review of clinical lab tests; review of medical tests/procedures/services.      [name], DO DABFM, DABOM [facility] [address] [place], [place]  [address] [phone]

## 2023-08-06 DIAGNOSIS — Z79899 Other long term (current) drug therapy: Secondary | ICD-10-CM | POA: Diagnosis not present

## 2023-08-06 DIAGNOSIS — L400 Psoriasis vulgaris: Secondary | ICD-10-CM | POA: Diagnosis not present

## 2023-09-04 ENCOUNTER — Ambulatory Visit: Admitting: Family Medicine

## 2023-09-04 ENCOUNTER — Encounter: Payer: Self-pay | Admitting: Family Medicine

## 2023-09-04 VITALS — BP 121/73 | HR 71 | Temp 97.9°F | Ht 63.0 in | Wt 198.0 lb

## 2023-09-04 DIAGNOSIS — E1169 Type 2 diabetes mellitus with other specified complication: Secondary | ICD-10-CM | POA: Diagnosis not present

## 2023-09-04 DIAGNOSIS — Z7985 Long-term (current) use of injectable non-insulin antidiabetic drugs: Secondary | ICD-10-CM

## 2023-09-04 DIAGNOSIS — Z9884 Bariatric surgery status: Secondary | ICD-10-CM

## 2023-09-04 DIAGNOSIS — Z6835 Body mass index (BMI) 35.0-35.9, adult: Secondary | ICD-10-CM

## 2023-09-04 DIAGNOSIS — E66812 Obesity, class 2: Secondary | ICD-10-CM | POA: Diagnosis not present

## 2023-09-04 MED ORDER — TIRZEPATIDE 10 MG/0.5ML ~~LOC~~ SOAJ
10.0000 mg | SUBCUTANEOUS | 0 refills | Status: DC
Start: 2023-09-04 — End: 2023-10-08

## 2023-09-04 NOTE — Progress Notes (Signed)
 Office: [phone]  /  Fax: [phone]  WEIGHT SUMMARY AND BIOMETRICS  Starting Date: 01/01/23  Starting Weight: 214lb   Weight Lost Since Last Visit: 1lb   Vitals Temp: 97.9 F (36.6 C) BP: 121/73 Pulse Rate: 71 SpO2: 98 %   Body Composition  Body Fat %: 44.7 % Fat Mass (lbs): 88.6 lbs Muscle Mass (lbs): 104.2 lbs Total Body Water (lbs): 75.2 lbs Visceral Fat Rating : 14   HPI  Chief Complaint: OBESITY  Teresa Newton is here to discuss her progress with her obesity treatment plan. She is on the the [facility] and states she is following her eating plan approximately 80 % of the time. She states she is exercising 30 minutes 3 times per week.  Interval History:  Since last office visit she is down 3 lb This gives her a net weight loss of 16 lb in the past 8 mos of medically supervised weight management This is a 7.4% TBW loss  She did finish her church fast She is off of sugar and cravings have reduced She did change Ozempic  2 mg to Mounjaro 7.5 mg q week She did lose 1.4 lb of body fat since last visit Energy level is improved post illness and she has added in weights 2 x a week and walking 3 x  week She is not skipping meals and is able to get in more protein with meals  Pharmacotherapy: Mounjaro 7.5 mg weekly  PHYSICAL EXAM:  Blood pressure 121/73, pulse 71, temperature 97.9 F (36.6 C), height 5\' 3"  (1.6 m), weight 198 lb (89.8 kg), SpO2 98%. Body mass index is 35.07 kg/m.  General: She is overweight, cooperative, alert, well developed, and in no acute distress. PSYCH: Has normal mood, affect and thought process.   Lungs: Normal breathing effort, no conversational dyspnea.  ASSESSMENT AND PLAN  TREATMENT PLAN FOR OBESITY:  Recommended Dietary Goals  Teresa Newton is currently in the action stage of change. As such, her goal is to continue weight management plan. She has agreed to keeping a food journal and adhering to recommended goals of 1300  calories and 90 g of  protein and practicing portion control and making smarter food choices, such as increasing vegetables and decreasing simple carbohydrates.  Behavioral Intervention  We discussed the following Behavioral Modification Strategies today: increasing lean protein intake to established goals, increasing fiber rich foods, increasing water intake , work on meal planning and preparation, work on Counselling psychologist calories using tracking application, keeping healthy foods at home, practice mindfulness eating and understand the difference between hunger signals and cravings, work on managing stress, creating time for self-care and relaxation, and continue to work on maintaining a reduced calorie state, getting the recommended amount of protein, incorporating whole foods, making healthy choices, staying well hydrated and practicing mindfulness when eating..  Additional resources provided today: NA  Recommended Physical Activity Goals  Teresa Newton has been advised to work up to 150 minutes of moderate intensity aerobic activity a week and strengthening exercises 2-3 times per week for cardiovascular health, weight loss maintenance and preservation of muscle mass.   She has agreed to Increase the intensity, frequency or duration of aerobic exercises    Pharmacotherapy changes for the treatment of obesity: increase Mounjaro to 10 mg weekly injection  ASSOCIATED CONDITIONS ADDRESSED TODAY  Weight gain status post gastric bypass Improving She is doing well with dietary logging, healthy food choices and regular exercise She has better restriction with food volumes at mealtime on Moujaro  post bastric bypass 10 years ago without GI SE  Type 2 diabetes mellitus with other specified complication, unspecified whether long term insulin  use (HCC) Lab Results  Component Value Date   HGBA1C 6.0 (H) 05/22/2023  Good control of type II diabetes and doing well moving from Ozempic  2 mg to Mounjaro  7.5 mg weekly.  Since she is not having GI SE or skipping meals, will increase Mounjaro to 10 mg weekly  -     Tirzepatide; Inject 10 mg into the skin once a week.  Dispense: 2 mL; Refill: 0  Class 2 severe obesity due to excess calories with serious comorbidity and body mass index (BMI) of 35.0 to 35.9 in adult [facility])      She was informed of the importance of frequent follow up visits to maximize her success with intensive lifestyle modifications for her multiple health conditions.   ATTESTASTION STATEMENTS:  Reviewed by clinician on day of visit: allergies, medications, problem list, medical history, surgical history, family history, social history, and previous encounter notes pertinent to obesity diagnosis.   I have personally spent 30 minutes total time today in preparation, patient care, nutritional counseling and education,  and documentation for this visit, including the following: review of most recent clinical lab tests, prescribing medications/ refilling medications, reviewing medical assistant documentation, review and interpretation of bioimpedence results.     Micky Albee, D.O. DABFM, DABOM [facility] [address] [place], [place] [address] [phone]

## 2023-09-23 ENCOUNTER — Ambulatory Visit: Payer: Medicare PPO | Admitting: Internal Medicine

## 2023-09-23 ENCOUNTER — Encounter: Payer: Self-pay | Admitting: Internal Medicine

## 2023-09-23 VITALS — BP 122/68 | HR 71 | Temp 98.1°F | Ht 63.0 in | Wt 201.8 lb

## 2023-09-23 DIAGNOSIS — J302 Other seasonal allergic rhinitis: Secondary | ICD-10-CM | POA: Diagnosis not present

## 2023-09-23 DIAGNOSIS — E2839 Other primary ovarian failure: Secondary | ICD-10-CM

## 2023-09-23 DIAGNOSIS — Z818 Family history of other mental and behavioral disorders: Secondary | ICD-10-CM

## 2023-09-23 DIAGNOSIS — I10 Essential (primary) hypertension: Secondary | ICD-10-CM

## 2023-09-23 DIAGNOSIS — R413 Other amnesia: Secondary | ICD-10-CM | POA: Diagnosis not present

## 2023-09-23 DIAGNOSIS — E1169 Type 2 diabetes mellitus with other specified complication: Secondary | ICD-10-CM | POA: Diagnosis not present

## 2023-09-23 DIAGNOSIS — E785 Hyperlipidemia, unspecified: Secondary | ICD-10-CM

## 2023-09-23 MED ORDER — LEVOCETIRIZINE DIHYDROCHLORIDE 5 MG PO TABS: 5.0000 mg | ORAL_TABLET | Freq: Every evening | ORAL | 1 refills | Status: DC

## 2023-09-23 NOTE — Progress Notes (Signed)
 I,Victoria T Basil Lim, CMA,acting as a Neurosurgeon for Smiley Dung, MD.,have documented all relevant documentation on the behalf of Smiley Dung, MD,as directed by  Smiley Dung, MD while in the presence of Smiley Dung, MD.  Subjective:  Patient ID: Teresa Newton , female    DOB: 1956/02/24 , 68 y.o.   MRN: [id number]  Chief Complaint  Patient presents with   Hypertension    Pt presents today for a bpc. Pt complains of her allergies acting up and she had the flu. She wants to discuss allergy medicine. Denies headache, chest pain and sob.   Diabetes    HPI Discussed the use of AI scribe software for clinical note transcription with the patient, who gave verbal consent to proceed.  History of Present Illness Teresa Newton is a 68 year old female with hypertension and diabetes who presents for a blood pressure and diabetes check.  She has been experiencing allergy symptoms including sneezing, coughing, watery eyes, and swelling of the eyes. She has been taking Claritin D for these symptoms but finds it expensive and is interested in discussing alternative allergy medications.  She mentions experiencing some memory changes and has a family history of dementia, as her mother had the condition. She has been taking Previgen and notes some improvement, although her memory is inconsistent. Her B12 level was noted to be high in January.  She reports having had the flu in March, which affected her exercise routine. Prior to the flu, she was actively engaged in weight training and other exercises. She plans to resume her exercise regimen once she feels physically ready.  No chest pain, shortness of breath, or urinary issues. She only gets up at night to use the restroom if she drinks water late.  She is currently working and plans to continue for one more year. She recently purchased a townhome in [place] and does not plan to move to [place] .   Diabetes She presents for her follow-up  diabetic visit. She has type 2 diabetes mellitus. Her disease course has been stable. There are no hypoglycemic associated symptoms. Pertinent negatives for diabetes include no blurred vision, no chest pain, no foot paresthesias, no polydipsia and no polyphagia. There are no hypoglycemic complications. Risk factors for coronary artery disease include diabetes mellitus, dyslipidemia, hypertension, obesity, post-menopausal and sedentary lifestyle. She is following a generally healthy and diabetic diet. She never participates in exercise. Her breakfast blood glucose is taken between 7-8 am. Her breakfast blood glucose range is generally 90-110 mg/dl. An ACE inhibitor/angiotensin II receptor blocker is being taken. Eye exam is not current.  Hypertension This is a chronic problem. The current episode started more than 1 year ago. The problem has been gradually improving since onset. The problem is controlled. Pertinent negatives include no blurred vision, chest pain, malaise/fatigue, palpitations or shortness of breath. Risk factors for coronary artery disease include post-menopausal state, obesity, sedentary lifestyle, dyslipidemia and diabetes mellitus. The current treatment provides moderate improvement.     Past Medical History:  Diagnosis Date   ADD (attention deficit disorder)    Back pain    Bilateral edema of lower extremity    Bimalleolar ankle fracture    right   Constipation    Diabetes (HCC)    Edema of both lower legs    Food allergy    GERD (gastroesophageal reflux disease)    Hyperlipidemia    Hypertension    Joint pain    Lactose intolerance  LTBI (latent tuberculosis infection)    Other fatigue    Plaque psoriasis    Pre-diabetes    Shortness of breath on exertion    Sleep apnea    gastric bypass, lost 70lbs and no longer needs CPAP   Stomach ulcer      Family History  Problem Relation Age of Onset   Hypertension Mother    Diabetes Mother    Tuberculosis Mother     Heart Problems Mother    Dementia Mother    Stroke Mother    Alcoholism Father    Ovarian cancer Sister    Psoriasis Brother    Breast cancer Paternal Aunt 41   Alcoholism Other      Current Outpatient Medications:    aspirin 81 MG tablet, Take 81 mg by mouth daily., Disp: , Rfl:    CALCIUM  PO, Take 650 mg by mouth daily., Disp: , Rfl:    Continuous Blood Gluc Sensor (DEXCOM G7 SENSOR) MISC, USE TO CHECK BLOOD SUGARS E11.65, Disp: 3 each, Rfl: 1   COSENTYX 150 MG/ML SOSY, Inject into the skin every 30 (thirty) days., Disp: , Rfl:    Cyanocobalamin  (B-12) 500 MCG TABS, 400- 500 mcg daily, Disp: , Rfl:    ELDERBERRY PO, Take 50 mg by mouth daily., Disp: , Rfl:    EPINEPHrine  0.3 mg/0.3 mL IJ SOAJ injection, Inject 0.3 mg into the muscle as needed for anaphylaxis. INJECT AS DIRECTED FOR ANAPHYLAXIS SHOCK, Disp: 1 each, Rfl: 2   levocetirizine (XYZAL ) 5 MG tablet, Take 1 tablet (5 mg total) by mouth every evening., Disp: 30 tablet, Rfl: 1   lisinopril  (ZESTRIL ) 5 MG tablet, TAKE 1 TABLET (5 MG TOTAL) BY MOUTH DAILY., Disp: 90 tablet, Rfl: 1   Magnesium 250 MG TABS, Take 1 tablet by mouth daily., Disp: , Rfl:    POTASSIUM PO, Take 99 mg by mouth daily., Disp: , Rfl:    Prenatal Vit-Fe Fumarate-FA (PRENATAL PO), Take 2 tablets by mouth daily., Disp: , Rfl:    rosuvastatin  (CRESTOR ) 5 MG tablet, Take 1 tablet (5 mg total) by mouth daily., Disp: 90 tablet, Rfl: 1   tirzepatide (MOUNJARO) 10 MG/0.5ML Pen, Inject 10 mg into the skin once a week., Disp: 2 mL, Rfl: 0   benzonatate  (TESSALON ) 100 MG capsule, Take 1 capsule (100 mg total) by mouth every 8 (eight) hours. (Patient not taking: Reported on 09/23/2023), Disp: 21 capsule, Rfl: 0   Cholecalciferol (D3 5000) 125 MCG (5000 UT) capsule, 5,000 IU every other day (Patient not taking: Reported on 09/23/2023), Disp: , Rfl:    Ginger 500 MG CAPS, Take 2 capsules by mouth daily. (Patient not taking: Reported on 09/23/2023), Disp: , Rfl:    Guaifenesin   1200 MG TB12, Take 1 tablet (1,200 mg total) by mouth 2 (two) times daily at 10 AM and 5 PM. (Patient not taking: Reported on 09/23/2023), Disp: 20 tablet, Rfl: 0   hydrochlorothiazide  (MICROZIDE ) 12.5 MG capsule, TAKE 1 CAPSULE BY MOUTH EVERY DAY (Patient not taking: Reported on 09/23/2023), Disp: 90 capsule, Rfl: 1   meloxicam  (MOBIC ) 15 MG tablet, Take 1 tablet (15 mg total) by mouth daily. (Patient not taking: Reported on 09/23/2023), Disp: 30 tablet, Rfl: 0   Turmeric 500 MG CAPS, Take 2 tablets by mouth daily. (Patient not taking: Reported on 09/23/2023), Disp: , Rfl:    Allergies  Allergen Reactions   Fish Allergy Anaphylaxis    Cod fish    Milk-Related Compounds Anaphylaxis  Goat Milk ONLY    Penicillins Hives     Review of Systems  Constitutional: Negative.  Negative for malaise/fatigue.  HENT:  Positive for congestion, postnasal drip and rhinorrhea.   Eyes:  Negative for blurred vision.  Respiratory: Negative.  Negative for shortness of breath.   Cardiovascular: Negative.  Negative for chest pain and palpitations.  Gastrointestinal: Negative.   Endocrine: Negative for polydipsia and polyphagia.  Neurological: Negative.   Psychiatric/Behavioral: Negative.       Today's Vitals   09/23/23 1056  BP: 122/68  Pulse: 71  Temp: 98.1 F (36.7 C)  SpO2: 98%  Weight: 201 lb 12.8 oz (91.5 kg)  Height: 5\' 3"  (1.6 m)   Body mass index is 35.75 kg/m.  Wt Readings from Last 3 Encounters:  09/23/23 201 lb 12.8 oz (91.5 kg)  09/04/23 198 lb (89.8 kg)  08/05/23 199 lb (90.3 kg)     Objective:  Physical Exam Vitals and nursing note reviewed.  Constitutional:      Appearance: Normal appearance. She is obese.  HENT:     Head: Normocephalic and atraumatic.  Eyes:     Extraocular Movements: Extraocular movements intact.  Cardiovascular:     Rate and Rhythm: Normal rate and regular rhythm.     Heart sounds: Normal heart sounds.  Pulmonary:     Effort: Pulmonary effort is  normal.     Breath sounds: Normal breath sounds.  Musculoskeletal:     Cervical back: Normal range of motion.  Skin:    General: Skin is warm.  Neurological:     General: No focal deficit present.     Mental Status: She is alert.  Psychiatric:        Mood and Affect: Mood normal.        Behavior: Behavior normal.         Assessment And Plan:  Dyslipidemia associated with type 2 diabetes mellitus (HCC) Assessment & Plan: Chronic, currently on Mounjaro as per [facility] provider. She is encouraged to be intentional about her protein intake and to incorporate strength training into her regular exercise routine. She will f/u in four months.    Essential hypertension, benign Assessment & Plan: Chronic, well controlled. She will continue with hydrochlorothiazide  12.5mg  daily along with lisinopril  5mg  daily.  She is reminded to follow low sodium diet.    Seasonal allergies Assessment & Plan: Xyzal  prescribed as a Newton-effective alternative to Claritin D, considering her hypertension. - Prescribe Xyzal  for allergy management. Advise nighttime dosing due to potential for drowsiness. -If symptoms persist, will consider use of steroid NS   Memory changes Assessment & Plan: CT scan planned to assess structural causes. Neurology referral considered due to family history of dementia. - Order CT scan of the brain. - Consider referral to [facility] for further evaluation. -6CIT performed, she score 4.   Orders: -     TSH -     CT HEAD WO CONTRAST ( ); Future -     Ambulatory referral to Neurology  Estrogen deficiency -     DG Bone Density; Future  Family history of dementia  Other orders -     Levocetirizine Dihydrochloride ; Take 1 tablet (5 mg total) by mouth every evening.  Dispense: 30 tablet; Refill: 1   Return for 4 mon f/u for bpc and dm .  Patient was given opportunity to ask questions. Patient verbalized understanding of the plan and was able to repeat key elements  of the plan. All questions were answered  to their satisfaction.    I, Smiley Dung, MD, have reviewed all documentation for this visit. The documentation on 09/23/23 for the exam, diagnosis, procedures, and orders are all accurate and complete.   IF YOU HAVE BEEN REFERRED TO A SPECIALIST, IT MAY TAKE 1-2 WEEKS TO SCHEDULE/PROCESS THE REFERRAL. IF YOU HAVE NOT HEARD FROM US /SPECIALIST IN TWO WEEKS, PLEASE GIVE US  A CALL AT [phone].   THE PATIENT IS ENCOURAGED TO PRACTICE SOCIAL DISTANCING DUE TO THE COVID-19 PANDEMIC.

## 2023-09-23 NOTE — Assessment & Plan Note (Addendum)
 Xyzal  prescribed as a cost-effective alternative to Claritin D, considering her hypertension. - Prescribe Xyzal  for allergy management. Advise nighttime dosing due to potential for drowsiness. -If symptoms persist, will consider use of steroid NS

## 2023-09-23 NOTE — Assessment & Plan Note (Signed)
 Chronic, currently on Mounjaro as per [facility] provider. She is encouraged to be intentional about her protein intake and to incorporate strength training into her regular exercise routine. She will f/u in four months.

## 2023-09-23 NOTE — Assessment & Plan Note (Addendum)
 CT scan planned to assess structural causes. Neurology referral considered due to family history of dementia. - Order CT scan of the brain. - Consider referral to [facility] for further evaluation. -6CIT performed, she score 4.

## 2023-09-23 NOTE — Assessment & Plan Note (Signed)
 Chronic, well controlled. She will continue with hydrochlorothiazide  12.5mg  daily along with lisinopril  5mg  daily.  She is reminded to follow low sodium diet.

## 2023-10-05 ENCOUNTER — Other Ambulatory Visit: Payer: Self-pay | Admitting: Internal Medicine

## 2023-10-05 DIAGNOSIS — R413 Other amnesia: Secondary | ICD-10-CM

## 2023-10-08 ENCOUNTER — Ambulatory Visit: Admitting: Family Medicine

## 2023-10-08 ENCOUNTER — Encounter: Payer: Self-pay | Admitting: Family Medicine

## 2023-10-08 VITALS — BP 115/72 | HR 74 | Temp 97.9°F | Ht 63.0 in | Wt 192.0 lb

## 2023-10-08 DIAGNOSIS — R948 Abnormal results of function studies of other organs and systems: Secondary | ICD-10-CM

## 2023-10-08 DIAGNOSIS — Z6834 Body mass index (BMI) 34.0-34.9, adult: Secondary | ICD-10-CM

## 2023-10-08 DIAGNOSIS — E66811 Obesity, class 1: Secondary | ICD-10-CM | POA: Diagnosis not present

## 2023-10-08 DIAGNOSIS — E1169 Type 2 diabetes mellitus with other specified complication: Secondary | ICD-10-CM | POA: Diagnosis not present

## 2023-10-08 DIAGNOSIS — Z9884 Bariatric surgery status: Secondary | ICD-10-CM

## 2023-10-08 DIAGNOSIS — Z7985 Long-term (current) use of injectable non-insulin antidiabetic drugs: Secondary | ICD-10-CM

## 2023-10-08 DIAGNOSIS — E6609 Other obesity due to excess calories: Secondary | ICD-10-CM

## 2023-10-08 DIAGNOSIS — R635 Abnormal weight gain: Secondary | ICD-10-CM

## 2023-10-08 MED ORDER — TIRZEPATIDE 10 MG/0.5ML ~~LOC~~ SOAJ: 10.0000 mg | SUBCUTANEOUS | 0 refills | Status: DC

## 2023-10-08 NOTE — Progress Notes (Signed)
 Office: [phone]  /  Fax: [phone]  WEIGHT SUMMARY AND BIOMETRICS  Starting Date: 01/01/23  Starting Weight: 214lb   Weight Lost Since Last Visit: 6lb   Vitals Temp: 97.9 F (36.6 C) BP: 115/72 Pulse Rate: 74 SpO2: 98 %   Body Composition  Body Fat %: 43.3 % Fat Mass (lbs): 83.4 lbs Muscle Mass (lbs): 103.6 lbs Total Body Water (lbs): 71.2 lbs Visceral Fat Rating : 13   HPI  Chief Complaint: OBESITY  Teresa Newton is here to discuss her progress with her obesity treatment plan. She is on the the [facility] and states she is following her eating plan approximately 100 % of the time. She states she is exercising 30 minutes 3 times per week.  Interval History:  Since last office visit she is down 6 lb  She did go up on Mounjaro to 10 mg weekly (increased one week ago) Tolerating well without meal skipping or nausea She has an upcoming trip to [place]  with her daughter This gives her a net weight loss of 22 lb in the past 9 mos of medically supervised weight management This is a 10.2% TBW loss She is walking 30 min 3 x a week using a walking pad -- triggered by allergies outdoors She would like to work on increasing veggie intake She is working on improving water intake She is happy to see weight loss post RYGB done in 2014 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 Body mass index is 34.01 kg/m.  General: She is overweight, cooperative, alert, well developed, and in no acute distress. PSYCH: Has normal mood, affect and thought process.   Lungs: Normal breathing effort, no conversational dyspnea.  ASSESSMENT AND PLAN  TREATMENT PLAN FOR OBESITY:  Recommended Dietary Goals  Teresa Newton is currently in the action stage of change. As such, her goal is to  continue weight management plan. She has agreed to keeping a food journal and adhering to recommended goals of 1300 calories and 90+ g of  protein and practicing portion control and making smarter food choices, such as increasing vegetables and decreasing simple carbohydrates.  Behavioral Intervention  We discussed the following Behavioral Modification Strategies today: increasing lean protein intake to established goals, increasing fiber rich foods, increasing water intake , work on meal planning and preparation, keeping healthy foods at home, decreasing eating out or consumption of processed foods, and making healthy choices when eating convenient foods, planning for success, staying on track while traveling and vacationing, and continue to work on maintaining a reduced calorie state, getting the recommended amount of protein, incorporating whole foods, making healthy choices, staying well hydrated and practicing mindfulness when eating..  Additional resources provided today: NA  Recommended Physical Activity Goals  Teresa Newton has been advised to work up to 150 minutes of moderate intensity aerobic activity a week and strengthening exercises 2-3 times per week for cardiovascular health, weight loss maintenance and preservation of muscle mass.   She has agreed to [employer] strengthening exercises with a goal of 2-3 sessions a week  and Increase the intensity, frequency or duration of aerobic exercises   Add in YouTube workout videos, bands, free weights, body weight training from home 2-3 days/ wk.  Keep walking pad 20 + min daily  Pharmacotherapy changes for  the treatment of obesity: none  ASSOCIATED CONDITIONS ADDRESSED TODAY  Type 2 diabetes mellitus with other specified complication, unspecified whether long term insulin  use (HCC) Doing well with dose increase on Mounjaro to 10 mg weekly without meal skipping or GI upset. She has lost >10% of her TBW loss in the past 9 mos of medically supervised  weight management.  She does not check blood sugars at home.  She avoids added sugar and limits refined carbohydrates.  We discussed a plan to ramp up physical activity.  Keep Mounjaro at 10 mg weekly Lab Results  Component Value Date   HGBA1C 6.0 (H) 05/22/2023    -     Tirzepatide; Inject 10 mg into the skin once a week.  Dispense: 2 mL; Refill: 0  Class 1 obesity due to excess calories with serious comorbidity and body mass index (BMI) of 34.0 to 34.9 in adult  Weight gain status post gastric bypass Improving She has been able to avoid the Endostitch procedure that she was looking into last year due to weight regain post gastric bypass surgery Her initial weight goal was <190 lb and she is currently at 192 lb She has improved satiety with Mounjaro and has also been mindful of food choices, protein intake, reducing sugar and excess snacks.    Continue current plan of care Hydrate well with water between meals Continue PNV daily Annual bariatric labs due late Aug  Low basal metabolic rate Initial IC low at 1123 Doing well with weight reduction at 1300 kcal/ day, averaging 5,000+ steps/ day Working on improving sleep, protein intake and movement Plan to repeat fasting IC in the Fall    She was informed of the importance of frequent follow up visits to maximize her success with intensive lifestyle modifications for her multiple health conditions.   ATTESTASTION STATEMENTS:  Reviewed by clinician on day of visit: allergies, medications, problem list, medical history, surgical history, family history, social history, and previous encounter notes pertinent to obesity diagnosis.   I have personally spent 30 minutes total time today in preparation, patient care, nutritional counseling and education,  and documentation for this visit, including the following: review of most recent clinical lab tests, prescribing medications/ refilling medications, reviewing medical assistant  documentation, review and interpretation of bioimpedence results.     Teresa Newton, D.O. DABFM, DABOM [facility] [address] [place], [place] [address] [phone]

## 2023-10-18 ENCOUNTER — Other Ambulatory Visit: Payer: Self-pay | Admitting: Internal Medicine

## 2023-11-13 ENCOUNTER — Ambulatory Visit: Admitting: Family Medicine

## 2023-11-13 ENCOUNTER — Encounter: Payer: Self-pay | Admitting: Family Medicine

## 2023-11-13 VITALS — BP 124/84 | HR 68 | Temp 99.3°F | Ht 63.0 in | Wt 190.0 lb

## 2023-11-13 DIAGNOSIS — E66811 Obesity, class 1: Secondary | ICD-10-CM | POA: Diagnosis not present

## 2023-11-13 DIAGNOSIS — Z7985 Long-term (current) use of injectable non-insulin antidiabetic drugs: Secondary | ICD-10-CM

## 2023-11-13 DIAGNOSIS — Z9884 Bariatric surgery status: Secondary | ICD-10-CM | POA: Diagnosis not present

## 2023-11-13 DIAGNOSIS — Z7984 Long term (current) use of oral hypoglycemic drugs: Secondary | ICD-10-CM | POA: Diagnosis not present

## 2023-11-13 DIAGNOSIS — R635 Abnormal weight gain: Secondary | ICD-10-CM

## 2023-11-13 DIAGNOSIS — E1169 Type 2 diabetes mellitus with other specified complication: Secondary | ICD-10-CM

## 2023-11-13 DIAGNOSIS — Z6833 Body mass index (BMI) 33.0-33.9, adult: Secondary | ICD-10-CM

## 2023-11-13 MED ORDER — TIRZEPATIDE 12.5 MG/0.5ML ~~LOC~~ SOAJ: 12.5000 mg | SUBCUTANEOUS | 1 refills | Status: DC

## 2023-11-13 NOTE — Progress Notes (Signed)
 Office: [phone]  /  Fax: [phone]  WEIGHT SUMMARY AND BIOMETRICS  Starting Date: 01/01/23  Starting Weight: 214lb   Weight Lost Since Last Visit: 2lb   Vitals Temp: 99.3 F (37.4 C) BP: 124/84 Pulse Rate: 68 SpO2: 98 %   Body Composition  Body Fat %: 43.4 % Fat Mass (lbs): 82.6 lbs Muscle Mass (lbs): 102.4 lbs Total Body Water (lbs): 71.4 lbs Visceral Fat Rating : 13    HPI  Chief Complaint: OBESITY  Teresa Newton is here to discuss her progress with her obesity treatment plan. She is on the the [facility] and states she is following her eating plan approximately 100 % of the time. She states she is exercising 30 minutes 5 times per week.  Interval History:  Since last office visit she is down 2 lb She is down 1.2 lb of muscle mass and down 0.8 lb of body fat since last visit This gives her a net weight loss of 24 lb in 10 mos of medically supervised weight management This is an 11.2% TBW loss She denies meal skipping or GI upset on Mounjaro  10 mg weekly She is doing well with lean protein with meals along with fruits and veggies She is walking for workouts inside and outside She has some waning satiety from Mounjaro  at the 10 mg dose She has been consistent with her workouts  Pharmacotherapy: Mounjaro  10 mg weekly injection  PHYSICAL EXAM:  Blood pressure 124/84, pulse 68, temperature 99.3 F (37.4 C), height 5' 3 (1.6 m), weight 190 lb (86.2 kg), SpO2 98%. Body mass index is 33.66 kg/m.  General: She is overweight, cooperative, alert, well developed, and in no acute distress. PSYCH: Has normal mood, affect and thought process.   Lungs: Normal breathing effort, no conversational dyspnea.   ASSESSMENT AND PLAN  TREATMENT PLAN FOR OBESITY:  Recommended Dietary Goals  Melisssa is currently in the action stage of change. As such, her goal is to continue weight management plan. She has agreed to keeping a food journal and adhering to recommended  goals of 1300 calories and 90 g of  protein and following a lower carbohydrate, vegetable and lean protein rich diet plan.  Behavioral Intervention  We discussed the following Behavioral Modification Strategies today: increasing lean protein intake to established goals, increasing fiber rich foods, avoiding skipping meals, increasing water intake , work on meal planning and preparation, keeping healthy foods at home, identifying sources and decreasing liquid calories, practice mindfulness eating and understand the difference between hunger signals and cravings, avoiding temptations and identifying enticing environmental cues, planning for success, and continue to work on maintaining a reduced calorie state, getting the recommended amount of protein, incorporating whole foods, making healthy choices, staying well hydrated and practicing mindfulness when eating..  Additional resources provided today: NA  Recommended Physical Activity Goals  Amillion has been advised to work up to 150 minutes of moderate intensity aerobic activity a week and strengthening exercises 2-3 times per week for cardiovascular health, weight loss maintenance and preservation of muscle mass.   She has agreed to Increase the intensity, frequency or duration of strengthening exercises  and Increase the intensity, frequency or duration of aerobic exercises    Pharmacotherapy changes for the treatment of obesity: increase Mounjaro  to 12.5 mg weekly injection  ASSOCIATED CONDITIONS ADDRESSED TODAY  Type 2 diabetes mellitus with other specified complication, unspecified whether long term insulin  use (HCC) Lab Results  Component Value Date   HGBA1C 6.0 (H) 05/22/2023  She has good control of T2DM  She has done well on Mounjaro  without adverse SE She was previously on Ozempic  2 mg weekly She has done well within 5 lb of her nadir weight post gastric bypass surgery Continue on a low sugar/ low starch diet with regular  exercise Increase Mounjaro  to 12.5 mg weekly -     Tirzepatide ; Inject 12.5 mg into the skin once a week.  Dispense: 2 mL; Refill: 1  Class 1 obesity due to excess calories with body mass index (BMI) of 33.0 to 33.9 in adult, unspecified whether serious comorbidity present  Weight gain status post gastric bypass Doing well with improved food volumes, improved food choices and less snacking on Mounjaro  without GI side effects.  Staying on supplements as directed and annual bariatric surgery labs are UTD.  She has done better improving water intake outside of meal time with a set protein target of ~90 g/ day.  She is 20 lb away from her target weight and 5 lb away from her previous nadir weight     She was informed of the importance of frequent follow up visits to maximize her success with intensive lifestyle modifications for her multiple health conditions.   ATTESTASTION STATEMENTS:  Reviewed by clinician on day of visit: allergies, medications, problem list, medical history, surgical history, family history, social history, and previous encounter notes pertinent to obesity diagnosis.   I have personally spent 25 minutes total time today in preparation, patient care, nutritional counseling and education,  and documentation for this visit, including the following: review of most recent clinical lab tests, prescribing medications/ refilling medications, reviewing medical assistant documentation, review and interpretation of bioimpedence results.     Darice Haddock, D.O. DABFM, DABOM [facility] [address] [place], [place] [address] [phone]

## 2023-11-28 ENCOUNTER — Other Ambulatory Visit: Payer: Self-pay | Admitting: Internal Medicine

## 2023-11-28 DIAGNOSIS — E1169 Type 2 diabetes mellitus with other specified complication: Secondary | ICD-10-CM

## 2023-11-28 DIAGNOSIS — I10 Essential (primary) hypertension: Secondary | ICD-10-CM

## 2023-12-05 ENCOUNTER — Other Ambulatory Visit: Payer: Self-pay | Admitting: Internal Medicine

## 2023-12-05 ENCOUNTER — Telehealth: Payer: Self-pay | Admitting: Family Medicine

## 2023-12-05 DIAGNOSIS — I152 Hypertension secondary to endocrine disorders: Secondary | ICD-10-CM

## 2023-12-05 NOTE — Telephone Encounter (Signed)
 Pt will be going out of town [place]  on Sunday due to work. Pt will be out of town for 3 weeks and requesting a refill on Mounjaro . Please advise

## 2023-12-09 ENCOUNTER — Ambulatory Visit: Admitting: Family Medicine

## 2023-12-10 ENCOUNTER — Ambulatory Visit: Admitting: Family Medicine

## 2023-12-17 ENCOUNTER — Ambulatory Visit: Admitting: Family Medicine

## 2023-12-20 DIAGNOSIS — E11649 Type 2 diabetes mellitus with hypoglycemia without coma: Secondary | ICD-10-CM | POA: Diagnosis not present

## 2023-12-20 DIAGNOSIS — I639 Cerebral infarction, unspecified: Secondary | ICD-10-CM | POA: Diagnosis not present

## 2023-12-20 DIAGNOSIS — R531 Weakness: Secondary | ICD-10-CM | POA: Diagnosis not present

## 2023-12-20 DIAGNOSIS — R29818 Other symptoms and signs involving the nervous system: Secondary | ICD-10-CM | POA: Diagnosis not present

## 2023-12-20 DIAGNOSIS — I6782 Cerebral ischemia: Secondary | ICD-10-CM | POA: Diagnosis not present

## 2023-12-20 DIAGNOSIS — G8194 Hemiplegia, unspecified affecting left nondominant side: Secondary | ICD-10-CM | POA: Diagnosis not present

## 2023-12-20 DIAGNOSIS — G9389 Other specified disorders of brain: Secondary | ICD-10-CM | POA: Diagnosis not present

## 2023-12-20 DIAGNOSIS — R9431 Abnormal electrocardiogram [ECG] [EKG]: Secondary | ICD-10-CM | POA: Diagnosis not present

## 2023-12-20 DIAGNOSIS — Z6834 Body mass index (BMI) 34.0-34.9, adult: Secondary | ICD-10-CM | POA: Diagnosis not present

## 2023-12-20 DIAGNOSIS — I10 Essential (primary) hypertension: Secondary | ICD-10-CM | POA: Diagnosis not present

## 2023-12-20 DIAGNOSIS — E785 Hyperlipidemia, unspecified: Secondary | ICD-10-CM | POA: Diagnosis not present

## 2023-12-20 DIAGNOSIS — E119 Type 2 diabetes mellitus without complications: Secondary | ICD-10-CM | POA: Diagnosis not present

## 2023-12-20 DIAGNOSIS — R200 Anesthesia of skin: Secondary | ICD-10-CM | POA: Diagnosis not present

## 2023-12-20 DIAGNOSIS — E669 Obesity, unspecified: Secondary | ICD-10-CM | POA: Diagnosis not present

## 2023-12-20 DIAGNOSIS — R29706 NIHSS score 6: Secondary | ICD-10-CM | POA: Diagnosis not present

## 2023-12-20 DIAGNOSIS — I69354 Hemiplegia and hemiparesis following cerebral infarction affecting left non-dominant side: Secondary | ICD-10-CM | POA: Diagnosis not present

## 2023-12-20 DIAGNOSIS — I6389 Other cerebral infarction: Secondary | ICD-10-CM | POA: Diagnosis not present

## 2023-12-20 DIAGNOSIS — I051 Rheumatic mitral insufficiency: Secondary | ICD-10-CM | POA: Diagnosis not present

## 2023-12-20 DIAGNOSIS — Z823 Family history of stroke: Secondary | ICD-10-CM | POA: Diagnosis not present

## 2023-12-21 DIAGNOSIS — I6389 Other cerebral infarction: Secondary | ICD-10-CM | POA: Diagnosis not present

## 2023-12-21 DIAGNOSIS — G9389 Other specified disorders of brain: Secondary | ICD-10-CM | POA: Diagnosis not present

## 2023-12-21 DIAGNOSIS — R200 Anesthesia of skin: Secondary | ICD-10-CM | POA: Diagnosis not present

## 2023-12-21 DIAGNOSIS — I69354 Hemiplegia and hemiparesis following cerebral infarction affecting left non-dominant side: Secondary | ICD-10-CM | POA: Diagnosis not present

## 2023-12-21 DIAGNOSIS — E785 Hyperlipidemia, unspecified: Secondary | ICD-10-CM | POA: Diagnosis not present

## 2023-12-21 DIAGNOSIS — E119 Type 2 diabetes mellitus without complications: Secondary | ICD-10-CM | POA: Diagnosis not present

## 2023-12-21 DIAGNOSIS — I10 Essential (primary) hypertension: Secondary | ICD-10-CM | POA: Diagnosis not present

## 2023-12-21 DIAGNOSIS — G8194 Hemiplegia, unspecified affecting left nondominant side: Secondary | ICD-10-CM | POA: Diagnosis not present

## 2023-12-21 LAB — HEMOGLOBIN A1C: Hemoglobin A1C: 5.4

## 2023-12-21 NOTE — Discharge Summary (Addendum)
 Hospitalist Discharge Summary   NAME: Teresa Newton Date of Birth: Dec 17, 1955 Date of admission: 12/20/2023; Admitting Provider: Velma Sharps, DO Date of discharge:  12/21/2023; Discharge Provider: Velma Sharps, DO Primary Care Physician at Discharge: No primary care provider on file. None Code Status at Discharge: Full Code Discharge to: home Discharge Diagnoses: Principal Problem:   Acute CVA (cerebrovascular accident) (CMS/HCC) Active Problems:   Stroke determined by clinical assessment (CMS/HCC)   Details of Hospital Stay   Presenting Problem: Acute CVA (cerebrovascular accident) (CMS/HCC) [I63.9] Stroke determined by clinical assessment (CMS/HCC) [I63.9]  Brief Admission History and Hospital Course:  Teresa Newton is a 68 YO BF from [place], [place] with a PMH of HTN, HLD, diet controlled DMT2, and obesity BMI 33. She presented 8/15 with acute onset of L hemiparesis and numbness. She works as a Surveyor, mining and noticed the symptoms this morning at 630AM so came right to the hospital as she has a very strong family hx of CVA. Prior to this she was in her normal state of health. She does note some intermittent LUE weakness and numbness in the L ulnar nerve distribution in the past, but never any LEs symptoms. She was seen by neuro as a code stroke NIH was 6 and elected to receive TNK. She did well post TNK and seems to be improving. We will admit to the ICU for further care and management. She did very well overnight returning to normal by this AM. MRI brain is without acute findings and she is completely free of symptoms consistent with averted CVA vs presentation was not CVA hard to know. CT brain this AM is also normal repeated for post TNK review. She was rec for DAPT for 21 days then ASA alone from neurology. Cont home statin low dose - lipid panel was appropriate here. Rec for MCOT, but lives in [place] so going to fu with PCP or neurology there about this. Also rec for LUE EMG testing  given presentation here and other similar symptoms in the past in that arm. She has PCP and neuro fu already planned for whenever she returns home.  Discharge Modified Rankin Scale: 0 = No symptoms at all   Active Issues Requiring Follow-up:  # acute CVA- averted with TNK # L hemiparesis- completely resolved - neuro consulted and appreciated d/w them - s/p TNK - NIH 6 on admission - CTs reviewed - MRI brain- no acute findings - TTE - normal no acute findings - ASA/plavix for 21 days then ASA alone - MCOT not ordered as pt doesn't live in this area going back home tomorrow to [facility] - has neuro fu in 2 weeks - needs EMG of the LUE - recurrent symptoms concerning for cubital tunnel compression   # HTN - restart home hydrochlorothiazide    # HLD - cont statin   # DMT2 - diet controlled - check A1c- 5.4% here - monitor for hypoglycemia  Operative Procedures Performed:   Physical Exam at Discharge: Discharge Condition: stable Heart Rate: 72 Resp: (!) 25 BP: 110/63 Temp: 36.5 C (97.7 F) Weight: 86.1 kg (189 lb 13.1 oz) General Appearance: awake, alert, conversant Skin: there are no suspicious lesions or rashes of concern Head/Face: atraumatic  Eyes: EOMI and Sclera nonicteric Neck: supple, no mass, non-tender Lungs: CTAB; no increased work of breathing Heart: RRR no M/R/G clear S1 and S2 Abdomen: soft, non tender, non distended Musculoskeletal: no gross deformity, no edema, able to move all limbs against gravity Neurologic: AOx4;  no gross focal neurologic deficits  Pertinent Information for Overview   Discharge Instructions provided to the patient:  Thank you for letting us  care for you at [facility]! You were admitted to the hospital with concern for a stroke. You had TNK or clot busting drug and had no ill effects from that. Your symptoms completely resolved. This was either an averted stroke or was never a stroke its impossible to know now. We are going to treat it like  a stroke. You do not live in town so a couple of things are needed on follow up- you need an MCOT or cardiac monitor and we think you need an EMG or nerve conduction study in your left arm.. Please return if you have any new or worsening symptoms.  Outpatient Follow-Up: Primary Care Physician at Discharge: No primary care provider on file. None No future appointments.  Discharge Medications:   Your medication list     START taking these medications      Instructions Last Dose Given Next Dose Due  aspirin 81 mg chewable tablet Start taking on: December 22, 2023    Take 1 tablet (81 mg total) by mouth daily.     clopidogreL 75 mg tablet Commonly known as: Plavix    Take 1 tablet (75 mg total) by mouth daily for 21 days.     ondansetron  ODT 4 mg disintegrating tablet Commonly known as: ZOFRAN -ODT    Take 1 tablet (4 mg total) by mouth every 8 (eight) hours as needed (First line for nausea and vomiting) for up to 7 days.         CONTINUE taking these medications      Instructions Last Dose Given Next Dose Due  hydroCHLOROthiazide  12.5 mg capsule Commonly known as: MICROZIDE     Take 1 capsule (12.5 mg total) by mouth daily.     lisinopriL  5 mg tablet Commonly known as: PRINIVIL ,ZESTRIL     Take 1 tablet (5 mg total) by mouth daily.     Mounjaro  12.5 mg/0.5 mL pen injector Generic drug: tirzepatide     Inject 12.5 mg under the skin once a week. Every Saturday     rosuvastatin  5 mg tablet Commonly known as: CRESTOR     Take 1 tablet (5 mg total) by mouth nightly.           Where to Get Your Medications     These medications were sent to [phone] [facility] - [place], [place] - [address]  [address], [place] [address]    Phone: [phone]  aspirin 81 mg chewable tablet clopidogreL 75 mg tablet ondansetron  ODT 4 mg disintegrating tablet     Studies: CT brain without IV contrast Result Date: 12/21/2023 Narrative: HISTORY:  Post TNK; DX:  Cerebral infarction, unspecified  TECHNIQUE: Noncontrast brain CT.  Automated dose control used during this exam.  FINDINGS: Previous exam: Multiple studies from 20 December 2023.  Subcortical and periventricular white matter hypoattenuation likely representing sequela of chronic microangiopathy.  Gray matter, white matter, ventricles, and cisterns are otherwise within normal limits.  There is no acute intracranial hemorrhage, midline shift, or mass-effect. Paranasal sinuses are clear.    Calvarium is intact.     Impression:  No acute findings. Sequela of chronic microangiopathy.     Dictated By:    - 12/21/2023 8:10 AM EDT  Electronically Signed By:  Caprice Hal, MD - 12/21/2023 8:16 AM EDT     MRI stroke brain without contrast (ip only) Result Date:  12/20/2023 Narrative: COMPARISON: CT head 12/20/2023.  HISTORY: here with acute CVA s/p TNK L hemiparesis; DX: Cerebral infarction, unspecified  TECHNIQUE: Multiplanar multisequence MR imaging through the head is provided without IV contrast.  FINDINGS: There is no evidence of restricted diffusion to suggest acute infarction. Sagittal T2 imaging demonstrates normal appearance of the midline structures. There are multifocal FLAIR hyperintensities within a periventricular and subcortical white matter distribution compatible chronic microvascular ischemic change. There is no evidence of intracranial hemorrhage. Paranasal sinuses and mastoid air cells are clear. Susceptibility weighted imaging demonstrates no focal hemosiderin deposition.    Impression: Chronic microvascular ischemic change. No evidence of acute infarction.  Dictated By:    - 12/20/2023 5:07 PM EDT  Electronically Signed By:  Dava Honour, MD - 12/20/2023 5:11 PM EDT     EchoCardiogram with Bubble (TTE COMPLETE) Impression: Interpretation Summary A two-dimensional transthoracic echocardiogram with M-mode and Doppler was performed. Technical Quality:Adequate. There is no comparison study  available. The left ventricular dimension is '4.1 cm' in diastole. The left ventricle is normal in size. The left ventricular volume is 54 ml Relative wall thickness is 0.5. Calculated LV mass is 68 g/m BSA Borderline concentric remodelling. Averaged E to e'= 5.6 . The left ventricular calculated ejection fraction  is 68.2 % Left ventricular ejection fraction is 60-65%. Normal diastolic function. The left ventricular wall motion is normal. The right ventricle is normal in size and function. LAVI is 24 ml/m. The left atrial size is normal. Right atrial size is normal. Injection of contrast documented no interatrial shunt. The mitral valve is normal. There is no mitral valve stenosis. The tricuspid valve is normal. There is no tricuspid stenosis. The calculated pulmonary artery pressure is 20 mm/Hg. Doppler findings do not suggest pulmonary hypertension. The aortic valve is normal in structure. The pulmonic valve leaflets are thin and pliable; valve motion is normal. There is no pulmonic valvular stenosis. No pulmonic valvular regurgitation. The aortic root is normal size. There is no significant pericardial effusion. There is no pleural effusion. There is mild mitral regurgitation. IVC size is normal. Quality was adequate. Bubble Study MMode 2D Measurements & Calculations Doppler Measurements & Calculations [place] Z-Scores(Vital Signs) Classic Z-Scores(Vital Signs) [place] Z-Scores(Vital Signs) Conclusion Normal cardiac chamber sizes. Left ventricular ejection fraction is 60-65%. Normal diastolic function. Borderline LV concentric remodelling. Trace-mild MR. IVC size is normal. Doppler findings do not suggest pulmonary hypertension. Injection of contrast documented no interatrial shunt. Negative bubble study. InterpretingPhysician:  Kathryn Peoples, MD electronically signed on 2023-12-20 [phone]  ECG 12-Lead Impression: Normal sinus rhythm Septal infarct , age undetermined ST & T wave abnormality, consider  inferior ischemia ST & T wave abnormality, consider anterolateral ischemia Abnormal ECG No previous ECGs available Interpreted by Brien MD, Kinsman [id number]) on 12/20/2023 11:14:35 AM  X-ray chest single view portable Result Date: 12/20/2023 Narrative: HISTORY: Stroke acute neurological deficit.  TECHNIQUE:  XR CHEST SINGLE VW PORTABLE  FINDINGS: Comparison: None. No evidence of consolidation, pneumothorax or pleural effusion. Mediastinum is normal. Bony structures are normal.     Impression: No evidence of acute process.  Dictated By:    - 12/20/2023 8:04 AM EDT  Electronically Signed By:  Prentice Potters, MD - 12/20/2023 8:04 AM EDT     CTA brain neck Result Date: 12/20/2023 Narrative: HISTORY:   Neuro deficit, acute, stroke numbness, weakness, hypoglycemia; CVA  TECHNIQUE: CT angiogram head and neck performed using 100 intravenous Isovue -370. MIP reconstructions were performed. Stenosis measurements use NASCET criteria. Images were compared  with the noncontrast study from earlier today.  Automated dose control used during this exam.  0% stenosis per the NASCET guidelines  Where applicable, stenosis measurements are performed per NASCET criteria; with mild (<50%), moderate (50-70%), severe (70-99%).   FINDINGS: Neck: Aortic arch: Proximal arch vessels are widely patent. Mild aortic calcific disease is appreciated. Right carotid: No focal stenosis or intraluminal filling defects. Left carotid: No focal stenosis or intraluminal filling defects. Right vertebral: No focal stenosis or intraluminal filling defects. Left vertebral: No focal stenosis or intraluminal filling defects. No cervical mass or adenopathy.  Head: Posterior communicating arteries: patent . Anterior communicating artery: patent . No major branch occlusion. No focal stenosis. No aneurysm.  No acute intracranial process is appreciated.    Impression: No significant stenosis of the cervical carotid or vertebral arteries.  No acute intracranial  thrombosis or significant intracranial stenosis.    Dictated By:    - 12/20/2023 7:40 AM EDT  Electronically Signed By:  Elspeth Harts, MD - 12/20/2023 7:47 AM EDT     CT brain for acute stroke Result Date: 12/20/2023 Narrative: HISTORY:  Acute neurological deficit.  TECHNIQUE: Noncontrast brain CT.  Automated dose control used during this exam.  FINDINGS:  Previous exam: None. No evidence of acute intracranial hemorrhage, midline shift or mass effect. Nonspecific mild to moderate periventricular white matter hypoattenuation, statistically likely sequelae of chronic microvascular ischemic change. Ventricles and sulci are age-appropriate in their shape, size and configuration. No extra-axial collection. Basilar cisterns preserved. Posterior fossa and foramen magnum intact. Skull intact.  Paranasal sinuses are clear.     Impression:  No evidence of acute intracranial process.     Dictated By:    - 12/20/2023 7:36 AM EDT  Electronically Signed By:  Prentice Potters, MD - 12/20/2023 7:40 AM EDT      For tracking purposes: SmartList Tracker  Velma Sharps, DO  Time of Discharge Process: 34 minutes spent on discharge via coordinating with staff and case management while verifying patient's well-being, speaking with consultants, and in direct patient/family education. See patient instructions above for a brief overview of what was provided and or discussed with the patient to help with transfer of care.

## 2023-12-23 ENCOUNTER — Telehealth: Payer: Self-pay | Admitting: *Deleted

## 2023-12-23 NOTE — Transitions of Care (Post Inpatient/ED Visit) (Signed)
 12/23/2023  Name: Teresa Newton MRN: [id number] DOB: 12/07/1955  Today's TOC FU Call Status: Today's TOC FU Call Status:: Successful TOC FU Call Completed TOC FU Call Complete Date: 12/23/23 Patient's Name and Date of Birth confirmed.  Transition Care Management Follow-up Telephone Call Date of Discharge: 12/21/23 Discharge Facility: Other [facility]) Name of Other (Non[facility]) Discharge Facility: [facility] Type of Discharge: Inpatient Admission Primary Inpatient Discharge Diagnosis:: Cerebral infarction How have you been since you were released from the hospital?: Better Any questions or concerns?: No  Items Reviewed: Did you receive and understand the discharge instructions provided?: Yes Medications obtained,verified, and reconciled?: Yes (Medications Reviewed) Any new allergies since your discharge?: No Dietary orders reviewed?: Yes Type of Diet Ordered:: Heart Healthy Do you have support at home?: Yes People in Home [RPT]: child(ren), adult Name of Support/Comfort Primary Source: Daughter, name not provided  Medications Reviewed Today: Medications Reviewed Today     Reviewed by Lucky Andrea LABOR, RN (Registered Nurse) on 12/23/23 at 1302  Med List Status: <None>   Medication Order Taking? Sig Documenting Provider Last Dose Status Informant  aspirin 81 MG tablet [id number] Yes Take 81 mg by mouth daily. [provider]  Active Self  benzonatate  (TESSALON ) 100 MG capsule [id number]  Take 1 capsule (100 mg total) by mouth every 8 (eight) hours.  Patient not taking: Reported on 12/23/2023   Randol Simmonds, MD  Active   CALCIUM  PO [id number] Yes Take 650 mg by mouth daily. [provider]  Active Self  Cholecalciferol (D3 5000) 125 MCG (5000 UT) capsule [id number] Yes 5,000 IU every other day Midge Sober, DO  Active   clopidogrel (PLAVIX) 75 MG tablet [id number] Yes Take 75 mg by mouth daily. Taking for 21 days, started on 12/23/23 [provider]  Active   Continuous Blood Gluc Sensor (DEXCOM G7 SENSOR) MISC [id number]  USE TO CHECK BLOOD SUGARS E11.65  Patient not taking: Reported on 12/23/2023   Jarold Medici, MD  Active   COSENTYX 150 MG/ML SHERMAN [id number]  Inject into the skin every 30 (thirty) days.  Patient not taking: Reported on 12/23/2023   [provider]  Active   Cyanocobalamin  (B-12) 500 MCG TABS [id number] Yes 400- 500 mcg daily Midge Sober, DO  Active   ELDERBERRY PO [id number]  Take 50 mg by mouth daily.  Patient not taking: Reported on 12/23/2023   [provider]  Active   EPINEPHrine  0.3 mg/0.3 mL IJ SOAJ injection [id number]  Inject 0.3 mg into the muscle as needed for anaphylaxis. INJECT AS DIRECTED FOR ANAPHYLAXIS SHOCK  Patient not taking: Reported on 12/23/2023   Jarold Medici, MD  Active   Ginger 500 MG CAPS [id number]  Take 2 capsules by mouth daily.  Patient not taking: Reported on 12/23/2023   [provider]  Active   Guaifenesin  1200 MG TB12 [id number]  Take 1 tablet (1,200 mg total) by mouth 2 (two) times daily at 10 AM and 5 PM.  Patient not taking: Reported on 12/23/2023   Randol Simmonds, MD  Active   hydrochlorothiazide  (MICROZIDE ) 12.5 MG capsule [id number] Yes TAKE 1 CAPSULE BY MOUTH EVERY DAY Jarold Medici, MD  Active   levocetirizine (XYZAL ) 5 MG tablet [id number] Yes TAKE 1 TABLET BY MOUTH EVERY DAY IN THE [place]AR[place] Jarold Medici, MD  Active   lisinopril  (ZESTRIL ) 5 MG tablet [id number] Yes TAKE 1 TABLET (5 MG TOTAL) BY MOUTH DAILY. Jarold Medici, MD  Active   Magnesium 250 MG  TABS [id number] Yes Take 1 tablet by mouth daily. [provider]  Active   meloxicam  (MOBIC ) 15 MG tablet [id number]  Take 1 tablet (15 mg total) by mouth daily.  Patient not taking: Reported on 12/23/2023   Joya Stabs, [place]  Active   POTASSIUM PO [id number] Yes Take 99 mg by mouth daily. [provider]  Active Self  Prenatal Vit-Fe Fumarate-FA (PRENATAL PO) [id number]  Yes Take 2 tablets by mouth daily. [provider]  Active Self  rosuvastatin  (CRESTOR ) 5 MG tablet [id number] Yes TAKE 1 TABLET (5 MG TOTAL) BY MOUTH DAILY. Jarold Medici, MD  Active   tirzepatide  (MOUNJARO ) 12.5 MG/0.5ML Pen [id number] Yes Inject 12.5 mg into the skin once a week. Bowen, Darice BRAVO, DO  Active   Turmeric 500 MG CAPS [id number]  Take 2 tablets by mouth daily.  Patient not taking: Reported on 12/23/2023   [provider]  Active             Home Care and Equipment/Supplies: Were Home Health Services Ordered?: No Any new equipment or medical supplies ordered?: No  Functional Questionnaire: Do you need assistance with bathing/showering or dressing?: No Do you need assistance with meal preparation?: No Do you need assistance with eating?: No Do you have difficulty maintaining continence: No Do you need assistance with getting out of bed/getting out of a chair/moving?: No Do you have difficulty managing or taking your medications?: No  Follow up appointments reviewed: PCP Follow-up appointment confirmed?: Yes (Patient will call to schedule a hospital follow up for an earlier date.) Date of PCP follow-up appointment?: 01/27/24 Follow-up Provider: Dr. Jarold [facility] Follow-up appointment confirmed?: Yes Date of Specialist follow-up appointment?: 01/13/24 Follow-Up Specialty Provider:: Neurology Do you need transportation to your follow-up appointment?: No Do you understand care options if your condition(s) worsen?: Yes-patient verbalized understanding  SDOH Interventions Today    Flowsheet Row Most Recent Value  SDOH Interventions   Food Insecurity Interventions Intervention Not Indicated  Housing Interventions Intervention Not Indicated  Transportation Interventions Intervention Not Indicated  Utilities Interventions Intervention Not Indicated    Andrea Dimes RN, BSN [place]  [facility] RN Care  Manager [phone]
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
## 2023-12-24 NOTE — Patient Instructions (Signed)
 Stroke Prevention Some medical conditions and behaviors can lead to a higher chance of having a stroke. You can help prevent a stroke by eating healthy, exercising, not smoking, and managing any medical conditions you have. Stroke is a leading cause of functional impairment. Primary prevention is particularly important because a majority of strokes are first-time events. Stroke changes the lives of not only those who experience a stroke but also their family and other caregivers. How can this condition affect me? A stroke is a medical emergency and should be treated right away. A stroke can lead to brain damage and can sometimes be life-threatening. If a person gets medical treatment right away, there is a better chance of surviving and recovering from a stroke. What can increase my risk? The following medical conditions may increase your risk of a stroke: Cardiovascular disease. High blood pressure (hypertension). Diabetes. High cholesterol. Sickle cell disease. Blood clotting disorders (hypercoagulable state). Obesity. Sleep disorders (obstructive sleep apnea). Other risk factors include: Being older than age 66. Having a history of blood clots, stroke, or mini-stroke (transient ischemic attack, TIA). Genetic factors, such as race, ethnicity, or a family history of stroke. Smoking cigarettes or using other tobacco products. Taking birth control pills, especially if you also use tobacco. Heavy use of alcohol or drugs, especially cocaine and methamphetamine. Physical inactivity. What actions can I take to prevent this? Manage your health conditions High cholesterol levels. Eating a healthy diet is important for preventing high cholesterol. If cholesterol cannot be managed through diet alone, you may need to take medicines. Take any prescribed medicines to control your cholesterol as told by your health care provider. Hypertension. To reduce your risk of stroke, try to keep your blood  pressure below 130/80. Eating a healthy diet and exercising regularly are important for controlling blood pressure. If these steps are not enough to manage your blood pressure, you may need to take medicines. Take any prescribed medicines to control hypertension as told by your health care provider. Ask your health care provider if you should monitor your blood pressure at home. Have your blood pressure checked every year, even if your blood pressure is normal. Blood pressure increases with age and some medical conditions. Diabetes. Eating a healthy diet and exercising regularly are important parts of managing your blood sugar (glucose). If your blood sugar cannot be managed through diet and exercise, you may need to take medicines. Take any prescribed medicines to control your diabetes as told by your health care provider. Get evaluated for obstructive sleep apnea. Talk to your health care provider about getting a sleep evaluation if you snore a lot or have excessive sleepiness. Make sure that any other medical conditions you have, such as atrial fibrillation or atherosclerosis, are managed. Nutrition Follow instructions from your health care provider about what to eat or drink to help manage your health condition. These instructions may include: Reducing your daily calorie intake. Limiting how much salt (sodium) you use to 1,500 milligrams (mg) each day. Using only healthy fats for cooking, such as olive oil, canola oil, or sunflower oil. Eating healthy foods. You can do this by: Choosing foods that are high in fiber, such as whole grains, and fresh fruits and vegetables. Eating at least 5 servings of fruits and vegetables a day. Try to fill one-half of your plate with fruits and vegetables at each meal. Choosing lean protein foods, such as lean cuts of meat, poultry without skin, fish, tofu, beans, and nuts. Eating low-fat dairy products. Avoiding  foods that are high in sodium. This can help  lower blood pressure. Avoiding foods that have saturated fat, trans fat, and cholesterol. This can help prevent high cholesterol. Avoiding processed and prepared foods. Counting your daily carbohydrate intake.  Lifestyle If you drink alcohol: Limit how much you have to: 0-1 drink a day for women who are not pregnant. 0-2 drinks a day for men. Know how much alcohol is in your drink. In the [place] one drink equals one 12 oz bottle of beer ( ), one 5 oz glass of wine ( ), or one 1 oz glass of hard liquor (44mL). Do not use any products that contain nicotine or tobacco. These products include cigarettes, chewing tobacco, and vaping devices, such as e-cigarettes. If you need help quitting, ask your health care provider. Avoid secondhand smoke. Do not use drugs. Activity  Try to stay at a healthy weight. Get at least 30 minutes of exercise on most days, such as: Fast walking. Biking. Swimming. Medicines Take over-the-counter and prescription medicines only as told by your health care provider. Aspirin or blood thinners (antiplatelets or anticoagulants) may be recommended to reduce your risk of forming blood clots that can lead to stroke. Avoid taking birth control pills. Talk to your health care provider about the risks of taking birth control pills if: You are over 71 years old. You smoke. You get very bad headaches. You have had a blood clot. Where to find more information American Stroke Association: www.strokeassociation.org Get help right away if: You or a loved one has any symptoms of a stroke. "BE FAST" is an easy way to remember the main warning signs of a stroke: B - Balance. Signs are dizziness, sudden trouble walking, or loss of balance. E - Eyes. Signs are trouble seeing or a sudden change in vision. F - Face. Signs are sudden weakness or numbness of the face, or the face or eyelid drooping on one side. A - Arms. Signs are weakness or numbness in an arm. This happens  suddenly and usually on one side of the body. S - Speech. Signs are sudden trouble speaking, slurred speech, or trouble understanding what people say. T - Time. Time to call emergency services. Write down what time symptoms started. You or a loved one has other signs of a stroke, such as: A sudden, severe headache with no known cause. Nausea or vomiting. Seizure. These symptoms may represent a serious problem that is an emergency. Do not wait to see if the symptoms will go away. Get medical help right away. Call your local emergency services (911 in the [place] Do not drive yourself to the hospital. Summary You can help to prevent a stroke by eating healthy, exercising, not smoking, limiting alcohol intake, and managing any medical conditions you may have. Do not use any products that contain nicotine or tobacco. These include cigarettes, chewing tobacco, and vaping devices, such as e-cigarettes. If you need help quitting, ask your health care provider. Remember "BE FAST" for warning signs of a stroke. Get help right away if you or a loved one has any of these signs. This information is not intended to replace advice given to you by your health care provider. Make sure you discuss any questions you have with your health care provider. Document Revised: 03/26/2022 Document Reviewed: 03/26/2022 Elsevier Patient Education  2024 [employer].

## 2023-12-25 ENCOUNTER — Ambulatory Visit: Payer: Self-pay | Admitting: Internal Medicine

## 2023-12-25 ENCOUNTER — Encounter: Payer: Self-pay | Admitting: Internal Medicine

## 2023-12-25 VITALS — BP 108/78 | HR 87 | Temp 98.0°F | Ht 63.0 in | Wt 194.8 lb

## 2023-12-25 DIAGNOSIS — R413 Other amnesia: Secondary | ICD-10-CM

## 2023-12-25 DIAGNOSIS — E2839 Other primary ovarian failure: Secondary | ICD-10-CM

## 2023-12-25 DIAGNOSIS — E1169 Type 2 diabetes mellitus with other specified complication: Secondary | ICD-10-CM

## 2023-12-25 DIAGNOSIS — I10 Essential (primary) hypertension: Secondary | ICD-10-CM

## 2023-12-25 DIAGNOSIS — Z79899 Other long term (current) drug therapy: Secondary | ICD-10-CM

## 2023-12-25 DIAGNOSIS — R202 Paresthesia of skin: Secondary | ICD-10-CM | POA: Diagnosis not present

## 2023-12-25 DIAGNOSIS — R200 Anesthesia of skin: Secondary | ICD-10-CM | POA: Diagnosis not present

## 2023-12-25 DIAGNOSIS — E1159 Type 2 diabetes mellitus with other circulatory complications: Secondary | ICD-10-CM

## 2023-12-25 DIAGNOSIS — G459 Transient cerebral ischemic attack, unspecified: Secondary | ICD-10-CM | POA: Diagnosis not present

## 2023-12-25 DIAGNOSIS — E785 Hyperlipidemia, unspecified: Secondary | ICD-10-CM | POA: Diagnosis not present

## 2023-12-25 DIAGNOSIS — Z6834 Body mass index (BMI) 34.0-34.9, adult: Secondary | ICD-10-CM

## 2023-12-25 DIAGNOSIS — I639 Cerebral infarction, unspecified: Secondary | ICD-10-CM

## 2023-12-25 LAB — CBC
Hematocrit: 39.8 % (ref 34.0–46.6)
Hemoglobin: 12.4 g/dL (ref 11.1–15.9)
MCH: 29.8 pg (ref 26.6–33.0)
MCHC: 31.2 g/dL — ABNORMAL LOW (ref 31.5–35.7)
MCV: 96 fL (ref 79–97)
Platelets: 260 x10E3/uL (ref 150–450)
RBC: 4.16 x10E6/uL (ref 3.77–5.28)
RDW: 12.9 % (ref 11.7–15.4)
WBC: 4.9 x10E3/uL (ref 3.4–10.8)

## 2023-12-26 ENCOUNTER — Ambulatory Visit: Payer: Self-pay | Admitting: Internal Medicine

## 2023-12-29 DIAGNOSIS — R202 Paresthesia of skin: Secondary | ICD-10-CM | POA: Insufficient documentation

## 2023-12-29 NOTE — Assessment & Plan Note (Signed)
 Chronic, well controlled. She will continue with hydrochlorothiazide  12.5mg  daily along with lisinopril  5mg  daily.  She is reminded to follow low sodium diet.

## 2023-12-29 NOTE — Assessment & Plan Note (Signed)
 Chronic, LDL goal is less than 70. She will continue with Mounjaro  12.5mg  weekly.  - Reminded that adequate protein intake and regular activity is necessary to avoid muscle atrophy.

## 2023-12-29 NOTE — Assessment & Plan Note (Signed)
 Numbness in two fingers prior to TIA. Differential includes possible nerve compression in neck or elbow. - Order EMG and nerve conduction study to assess for nerve compression. - This should be ordered by Neuro since ordering this test at [facility] requires a referral
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
## 2023-12-30 ENCOUNTER — Encounter: Payer: Self-pay | Admitting: Internal Medicine

## 2023-12-30 ENCOUNTER — Encounter: Payer: Self-pay | Admitting: Family Medicine

## 2023-12-30 ENCOUNTER — Ambulatory Visit: Admitting: Family Medicine

## 2023-12-30 VITALS — BP 125/73 | HR 69 | Temp 98.1°F | Ht 63.0 in | Wt 188.0 lb

## 2023-12-30 DIAGNOSIS — Z6833 Body mass index (BMI) 33.0-33.9, adult: Secondary | ICD-10-CM | POA: Diagnosis not present

## 2023-12-30 DIAGNOSIS — E1169 Type 2 diabetes mellitus with other specified complication: Secondary | ICD-10-CM | POA: Diagnosis not present

## 2023-12-30 DIAGNOSIS — E66811 Obesity, class 1: Secondary | ICD-10-CM

## 2023-12-30 DIAGNOSIS — Z9884 Bariatric surgery status: Secondary | ICD-10-CM

## 2023-12-30 DIAGNOSIS — R635 Abnormal weight gain: Secondary | ICD-10-CM

## 2023-12-30 DIAGNOSIS — Z7985 Long-term (current) use of injectable non-insulin antidiabetic drugs: Secondary | ICD-10-CM

## 2023-12-30 DIAGNOSIS — I10 Essential (primary) hypertension: Secondary | ICD-10-CM

## 2023-12-30 MED ORDER — TIRZEPATIDE 12.5 MG/0.5ML ~~LOC~~ SOAJ: 12.5000 mg | SUBCUTANEOUS | 1 refills | Status: DC

## 2023-12-30 NOTE — Progress Notes (Signed)
 Office: [phone]  /  Fax: [phone]  WEIGHT SUMMARY AND BIOMETRICS  Starting Date: 01/01/23  Starting Weight: 214lb   Weight Lost Since Last Visit: 2lb   Vitals Temp: 98.1 F (36.7 C) BP: 125/73 Pulse Rate: 69 SpO2: 100 %   Body Composition  Body Fat %: 43.6 % Fat Mass (lbs): 82.2 lbs Muscle Mass (lbs): 101 lbs Total Body Water (lbs): 70.8 lbs Visceral Fat Rating : 13    HPI  Chief Complaint: OBESITY  Teresa Newton is here to discuss her progress with her obesity treatment plan. She is on the the [facility] and states she is following her eating plan approximately 100 % of the time. She states she is exercising 30-60 minutes 3 times per week.  Interval History:  Since last office visit she is down 2 lb This gives her a net weight loss of 26 lb in 11 mos of medically supervised weight management This is a 12% total body weight loss She was in the ED in [place]  on 8/15 for a TIA with L sided weakness (none now) She is doing well on Mounjaro  12.5 mg weekly without meal skipping She is doing better getting in lean protein with meals and denies meal skipping  She is getting in some fruits and veggies She has upcoming visit with Neuro - Dr Gregg 9/8 She is status post gastric bypass surgery 2014  Pharmacotherapy: Mounjaro  12.5 mg weekly  PHYSICAL EXAM:  Blood pressure 125/73, pulse 69, temperature 98.1 F (36.7 C), height 5' 3 (1.6 m), weight 188 lb (85.3 kg), SpO2 100%. Body mass index is 33.3 kg/m.  General: She is healthy appearing, cooperative, alert, well developed, and in no acute distress. PSYCH: Has normal mood, affect and thought process.   Lungs: Normal breathing effort, no conversational dyspnea.   ASSESSMENT AND PLAN  TREATMENT PLAN FOR OBESITY:  Recommended Dietary Goals  Teresa Newton is currently in the action stage of change. As such, her goal is to continue weight management plan. She has agreed to keeping a food journal and adhering  to recommended goals of 1300 calories and 85+ grams of protein and following a lower carbohydrate, vegetable and lean protein rich diet plan.  Behavioral Intervention  We discussed the following Behavioral Modification Strategies today: increasing lean protein intake to established goals, increasing fiber rich foods, increasing water intake , work on tracking and journaling calories using tracking application, keeping healthy foods at home, work on managing stress, creating time for self-care and relaxation, avoiding temptations and identifying enticing environmental cues, and increase protein intake, fibrous foods (25 grams per day for women, 30 grams for men) and water to improve satiety and decrease hunger signals. .  Additional resources provided today: NA  Recommended Physical Activity Goals  Teresa Newton has been advised to work up to 150 minutes of moderate intensity aerobic activity a week and strengthening exercises 2-3 times per week for cardiovascular health, weight loss maintenance and preservation of muscle mass.   She has agreed to Think about enjoyable ways to increase daily physical activity and overcoming barriers to exercise and Increase physical activity in their day and reduce sedentary time (increase NEAT). Light walking 30 minutes 5 days a day okay  Pharmacotherapy changes for the treatment of obesity: None  ASSOCIATED CONDITIONS ADDRESSED TODAY  Weight gain status post gastric bypass Improving.  Continue a reduced calorie about 1300/day to include adequate lean protein and fiber with meals.  Continue to limit added sugar and refined carbohydrates.  She is  happy with her weight loss progress.  Type 2 diabetes mellitus with other specified complication, unspecified whether long term insulin  use (HCC) Lab Results  Component Value Date   HGBA1C 6.0 (H) 05/22/2023    She has adequate improvement in satiety on Mounjaro  12.5 mg once daily.  She denies hypoglycemia or meal  skipping.  She denies GI upset.  She is doing well reducing added sugar and refined carbohydrates.  She may add in some light walking. -     Tirzepatide ; Inject 12.5 mg into the skin once a week.  Dispense: 2 mL; Refill: 1  Class 1 obesity due to excess calories with body mass index (BMI) of 33.0 to 33.9 in adult, unspecified whether serious comorbidity present Improving. Reviewed bioimpedance results  Essential hypertension Blood pressure is well-controlled today currently on HCTZ 12.5 mg daily, lisinopril  5 mg daily.  Noted her recent TIA from the ED in [place]  from 8/15 with notes reviewed.  She has been back to see her PCP and is scheduled for neurology visit.  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  She was treated with TNK and denies any current issues with weakness or numbness.  Keep upcoming visits.  Avoid any drastic decreases in exercise including cardio or resistance until cleared by neurology.   She was informed of the importance of frequent follow up visits to maximize her success with intensive lifestyle modifications for her multiple health conditions.   ATTESTASTION STATEMENTS:  Reviewed by clinician on day of visit: allergies, medications, problem list, medical history, surgical history, family history, social history, and previous encounter notes pertinent to obesity diagnosis.   I have personally spent 35 minutes total time today in preparation, patient care, nutritional counseling and education,  and documentation for this visit, including the following: review of most recent clinical lab tests, prescribing medications/ refilling medications, reviewing medical assistant documentation, review and interpretation of bioimpedence results.     Teresa Newton, D.O. DABFM, DABOM [facility] [address] [place], [place] [address] [phone]

## 2023-12-30 NOTE — Patient Instructions (Addendum)
 Keep upcoming visit with Neurology  Stay on Mounjaro  12.5 mg weekly  Aim for 30 min of walking 3-4 days/ wk  1200 cal/ day goal -- you are welcome to track on the MyNet Diary app Aim for ~85 g of protein and 20-30 g fiber

## 2024-01-08 ENCOUNTER — Ambulatory Visit: Payer: Medicare PPO

## 2024-01-08 VITALS — BP 118/64 | HR 75 | Temp 97.6°F | Ht 63.0 in | Wt 193.2 lb

## 2024-01-08 DIAGNOSIS — Z0000 Encounter for general adult medical examination without abnormal findings: Secondary | ICD-10-CM

## 2024-01-08 NOTE — Progress Notes (Signed)
 Subjective:   Teresa Newton is a 68 y.o. who presents for a Medicare Wellness preventive visit.  As a reminder, Annual Wellness Visits don't include a physical exam, and some assessments may be limited, especially if this visit is performed virtually. We may recommend an in-person follow-up visit with your provider if needed.  Visit Complete: In person    Persons Participating in Visit: Patient.  AWV Questionnaire: No: Patient Medicare AWV questionnaire was not completed prior to this visit.  Cardiac Risk Factors include: advanced age (>78men, >90 women);dyslipidemia;hypertension;obesity (BMI >30kg/m2)     Objective:    Today's Vitals   01/08/24 0942  BP: 118/64  Pulse: 75  Temp: 97.6 F (36.4 C)  TempSrc: Oral  SpO2: 98%  Weight: 193 lb 3.2 oz (87.6 kg)  Height: 5' 3 (1.6 m)   Body mass index is 34.22 kg/m.     01/08/2024    9:49 AM 06/30/2023    4:29 PM 02/08/2023    9:11 AM 01/02/2023    9:20 AM 08/01/2022    9:01 AM 04/19/2022   10:19 AM 11/15/2021   12:19 PM  Advanced Directives  Does Patient Have a Medical Advance Directive? No No No No No No No  Would patient like information on creating a medical advance directive?   No - Patient declined  No - Patient declined      Current Medications (verified) Outpatient Encounter Medications as of 01/08/2024  Medication Sig   aspirin 81 MG tablet Take 81 mg by mouth daily.   CALCIUM  PO Take 650 mg by mouth daily.   Cholecalciferol (D3 5000) 125 MCG (5000 UT) capsule 5,000 IU every other day   clopidogrel (PLAVIX) 75 MG tablet Take 75 mg by mouth daily. Taking for 21 days, started on 12/23/23   COSENTYX 150 MG/ML SOSY Inject into the skin every 30 (thirty) days.   Cyanocobalamin  (B-12) 500 MCG TABS 400- 500 mcg daily   EPINEPHrine  0.3 mg/0.3 mL IJ SOAJ injection Inject 0.3 mg into the muscle as needed for anaphylaxis. INJECT AS DIRECTED FOR ANAPHYLAXIS SHOCK   hydrochlorothiazide  (MICROZIDE ) 12.5 MG capsule TAKE 1  CAPSULE BY MOUTH EVERY DAY   levocetirizine (XYZAL ) 5 MG tablet TAKE 1 TABLET BY MOUTH EVERY DAY IN THE EVENING   lisinopril  (ZESTRIL ) 5 MG tablet TAKE 1 TABLET (5 MG TOTAL) BY MOUTH DAILY.   Magnesium 250 MG TABS Take 1 tablet by mouth daily.   POTASSIUM PO Take 99 mg by mouth daily.   Prenatal Vit-Fe Fumarate-FA (PRENATAL PO) Take 2 tablets by mouth daily.   rosuvastatin  (CRESTOR ) 5 MG tablet TAKE 1 TABLET (5 MG TOTAL) BY MOUTH DAILY.   tirzepatide  (MOUNJARO ) 12.5 MG/0.5ML Pen Inject 12.5 mg into the skin once a week.   benzonatate  (TESSALON ) 100 MG capsule Take 1 capsule (100 mg total) by mouth every 8 (eight) hours. (Patient not taking: Reported on 01/08/2024)   Continuous Blood Gluc Sensor (DEXCOM G7 SENSOR) MISC USE TO CHECK BLOOD SUGARS E11.65 (Patient not taking: Reported on 01/08/2024)   ELDERBERRY PO Take 50 mg by mouth daily. (Patient not taking: Reported on 01/08/2024)   Ginger 500 MG CAPS Take 2 capsules by mouth daily. (Patient not taking: Reported on 01/08/2024)   Guaifenesin  1200 MG TB12 Take 1 tablet (1,200 mg total) by mouth 2 (two) times daily at 10 AM and 5 PM. (Patient not taking: Reported on 01/08/2024)   meloxicam  (MOBIC ) 15 MG tablet Take 1 tablet (15 mg total) by mouth daily. (  Patient not taking: Reported on 01/08/2024)   Turmeric 500 MG CAPS Take 2 tablets by mouth daily. (Patient not taking: Reported on 01/08/2024)   No facility-administered encounter medications on file as of 01/08/2024.    Allergies (verified) Fish allergy, Milk-related compounds, and Penicillins   History: Past Medical History:  Diagnosis Date   ADD (attention deficit disorder)    Back pain    Bilateral edema of lower extremity    Bimalleolar ankle fracture    right   Constipation    Diabetes (HCC)    Edema of both lower legs    Food allergy    GERD (gastroesophageal reflux disease)    Hyperlipidemia    Hypertension    Joint pain    Lactose intolerance    LTBI (latent tuberculosis infection)     Other fatigue    Plaque psoriasis    Pre-diabetes    Shortness of breath on exertion    Sleep apnea    gastric bypass, lost 70lbs and no longer needs CPAP   Stomach ulcer    Past Surgical History:  Procedure Laterality Date   COLONOSCOPY     FOOT SURGERY Right    GASTRIC BYPASS     ORIF ANKLE FRACTURE Right 01/30/2018   Procedure: OPEN REDUCTION INTERNAL FIXATION (ORIF) right ankle trimalleolar fracture;  Surgeon: Kit Rush, MD;  Location: [facility];  Service: Orthopedics;  Laterality: Right;   TUBAL LIGATION     Family History  Problem Relation Age of Onset   Hypertension Mother    Diabetes Mother    Tuberculosis Mother    Heart Problems Mother    Dementia Mother    Stroke Mother    Alcoholism Father    Ovarian cancer Sister    Psoriasis Brother    Breast cancer Paternal Aunt 97   Alcoholism Other    Social History   Socioeconomic History   Marital status: Widowed    Spouse name: Not on file   Number of children: 4   Years of education: Not on file   Highest education level: Not on file  Occupational History   Occupation: Midwife    Comment: Retired, 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  Tobacco Use   Smoking status: Former    Current packs/day: 0.00    Average packs/day: 1 pack/day for 4.0 years (4.0 ttl pk-yrs)    Types: Cigarettes    Start date: 05/07/1972    Quit date: 05/06/1976    Years since quitting: 47.7   Smokeless tobacco: Never  Vaping Use   Vaping status: Never Used  Substance and Sexual Activity   Alcohol use: No    Alcohol/week: 0.0 standard drinks of alcohol   Drug use: No   Sexual activity: Not Currently    Partners: Male  Other Topics Concern   Not on file  Social History Narrative   Not on file   Social Drivers of Health   Financial Resource Strain: Low Risk  (01/08/2024)   Overall Financial Resource Strain (CARDIA)    Difficulty of Paying Living Expenses: Not hard at all  Food Insecurity: No Food  Insecurity (01/08/2024)   Hunger Vital Sign    Worried About Running Out of Food in the Last Year: Never true    Ran Out of Food in the Last Year: Never true  Transportation Needs: No Transportation Needs (01/08/2024)   PRAPARE - Administrator, Civil Service (Medical): No  Lack of Transportation (Non-Medical): No  Physical Activity: Insufficiently Active (01/08/2024)   Exercise Vital Sign    Days of Exercise per Week: 3 days    Minutes of Exercise per Session: 20 min  Stress: No Stress Concern Present (01/08/2024)   [employer] of Occupational Health - Occupational Stress Questionnaire    Feeling of Stress: Not at all  Social Connections: Moderately Isolated (01/08/2024)   Social Connection and Isolation Panel    Frequency of Communication with Friends and Family: More than three times a week    Frequency of Social Gatherings with Friends and Family: More than three times a week    Attends Religious Services: More than 4 times per year    Active Member of [employer] or Organizations: No    Attends Banker Meetings: Never    Marital Status: Widowed    Tobacco Counseling Counseling given: Not Answered    Clinical Intake:  Pre-visit preparation completed: Yes  Pain : No/denies pain     Nutritional Status: BMI > 30  Obese Nutritional Risks: Nausea/ vomitting/ diarrhea (nausea yesterday, resolved) Diabetes: Yes CBG done?: No Did pt. bring in CBG monitor from home?: No  Lab Results  Component Value Date   HGBA1C 6.0 (H) 05/22/2023   HGBA1C 6.0 (H) 01/01/2023   HGBA1C 6.3 (H) 08/16/2022     How often do you need to have someone help you when you read instructions, pamphlets, or other written materials from your doctor or pharmacy?: 1 - Never  Interpreter Needed?: No  Information entered by :: NAllen LPN   Activities of Daily Living     01/08/2024    9:44 AM  In your present state of health, do you have any difficulty performing the following  activities:  Hearing? 0  Vision? 0  Difficulty concentrating or making decisions? 0  Walking or climbing stairs? 0  Dressing or bathing? 0  Doing errands, shopping? 0  Preparing Food and eating ? N  Using the Toilet? N  In the past six months, have you accidently leaked urine? N  Do you have problems with loss of bowel control? N  Managing your Medications? N  Managing your Finances? N  Housekeeping or managing your Housekeeping? N    Patient Care Team: Jarold Medici, MD as PCP - [facility]  I have updated your Care Teams any recent Medical Services you may have received from other providers in the past year.     Assessment:   This is a routine wellness examination for Teresa Newton.  Hearing/Vision screen Hearing Screening - Comments:: Denies hearing issues Vision Screening - Comments:: Regular eye exams. [facility]   Goals Addressed             This Visit's Progress    Patient Stated       01/08/2024, increase exercise       Depression Screen     01/08/2024    9:51 AM 12/23/2023   12:59 PM 09/23/2023   11:00 AM 05/22/2023    2:09 PM 01/02/2023    9:21 AM 08/16/2022    9:05 AM 08/01/2022    9:01 AM  PHQ 2/9 Scores  PHQ - 2 Score 0 0 0 0 0 0 0  PHQ- 9 Score 0  0 3 0 0     Fall Risk     01/08/2024    9:50 AM 12/25/2023    8:42 AM 09/23/2023   11:00 AM 05/22/2023  2:09 PM 01/02/2023    9:21 AM  Fall Risk   Falls in the past year? 0 0 0 0 0  Number falls in past yr: 0 0 0 0 0  Injury with Fall? 0 0 0 0 0  Risk for fall due to : Medication side effect No Fall Risks No Fall Risks No Fall Risks Medication side effect  Follow up Falls evaluation completed;Falls prevention discussed Falls evaluation completed Falls evaluation completed Falls evaluation completed Falls prevention discussed;Falls evaluation completed    MEDICARE RISK AT HOME:  Medicare Risk at Home Any stairs in or around the home?: Yes If so, are there  any without handrails?: No Home free of loose throw rugs in walkways, pet beds, electrical cords, etc?: Yes Adequate lighting in your home to reduce risk of falls?: Yes Life alert?: No Use of a cane, walker or w/c?: No Grab bars in the bathroom?: No Shower chair or bench in shower?: No Elevated toilet seat or a handicapped toilet?: Yes  TIMED UP AND GO:  Was the test performed?  Yes  Length of time to ambulate 10 feet: 5 sec Gait steady and fast without use of assistive device  Cognitive Function: 6CIT completed        01/08/2024    9:52 AM 09/23/2023   11:40 AM 01/02/2023    9:22 AM 11/15/2021   12:21 PM  6CIT Screen  What Year? 0 points 0 points 0 points 0 points  What month? 0 points 0 points 0 points 0 points  What time? 0 points 0 points 0 points 0 points  Count back from 20 0 points 0 points 0 points 0 points  Months in reverse 0 points 2 points 0 points 0 points  Repeat phrase 0 points 2 points 6 points 4 points  Total Score 0 points 4 points 6 points 4 points    Immunizations Immunization History  Administered Date(s) Administered   PFIZER(Purple Top)SARS-COV-2 Vaccination 07/03/2019, 07/25/2019   Pneumococcal Polysaccharide-23 11/21/2020   Tdap 05/04/2019    Screening Tests Health Maintenance  Topic Date Due   HEMOGLOBIN A1C  11/19/2023   COVID-19 Vaccine (3 - Pfizer risk series) 01/24/2024 (Originally 08/22/2019)   Zoster Vaccines- Shingrix (1 of 2) 03/26/2024 (Originally 11/29/1974)   INFLUENZA VACCINE  08/04/2024 (Originally 12/06/2023)   Pneumococcal Vaccine: 50+ Years (2 of 2 - PCV) 09/22/2024 (Originally 11/21/2021)   OPHTHALMOLOGY EXAM  02/20/2024   Diabetic kidney evaluation - eGFR measurement  05/21/2024   Diabetic kidney evaluation - Urine ACR  05/21/2024   FOOT EXAM  05/21/2024   Medicare Annual Wellness (AWV)  01/07/2025   MAMMOGRAM  05/08/2025   DTaP/Tdap/Td (2 - Td or Tdap) 05/03/2029   Colonoscopy  03/10/2030   DEXA SCAN  Completed   Hepatitis  C Screening  Completed   HPV VACCINES  Aged Out   Meningococcal B Vaccine  Aged Out    Health Maintenance  Health Maintenance Due  Topic Date Due   HEMOGLOBIN A1C  11/19/2023   Health Maintenance Items Addressed: Declines vaccines  Additional Screening:  Vision Screening: Recommended annual ophthalmology exams for early detection of glaucoma and other disorders of the eye. Would you like a referral to an eye doctor? No    Dental Screening: Recommended annual dental exams for proper oral hygiene  Community Resource Referral / Chronic Care Management: CRR required this visit?  No   CCM required this visit?  No   Plan:    I have personally reviewed  and noted the following in the patient's chart:   Medical and social history Use of alcohol, tobacco or illicit drugs  Current medications and supplements including opioid prescriptions. Patient is not currently taking opioid prescriptions. Functional ability and status Nutritional status Physical activity Advanced directives List of other physicians Hospitalizations, surgeries, and ER visits in previous 12 months Vitals Screenings to include cognitive, depression, and falls Referrals and appointments  In addition, I have reviewed and discussed with patient certain preventive protocols, quality metrics, and best practice recommendations. A written personalized care plan for preventive services as well as general preventive health recommendations were provided to patient.   Teresa Newton Dawn, LPN   [id number]   After Visit Summary: (In Person-Declined) Patient declined AVS at this time.  Notes: Nothing significant to report at this time.

## 2024-01-08 NOTE — Patient Instructions (Signed)
 Teresa Newton , Thank you for taking time out of your busy schedule to complete your Annual Wellness Visit with me. I enjoyed our conversation and look forward to speaking with you again next year. I, as well as your care team,  appreciate your ongoing commitment to your health goals. Please review the following plan we discussed and let me know if I can assist you in the future. Your Game plan/ To Do List    Referrals: If you haven't heard from the office you've been referred to, please reach out to them at the phone provided.   Follow up Visits: We will see or speak with you next year for your Next Medicare AWV with our clinical staff Have you seen your provider in the last 6 months (3 months if uncontrolled diabetes)? Yes  Clinician Recommendations:  Aim for 30 minutes of exercise or brisk walking, 6-8 glasses of water, and 5 servings of fruits and vegetables each day.       This is a list of the screenings recommended for you:  Health Maintenance  Topic Date Due   Hemoglobin A1C  11/19/2023   COVID-19 Vaccine (3 - Pfizer risk series) 01/24/2024*   Zoster (Shingles) Vaccine (1 of 2) 03/26/2024*   Flu Shot  08/04/2024*   Pneumococcal Vaccine for age over 35 (2 of 2 - PCV) 09/22/2024*   Eye exam for diabetics  02/20/2024   Yearly kidney function blood test for diabetes  05/21/2024   Yearly kidney health urinalysis for diabetes  05/21/2024   Complete foot exam   05/21/2024   Medicare Annual Wellness Visit  01/07/2025   Mammogram  05/08/2025   DTaP/Tdap/Td vaccine (2 - Td or Tdap) 05/03/2029   Colon Cancer Screening  03/10/2030   DEXA scan (bone density measurement)  Completed   Hepatitis C Screening  Completed   HPV Vaccine  Aged Out   Meningitis B Vaccine  Aged Out  *Topic was postponed. The date shown is not the original due date.    Advanced directives: (Copy Requested) Please bring a copy of your health care power of attorney and living will to the office to be added to your  chart at your convenience. You can mail to [facility] [address] [place], [place] [address] or email to [email or web address] Advance Care Planning is important because it:  [x]  Makes sure you receive the medical care that is consistent with your values, goals, and preferences  [x]  It provides guidance to your family and loved ones and reduces their decisional burden about whether or not they are making the right decisions based on your wishes.  Follow the link provided in your after visit summary or read over the paperwork we have mailed to you to help you started getting your Advance Directives in place. If you need assistance in completing these, please reach out to us  so that we can help you!  See attachments for Preventive Care and Fall Prevention Tips.

## 2024-01-09 ENCOUNTER — Telehealth: Payer: Self-pay | Admitting: Neurology

## 2024-01-09 NOTE — Telephone Encounter (Signed)
 Called pt to confirm appt 9/8. Pt states she has had a memory test done in [place]. States she wants to keep appt and her Dr. should have sent records over from having this test done.

## 2024-01-13 ENCOUNTER — Ambulatory Visit: Admitting: Neurology

## 2024-01-13 ENCOUNTER — Encounter: Payer: Self-pay | Admitting: Neurology

## 2024-01-13 VITALS — BP 115/75 | HR 82 | Ht 63.0 in | Wt 193.0 lb

## 2024-01-13 DIAGNOSIS — G459 Transient cerebral ischemic attack, unspecified: Secondary | ICD-10-CM | POA: Diagnosis not present

## 2024-01-13 DIAGNOSIS — R413 Other amnesia: Secondary | ICD-10-CM | POA: Diagnosis not present

## 2024-01-13 NOTE — Progress Notes (Signed)
 [facility]  PATIENT: Teresa Newton DOB: [birth year]  REQUESTING CLINICIAN: Jarold Medici, MD HISTORY FROM: Patient  REASON FOR VISIT: Memory loss    HISTORICAL  CHIEF COMPLAINT:  Chief Complaint  Patient presents with   New Patient (Initial Visit)    Pt in room 12.alone. Internal referral for Memory changes. Pt has TIA on 12/20/23. Family history of alzheimer's disease. MOCA:23    HISTORY OF PRESENT ILLNESS:  Discussed the use of AI scribe software for clinical note transcription with the patient, who gave verbal consent to proceed.  Teresa Newton is a 68 year old female with history of recent TIA, hypertension, hyperlipidemia, diabetes who presents with concerns about memory loss.  She is experiencing a decline in memory, particularly in remembering things as she used to. She denies that others have noticed or complained about her memory, stating 'it's just me.' She lives with her daughter, who has not mentioned any memory issues. She has no difficulty with activities of daily living such as cooking or handling bills, and reports that her bills are paid automatically. She can remember the names of family members but sometimes stumbles over her words.  She has a history of a transient ischemic attack (TIA) that occurred on August 14th of this year. She received thrombolytic therapy, and was told by her doctors that everything looked good afterwards. Her family history is significant for her mother and grandmother both dying of strokes, and her mother was diagnosed with Alzheimer's disease in her late 52.  Her past medical history includes sleep apnea, for which she previously used a CPAP machine but no longer does. She also experienced anxiety and depression following her husband's death four years ago, but she did not take medication for long.  In terms of social history, she does not engage in regular exercise but acknowledges that walking counts as  exercise. She engages in mental exercises such as word searches, puzzles, and reading, which she finds beneficial.    TBI:  No past history of TBI Stroke: History of TIA Seizures:  no past history of seizures Sleep: History of sleep apnea but not using CPAP no history of sleep apnea.   Mood: Some depressive symptoms after loss of husband   depression Family history of Dementia: Mother with dementia in her late 67  Functional status: independent in all ADLs and IADLs Patient lives with her daughter. Cooking: no issues Cleaning: no issues Shopping: no issues Bathing: no issues  Toileting: no issues Driving: no issues  Bills: no issues Medications: no issues Ever left the stove on by accident?: denies Forget how to use items around the house?: denies Getting lost going to familiar places?: denies Forgetting loved ones names?: denies Word finding difficulty? Yes  Sleep: Good    OTHER MEDICAL CONDITIONS: Recent TIA, Hypertension, Hyperlipidemia, Diabetes    REVIEW OF SYSTEMS: Full 14 system review of systems performed and negative with exception of: As noted in the HPI   ALLERGIES: Allergies  Allergen Reactions   Fish Allergy Anaphylaxis    Cod fish    Milk-Related Compounds Anaphylaxis    Goat Milk ONLY    Penicillins Hives    HOME MEDICATIONS: Outpatient Medications Prior to Visit  Medication Sig Dispense Refill   aspirin 81 MG tablet Take 81 mg by mouth daily.     benzonatate  (TESSALON ) 100 MG capsule Take 1 capsule (100 mg total) by mouth every 8 (eight) hours. 21 capsule 0   CALCIUM  PO Take 650  mg by mouth daily.     Cholecalciferol (D3 5000) 125 MCG (5000 UT) capsule 5,000 IU every other day     Continuous Blood Gluc Sensor (DEXCOM G7 SENSOR) MISC USE TO CHECK BLOOD SUGARS E11.65 3 each 1   COSENTYX 150 MG/ML SOSY Inject into the skin every 30 (thirty) days.     Cyanocobalamin  (B-12) 500 MCG TABS 400- 500 mcg daily     ELDERBERRY PO Take 50 mg by mouth daily.      EPINEPHrine  0.3 mg/0.3 mL IJ SOAJ injection Inject 0.3 mg into the muscle as needed for anaphylaxis. INJECT AS DIRECTED FOR ANAPHYLAXIS SHOCK 1 each 2   hydrochlorothiazide  (MICROZIDE ) 12.5 MG capsule TAKE 1 CAPSULE BY MOUTH EVERY DAY 90 capsule 1   levocetirizine (XYZAL ) 5 MG tablet TAKE 1 TABLET BY MOUTH EVERY DAY IN THE EVENING 90 tablet 1   lisinopril  (ZESTRIL ) 5 MG tablet TAKE 1 TABLET (5 MG TOTAL) BY MOUTH DAILY. 90 tablet 1   Magnesium 250 MG TABS Take 1 tablet by mouth daily.     meloxicam  (MOBIC ) 15 MG tablet Take 1 tablet (15 mg total) by mouth daily. 30 tablet 0   POTASSIUM PO Take 99 mg by mouth daily.     Prenatal Vit-Fe Fumarate-FA (PRENATAL PO) Take 2 tablets by mouth daily.     rosuvastatin  (CRESTOR ) 5 MG tablet TAKE 1 TABLET (5 MG TOTAL) BY MOUTH DAILY. 90 tablet 1   tirzepatide  (MOUNJARO ) 12.5 MG/0.5ML Pen Inject 12.5 mg into the skin once a week. 2 mL 1   clopidogrel (PLAVIX) 75 MG tablet Take 75 mg by mouth daily. Taking for 21 days, started on 12/23/23 (Patient not taking: Reported on 01/13/2024)     Ginger 500 MG CAPS Take 2 capsules by mouth daily. (Patient not taking: Reported on 01/13/2024)     Guaifenesin  1200 MG TB12 Take 1 tablet (1,200 mg total) by mouth 2 (two) times daily at 10 AM and 5 PM. (Patient not taking: Reported on 01/13/2024) 20 tablet 0   Turmeric 500 MG CAPS Take 2 tablets by mouth daily. (Patient not taking: Reported on 01/13/2024)     No facility-administered medications prior to visit.    PAST MEDICAL HISTORY: Past Medical History:  Diagnosis Date   ADD (attention deficit disorder)    Back pain    Bilateral edema of lower extremity    Bimalleolar ankle fracture    right   Constipation    Diabetes (HCC)    Edema of both lower legs    Food allergy    GERD (gastroesophageal reflux disease)    Hyperlipidemia    Hypertension    Joint pain    Lactose intolerance    LTBI (latent tuberculosis infection)    Other fatigue    Plaque psoriasis     Pre-diabetes    Shortness of breath on exertion    Sleep apnea    gastric bypass, lost 70lbs and no longer needs CPAP   Stomach ulcer     PAST SURGICAL HISTORY: Past Surgical History:  Procedure Laterality Date   COLONOSCOPY     FOOT SURGERY Right    GASTRIC BYPASS     ORIF ANKLE FRACTURE Right 01/30/2018   Procedure: OPEN REDUCTION INTERNAL FIXATION (ORIF) right ankle trimalleolar fracture;  Surgeon: Kit Rush, MD;  Location: [facility];  Service: Orthopedics;  Laterality: Right;   TUBAL LIGATION      FAMILY HISTORY: Family History  Problem Relation Age of Onset  Hypertension Mother    Diabetes Mother    Tuberculosis Mother    Heart Problems Mother    Dementia Mother    Stroke Mother    Alcoholism Father    Ovarian cancer Sister    Psoriasis Brother    Breast cancer Paternal Aunt 42   Alcoholism Other     SOCIAL HISTORY: Social History   Socioeconomic History   Marital status: Widowed    Spouse name: Not on file   Number of children: 4   Years of education: Not on file   Highest education level: Not on file  Occupational History   Occupation: Midwife    Comment: Retired, 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  Tobacco Use   Smoking status: Former    Current packs/day: 0.00    Average packs/day: 1 pack/day for 4.0 years (4.0 ttl pk-yrs)    Types: Cigarettes    Start date: 05/07/1972    Quit date: 05/06/1976    Years since quitting: 47.7   Smokeless tobacco: Never  Vaping Use   Vaping status: Never Used  Substance and Sexual Activity   Alcohol use: No    Alcohol/week: 0.0 standard drinks of alcohol   Drug use: No   Sexual activity: Not Currently    Partners: Male  Other Topics Concern   Not on file  Social History Narrative   Not on file   Social Drivers of Health   Financial Resource Strain: Low Risk  (01/08/2024)   Overall Financial Resource Strain (CARDIA)    Difficulty of Paying Living Expenses: Not hard at all  Food  Insecurity: No Food Insecurity (01/08/2024)   Hunger Vital Sign    Worried About Running Out of Food in the Last Year: Never true    Ran Out of Food in the Last Year: Never true  Transportation Needs: No Transportation Needs (01/08/2024)   PRAPARE - Administrator, Civil Service (Medical): No    Lack of Transportation (Non-Medical): No  Physical Activity: Insufficiently Active (01/08/2024)   Exercise Vital Sign    Days of Exercise per Week: 3 days    Minutes of Exercise per Session: 20 min  Stress: No Stress Concern Present (01/08/2024)   [employer] of Occupational Health - Occupational Stress Questionnaire    Feeling of Stress: Not at all  Social Connections: Moderately Isolated (01/08/2024)   Social Connection and Isolation Panel    Frequency of Communication with Friends and Family: More than three times a week    Frequency of Social Gatherings with Friends and Family: More than three times a week    Attends Religious Services: More than 4 times per year    Active Member of [employer] or Organizations: No    Attends Banker Meetings: Never    Marital Status: Widowed  Intimate Partner Violence: Not At Risk (01/08/2024)   Humiliation, Afraid, Rape, and Kick questionnaire    Fear of Current or Ex-Partner: No    Emotionally Abused: No    Physically Abused: No    Sexually Abused: No    PHYSICAL EXAM  GENERAL EXAM/CONSTITUTIONAL: Vitals:  Vitals:   01/13/24 1253  BP: 115/75  Pulse: 82  SpO2: 99%  Weight: 193 lb (87.5 kg)  Height: 5' 3 (1.6 m)   Body mass index is 34.19 kg/m. Wt Readings from Last 3 Encounters:  01/13/24 193 lb (87.5 kg)  01/08/24 193 lb 3.2 oz (87.6 kg)  12/30/23 188 lb (85.3 kg)  Patient is in no distress; well developed, nourished and groomed; neck is supple  MUSCULOSKELETAL: Gait, strength, tone, movements noted in Neurologic exam below  NEUROLOGIC: MENTAL STATUS:      No data to display             01/13/2024   12:56 PM   Montreal Cognitive Assessment   Visuospatial/ Executive (0/5) 3  Naming (0/3) 2  Attention: Read list of digits (0/2) 2  Attention: Read list of letters (0/1) 1  Attention: Serial 7 subtraction starting at 100 (0/3) 3  Language: Repeat phrase (0/2) 1  Language : Fluency (0/1) 1  Abstraction (0/2) 2  Delayed Recall (0/5) 2  Orientation (0/6) 6  Total 23    awake, alert, oriented to person, place and time recent and remote memory intact normal attention and concentration language fluent, comprehension intact, naming intact fund of knowledge appropriate  CRANIAL NERVE:  2nd, 3rd, 4th, 6th- visual fields full to confrontation, extraocular muscles intact, no nystagmus 5th - facial sensation symmetric 7th - facial strength symmetric 8th - hearing intact 9th - palate elevates symmetrically, uvula midline 11th - shoulder shrug symmetric 12th - tongue protrusion midline  MOTOR:  normal bulk and tone, full strength in the BUE, BLE  SENSORY:  normal and symmetric to light touch  COORDINATION:  finger-nose-finger, fine finger movements normal  GAIT/STATION:  normal   DIAGNOSTIC DATA (LABS, IMAGING, TESTING) - I reviewed patient records, labs, notes, testing and imaging myself where available.  Lab Results  Component Value Date   WBC 4.9 12/25/2023   HGB 12.4 12/25/2023   HCT 39.8 12/25/2023   MCV 96 12/25/2023   PLT 260 12/25/2023      Component Value Date/Time   NA 140 05/22/2023 1501   K 3.7 05/22/2023 1501   CL 100 05/22/2023 1501   CO2 26 05/22/2023 1501   GLUCOSE 78 05/22/2023 1501   GLUCOSE 102 (H) 01/30/2018 1330   BUN 23 05/22/2023 1501   CREATININE 0.80 05/22/2023 1501   CALCIUM  9.3 05/22/2023 1501   PROT 7.4 05/22/2023 1501   ALBUMIN 4.5 05/22/2023 1501   AST 41 (H) 05/22/2023 1501   ALT 36 (H) 05/22/2023 1501   ALKPHOS 85 05/22/2023 1501   BILITOT 0.4 05/22/2023 1501   GFRNONAA 75 11/17/2019 1146   GFRAA 87 11/17/2019 1146   Lab Results   Component Value Date   CHOL 134 05/22/2023   HDL 56 05/22/2023   LDLCALC 62 05/22/2023   TRIG 86 05/22/2023   CHOLHDL 2.4 05/22/2023   Lab Results  Component Value Date   HGBA1C 5.4 12/21/2023   Lab Results  Component Value Date   VITAMINB12 >2000 (H) 05/22/2023   Lab Results  Component Value Date   TSH 0.650 01/01/2023    MRI Brain 12/20/2023 Chronic microvascular ischemic change. No evidence of acute infarctio    ASSESSMENT AND PLAN  68 y.o. year old female with history of recent TIA in August, hypertension, hyperlipidemia, diabetes mellitus, family history of dementia who is presenting with complains of memory loss, no difficulty with activities of daily living, no one has complained about her memory, she scored 23 out of 30 on the MoCA possibly impacted by anxiety but overall she is doing very well.   Mild cognitive impairment Mild cognitive impairment characterized by forgetfulness without significant impact on daily functioning. Family history of Alzheimer's disease noted, but current symptoms do not meet dementia criteria. Discussed differences between mild cognitive impairment and dementia, emphasizing dementia's  significant impact on daily activities. Explained types of dementia, including Alzheimer's, vascular, Lewy body, and frontotemporal.  We agree not to obtain the Alzheimer's biomarker blood test due to potential stress and lack of symptoms. Emphasized lifestyle modifications for cognitive health. - Check thyroid  function and B12 levels. - Encourage at least 20 minutes of walking daily. - Recommend mental exercises such as puzzles, word search, and reading aloud.       1. Memory loss   2. TIA (transient ischemic attack)     Patient Instructions  Will check TSH and B12 level Continue your other medications  Continue to followup with PCP  Return if worse  There are well-accepted and sensible ways to reduce risk for Alzheimers disease and other degenerative  brain disorders .  Exercise Daily Walk A daily 20 minute walk should be part of your routine. Disease related apathy can be a significant roadblock to exercise and the only way to overcome this is to make it a daily routine and perhaps have a reward at the end (something your loved one loves to eat or drink perhaps) or a personal trainer coming to the home can also be very useful. Most importantly, the patient is much more likely to exercise if the caregiver / spouse does it with him/her. In general a structured, repetitive schedule is best.  General Health: Any diseases which effect your body will effect your brain such as a pneumonia, urinary infection, blood clot, heart attack or stroke. Keep contact with your primary care doctor for regular follow ups.  Sleep. A good nights sleep is healthy for the brain. Seven hours is recommended. If you have insomnia or poor sleep habits we can give you some instructions. If you have sleep apnea wear your mask.  Diet: Eating a heart healthy diet is also a good idea; fish and poultry instead of red meat, nuts (mostly non-peanuts), vegetables, fruits, olive oil or canola oil (instead of butter), minimal salt (use other spices to flavor foods), whole grain rice, bread, cereal and pasta and wine in moderation.Research is now showing that the MIND diet, which is a combination of The Mediterranean diet and the DASH diet, is beneficial for cognitive processing and longevity. Information about this diet can be found in The MIND Diet, a book by Annitta Feeling, MS, RDN, and online at [employer].es  Finances, Power of [address] and Advance Directives: You should consider putting legal safeguards in place with regard to financial and medical decision making. While the spouse always has power of attorney for medical and financial issues in the absence of any form, you should consider what you want in case the spouse / caregiver is no longer around or  capable of making decisions.   Orders Placed This Encounter  Procedures   Vitamin B12   TSH    No orders of the defined types were placed in this encounter.   Return if symptoms worsen or fail to improve.    Pastor Falling, MD 01/13/2024, 1:41 PM  [facility] [address] [place], [place] [address] [phone]

## 2024-01-13 NOTE — Patient Instructions (Signed)
 Will check TSH and B12 level Continue your other medications  Continue to followup with PCP  Return if worse  There are well-accepted and sensible ways to reduce risk for Alzheimers disease and other degenerative brain disorders .  Exercise Daily Walk A daily 20 minute walk should be part of your routine. Disease related apathy can be a significant roadblock to exercise and the only way to overcome this is to make it a daily routine and perhaps have a reward at the end (something your loved one loves to eat or drink perhaps) or a personal trainer coming to the home can also be very useful. Most importantly, the patient is much more likely to exercise if the caregiver / spouse does it with him/her. In general a structured, repetitive schedule is best.  General Health: Any diseases which effect your body will effect your brain such as a pneumonia, urinary infection, blood clot, heart attack or stroke. Keep contact with your primary care doctor for regular follow ups.  Sleep. A good nights sleep is healthy for the brain. Seven hours is recommended. If you have insomnia or poor sleep habits we can give you some instructions. If you have sleep apnea wear your mask.  Diet: Eating a heart healthy diet is also a good idea; fish and poultry instead of red meat, nuts (mostly non-peanuts), vegetables, fruits, olive oil or canola oil (instead of butter), minimal salt (use other spices to flavor foods), whole grain rice, bread, cereal and pasta and wine in moderation.Research is now showing that the MIND diet, which is a combination of The Mediterranean diet and the DASH diet, is beneficial for cognitive processing and longevity. Information about this diet can be found in The MIND Diet, a book by Annitta Feeling, MS, RDN, and online at [employer]  Finances, Power of [address] and Advance Directives: You should consider putting legal safeguards in place with regard to financial and  medical decision making. While the spouse always has power of attorney for medical and financial issues in the absence of any form, you should consider what you want in case the spouse / caregiver is no longer around or capable of making decisions.

## 2024-01-14 ENCOUNTER — Ambulatory Visit: Payer: Self-pay | Admitting: Neurology

## 2024-01-14 LAB — VITAMIN B12: Vitamin B-12: >2000 pg/mL — ABNORMAL HIGH (ref 232–1245)

## 2024-01-14 LAB — TSH: TSH: 0.844 u[IU]/mL (ref 0.450–4.500)

## 2024-01-27 ENCOUNTER — Ambulatory Visit (INDEPENDENT_AMBULATORY_CARE_PROVIDER_SITE_OTHER): Admitting: Internal Medicine

## 2024-01-27 ENCOUNTER — Encounter: Payer: Self-pay | Admitting: Internal Medicine

## 2024-01-27 VITALS — BP 110/78 | HR 73 | Temp 98.1°F | Ht 63.0 in | Wt 191.8 lb

## 2024-01-27 DIAGNOSIS — E785 Hyperlipidemia, unspecified: Secondary | ICD-10-CM | POA: Diagnosis not present

## 2024-01-27 DIAGNOSIS — E559 Vitamin D deficiency, unspecified: Secondary | ICD-10-CM

## 2024-01-27 DIAGNOSIS — R413 Other amnesia: Secondary | ICD-10-CM

## 2024-01-27 DIAGNOSIS — M778 Other enthesopathies, not elsewhere classified: Secondary | ICD-10-CM

## 2024-01-27 DIAGNOSIS — E2839 Other primary ovarian failure: Secondary | ICD-10-CM

## 2024-01-27 DIAGNOSIS — E1169 Type 2 diabetes mellitus with other specified complication: Secondary | ICD-10-CM | POA: Diagnosis not present

## 2024-01-27 DIAGNOSIS — I10 Essential (primary) hypertension: Secondary | ICD-10-CM | POA: Diagnosis not present

## 2024-01-27 NOTE — Patient Instructions (Signed)
 Hypertension, Adult Hypertension is another name for high blood pressure. High blood pressure forces your heart to work harder to pump blood. This can cause problems over time. There are two numbers in a blood pressure reading. There is a top number (systolic) over a bottom number (diastolic). It is best to have a blood pressure that is below 120/80. What are the causes? The cause of this condition is not known. Some other conditions can lead to high blood pressure. What increases the risk? Some lifestyle factors can make you more likely to develop high blood pressure: Smoking. Not getting enough exercise or physical activity. Being overweight. Having too much fat, sugar, calories, or salt (sodium) in your diet. Drinking too much alcohol. Other risk factors include: Having any of these conditions: Heart disease. Diabetes. High cholesterol. Kidney disease. Obstructive sleep apnea. Having a family history of high blood pressure and high cholesterol. Age. The risk increases with age. Stress. What are the signs or symptoms? High blood pressure may not cause symptoms. Very high blood pressure (hypertensive crisis) may cause: Headache. Fast or uneven heartbeats (palpitations). Shortness of breath. Nosebleed. Vomiting or feeling like you may vomit (nauseous). Changes in how you see. Very bad chest pain. Feeling dizzy. Seizures. How is this treated? This condition is treated by making healthy lifestyle changes, such as: Eating healthy foods. Exercising more. Drinking less alcohol. Your doctor may prescribe medicine if lifestyle changes do not help enough and if: Your top number is above 130. Your bottom number is above 80. Your personal target blood pressure may vary. Follow these instructions at home: Eating and drinking  If told, follow the DASH eating plan. To follow this plan: Fill one half of your plate at each meal with fruits and vegetables. Fill one fourth of your plate  at each meal with whole grains. Whole grains include whole-wheat pasta, brown rice, and whole-grain bread. Eat or drink low-fat dairy products, such as skim milk or low-fat yogurt. Fill one fourth of your plate at each meal with low-fat (lean) proteins. Low-fat proteins include fish, chicken without skin, eggs, beans, and tofu. Avoid fatty meat, cured and processed meat, or chicken with skin. Avoid pre-made or processed food. Limit the amount of salt in your diet to less than 1,500 mg each day. Do not drink alcohol if: Your doctor tells you not to drink. You are pregnant, may be pregnant, or are planning to become pregnant. If you drink alcohol: Limit how much you have to: 0-1 drink a day for women. 0-2 drinks a day for men. Know how much alcohol is in your drink. In the [place] one drink equals one 12 oz bottle of beer (355 mL), one 5 oz glass of wine (148 mL), or one 1 oz glass of hard liquor (44 mL). Lifestyle  Work with your doctor to stay at a healthy weight or to lose weight. Ask your doctor what the best weight is for you. Get at least 30 minutes of exercise that causes your heart to beat faster (aerobic exercise) most days of the week. This may include walking, swimming, or biking. Get at least 30 minutes of exercise that strengthens your muscles (resistance exercise) at least 3 days a week. This may include lifting weights or doing Pilates. Do not smoke or use any products that contain nicotine or tobacco. If you need help quitting, ask your doctor. Check your blood pressure at home as told by your doctor. Keep all follow-up visits. Medicines Take over-the-counter and prescription medicines  only as told by your doctor. Follow directions carefully. Do not skip doses of blood pressure medicine. The medicine does not work as well if you skip doses. Skipping doses also puts you at risk for problems. Ask your doctor about side effects or reactions to medicines that you should watch  for. Contact a doctor if: You think you are having a reaction to the medicine you are taking. You have headaches that keep coming back. You feel dizzy. You have swelling in your ankles. You have trouble with your vision. Get help right away if: You get a very bad headache. You start to feel mixed up (confused). You feel weak or numb. You feel faint. You have very bad pain in your: Chest. Belly (abdomen). You vomit more than once. You have trouble breathing. These symptoms may be an emergency. Get help right away. Call 911. Do not wait to see if the symptoms will go away. Do not drive yourself to the hospital. Summary Hypertension is another name for high blood pressure. High blood pressure forces your heart to work harder to pump blood. For most people, a normal blood pressure is less than 120/80. Making healthy choices can help lower blood pressure. If your blood pressure does not get lower with healthy choices, you may need to take medicine. This information is not intended to replace advice given to you by your health care provider. Make sure you discuss any questions you have with your health care provider. Document Revised: 02/09/2021 Document Reviewed: 02/09/2021 Elsevier Patient Education  2024 [employer].

## 2024-01-27 NOTE — Progress Notes (Signed)
 I,[name], CMA,acting as a Neurosurgeon for [name], MD.,have documented all relevant documentation on the behalf of [name], MD,as directed by  [name], MD while in the presence of [name], MD.  Subjective:  Patient ID: Teresa Newton , female    DOB: 01-30-1956 , 68 y.o.   MRN: [id number]  Chief Complaint  Patient presents with   Hypertension    Patient presents today for diabetes and bp f/u. She reports compliance with meds. She denies having any headaches, chest pain and shortness of breath.  She has no specific concerns.  She complains of right elbow pain , which is sore to the touch.  She also feels very fatigue.    Diabetes    HPI Discussed the use of AI scribe software for clinical note transcription with the patient, who gave verbal consent to proceed.  History of Present Illness Teresa Newton is a 68 year old female who presents for follow-up after recent hospital treatment.  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 The pain is located in her elbow. She has not experienced any recent falls and has not used any specific treatments for this pain yet.  She is currently taking aspirin, Cosentyx for psoriasis, hydrochlorothiazide , lisinopril , and Mounjaro  12.5 mg. She does not take meloxicam , which was prescribed by a foot doctor.  She feels tired and has a sore spot that has been present for a long time.  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 She has children to care for and has been attending frequent doctor's appointments. She starts work early in the morning.  No new symptoms since her hospital treatment. She reports a memory issue that was checked and found to be okay. No falls related to her elbow pain.   Diabetes She presents for her follow-up diabetic visit. She has type 2 diabetes mellitus. Her disease  course has been stable. There are no hypoglycemic associated symptoms. Pertinent negatives for diabetes include no blurred vision, no chest pain, no foot paresthesias, no polydipsia, no polyphagia and no polyuria. There are no hypoglycemic complications. Risk factors for coronary artery disease include diabetes mellitus, dyslipidemia, hypertension, obesity, post-menopausal and sedentary lifestyle. She is following a generally healthy and diabetic diet. She never participates in exercise. Her breakfast blood glucose is taken between 7-8 am. Her breakfast blood glucose range is generally 90-110 mg/dl. An ACE inhibitor/angiotensin II receptor blocker is being taken. Eye exam is not current.  Hypertension This is a chronic problem. The current episode started more than 1 year ago. The problem has been gradually improving since onset. The problem is controlled. Pertinent negatives include no blurred vision, chest pain, malaise/fatigue, palpitations or shortness of breath. Risk factors for coronary artery disease include post-menopausal state, obesity, sedentary lifestyle, dyslipidemia and diabetes mellitus. The current treatment provides moderate improvement.     Past Medical History:  Diagnosis Date   ADD (attention deficit disorder)    Back pain    Bilateral edema of lower extremity    Bimalleolar ankle fracture    right   Constipation    Diabetes (HCC)    Edema of both lower legs    Food allergy    GERD (gastroesophageal reflux disease)    Hyperlipidemia    Hypertension    Joint pain    Lactose intolerance    LTBI (  latent tuberculosis infection)    Other fatigue    Plaque psoriasis    Pre-diabetes    Shortness of breath on exertion    Sleep apnea    gastric bypass, lost 70lbs and no longer needs CPAP   Stomach ulcer      Family History  Problem Relation Age of Onset   Hypertension Mother    Diabetes Mother    Tuberculosis Mother    Heart Problems Mother    Dementia Mother     Stroke Mother    Alcoholism Father    Ovarian cancer Sister    Psoriasis Brother    Breast cancer Paternal Aunt 71   Alcoholism Other      Current Outpatient Medications:    aspirin 81 MG tablet, Take 81 mg by mouth daily., Disp: , Rfl:    benzonatate  (TESSALON ) 100 MG capsule, Take 1 capsule (100 mg total) by mouth every 8 (eight) hours., Disp: 21 capsule, Rfl: 0   CALCIUM  PO, Take 650 mg by mouth daily., Disp: , Rfl:    Cholecalciferol (D3 5000) 125 MCG (5000 UT) capsule, 5,000 IU every other day, Disp: , Rfl:    Continuous Blood Gluc Sensor (DEXCOM G7 SENSOR) MISC, USE TO CHECK BLOOD SUGARS E11.65, Disp: 3 each, Rfl: 1   COSENTYX 150 MG/ML SOSY, Inject into the skin every 30 (thirty) days., Disp: , Rfl:    Cyanocobalamin  (B-12) 500 MCG TABS, 400- 500 mcg daily, Disp: , Rfl:    ELDERBERRY PO, Take 50 mg by mouth daily., Disp: , Rfl:    EPINEPHrine  0.3 mg/0.3 mL IJ SOAJ injection, Inject 0.3 mg into the muscle as needed for anaphylaxis. INJECT AS DIRECTED FOR ANAPHYLAXIS SHOCK, Disp: 1 each, Rfl: 2   hydrochlorothiazide  (MICROZIDE ) 12.5 MG capsule, TAKE 1 CAPSULE BY MOUTH EVERY DAY, Disp: 90 capsule, Rfl: 1   levocetirizine (XYZAL ) 5 MG tablet, TAKE 1 TABLET BY MOUTH EVERY DAY IN THE EVENING, Disp: 90 tablet, Rfl: 1   lisinopril  (ZESTRIL ) 5 MG tablet, TAKE 1 TABLET (5 MG TOTAL) BY MOUTH DAILY., Disp: 90 tablet, Rfl: 1   Magnesium 250 MG TABS, Take 1 tablet by mouth daily., Disp: , Rfl:    meloxicam  (MOBIC ) 15 MG tablet, Take 1 tablet (15 mg total) by mouth daily., Disp: 30 tablet, Rfl: 0   POTASSIUM PO, Take 99 mg by mouth daily., Disp: , Rfl:    Prenatal Vit-Fe Fumarate-FA (PRENATAL PO), Take 2 tablets by mouth daily., Disp: , Rfl:    rosuvastatin  (CRESTOR ) 5 MG tablet, TAKE 1 TABLET (5 MG TOTAL) BY MOUTH DAILY., Disp: 90 tablet, Rfl: 1   tirzepatide  (MOUNJARO ) 12.5 MG/0.5ML Pen, Inject 12.5 mg into the skin once a week., Disp: 2 mL, Rfl: 1   tirzepatide  (MOUNJARO ) 15 MG/0.5ML Pen,  Inject 15 mg into the skin once a week., Disp: 6 mL, Rfl: 0   Turmeric 500 MG CAPS, Take 2 tablets by mouth daily. (Patient not taking: Reported on 01/27/2024), Disp: , Rfl:    Allergies  Allergen Reactions   Fish Allergy Anaphylaxis    Cod fish    Milk-Related Compounds Anaphylaxis    Goat Milk ONLY    Penicillins Hives     Review of Systems  Constitutional: Negative.  Negative for malaise/fatigue.  Eyes:  Negative for blurred vision.  Respiratory: Negative.  Negative for shortness of breath.   Cardiovascular: Negative.  Negative for chest pain and palpitations.  Gastrointestinal: Negative.   Endocrine: Negative for polydipsia, polyphagia and polyuria.  Neurological: Negative.   Psychiatric/Behavioral: Negative.       Today's Vitals   01/27/24 1041  BP: 110/78  Pulse: 73  Temp: 98.1 F (36.7 C)  SpO2: 98%  Weight: 191 lb 12.8 oz (87 kg)  Height: 5' 3 (1.6 m)   Body mass index is 33.98 kg/m.  Wt Readings from Last 3 Encounters:  01/28/24 184 lb (83.5 kg)  01/27/24 191 lb 12.8 oz (87 kg)  01/13/24 193 lb (87.5 kg)     Objective:  Physical Exam Vitals and nursing note reviewed.  Constitutional:      Appearance: Normal appearance.  HENT:     Head: Normocephalic and atraumatic.  Eyes:     Extraocular Movements: Extraocular movements intact.  Cardiovascular:     Rate and Rhythm: Normal rate and regular rhythm.     Heart sounds: Normal heart sounds.  Pulmonary:     Effort: Pulmonary effort is normal.     Breath sounds: Normal breath sounds.  Musculoskeletal:        General: Tenderness present.     Cervical back: Normal range of motion.  Skin:    General: Skin is warm.  Neurological:     General: No focal deficit present.     Mental Status: She is alert.  Psychiatric:        Mood and Affect: Mood normal.        Behavior: Behavior normal.         Assessment And Plan:  Essential hypertension, benign Assessment & Plan: Chronic, well controlled. She will  continue with hydrochlorothiazide  12.5mg  daily along with lisinopril  5mg  daily.  She is reminded to follow low sodium diet.   Orders: -     CMP14+EGFR -     Lipid panel  Dyslipidemia associated with type 2 diabetes mellitus (HCC) Assessment & Plan: Chronic, currently on Mounjaro  as per [facility] provider. She is encouraged to be intentional about her protein intake and to incorporate strength training into her regular exercise routine. She will f/u in four months.   Orders: -     CMP14+EGFR -     Lipid panel -     Lipoprotein A (LPA)  Memory changes Assessment & Plan: Sx appear to have improved.   Memory issues previously evaluated, has had Neuro evaluation.     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 - Recommend compression sleeve during work. - Advise Voltaren gel application twice daily. - Educated on proper gel application and sleeve use. - Will refer her to Ortho if her symptoms persist.    Vitamin D  deficiency disease -     VITAMIN D  25 Hydroxy (Vit-D Deficiency, Fractures)    Return if symptoms worsen or fail to improve.  Patient was given opportunity to ask questions. Patient verbalized understanding of the plan and was able to repeat key elements of the plan. All questions were answered to their satisfaction.   I, [name], MD, have reviewed all documentation for this visit. The documentation on 01/27/24 for the exam, diagnosis, procedures, and orders are all accurate and complete.   IF YOU HAVE BEEN REFERRED TO A SPECIALIST, IT MAY TAKE 1-2 WEEKS TO SCHEDULE/PROCESS THE REFERRAL. IF YOU HAVE NOT HEARD FROM US /SPECIALIST IN TWO WEEKS, PLEASE GIVE US  A CALL AT [phone].   THE PATIENT IS ENCOURAGED TO PRACTICE SOCIAL DISTANCING DUE TO THE COVID-19 PANDEMIC.

## 2024-01-28 ENCOUNTER — Ambulatory Visit: Admitting: Family Medicine

## 2024-01-28 ENCOUNTER — Encounter: Payer: Self-pay | Admitting: Family Medicine

## 2024-01-28 VITALS — BP 120/76 | HR 67 | Temp 98.0°F | Ht 63.0 in | Wt 184.0 lb

## 2024-01-28 DIAGNOSIS — Z6832 Body mass index (BMI) 32.0-32.9, adult: Secondary | ICD-10-CM | POA: Diagnosis not present

## 2024-01-28 DIAGNOSIS — G459 Transient cerebral ischemic attack, unspecified: Secondary | ICD-10-CM | POA: Diagnosis not present

## 2024-01-28 DIAGNOSIS — R635 Abnormal weight gain: Secondary | ICD-10-CM

## 2024-01-28 DIAGNOSIS — E6609 Other obesity due to excess calories: Secondary | ICD-10-CM

## 2024-01-28 DIAGNOSIS — E1169 Type 2 diabetes mellitus with other specified complication: Secondary | ICD-10-CM | POA: Diagnosis not present

## 2024-01-28 DIAGNOSIS — Z9884 Bariatric surgery status: Secondary | ICD-10-CM

## 2024-01-28 DIAGNOSIS — Z7985 Long-term (current) use of injectable non-insulin antidiabetic drugs: Secondary | ICD-10-CM | POA: Diagnosis not present

## 2024-01-28 DIAGNOSIS — E66811 Obesity, class 1: Secondary | ICD-10-CM | POA: Diagnosis not present

## 2024-01-28 LAB — LIPID PANEL
Chol/HDL Ratio: 2.2 ratio (ref 0.0–4.4)
Cholesterol, Total: 131 mg/dL (ref 100–199)
HDL: 59 mg/dL (ref >39)
LDL Chol Calc (NIH): 59 mg/dL (ref 0–99)
Triglycerides: 59 mg/dL (ref 0–149)
VLDL Cholesterol Cal: 13 mg/dL (ref 5–40)

## 2024-01-28 LAB — CMP14+EGFR
ALT: 28 IU/L (ref 0–32)
AST: 29 IU/L (ref 0–40)
Albumin: 4.5 g/dL (ref 3.9–4.9)
Alkaline Phosphatase: 79 IU/L (ref 49–135)
BUN/Creatinine Ratio: 29 — ABNORMAL HIGH (ref 12–28)
BUN: 24 mg/dL (ref 8–27)
Bilirubin Total: 0.7 mg/dL (ref 0.0–1.2)
CO2: 25 mmol/L (ref 20–29)
Calcium: 10 mg/dL (ref 8.7–10.3)
Chloride: 99 mmol/L (ref 96–106)
Creatinine, Ser: 0.84 mg/dL (ref 0.57–1.00)
Globulin, Total: 2.7 g/dL (ref 1.5–4.5)
Glucose: 80 mg/dL (ref 70–99)
Potassium: 3.9 mmol/L (ref 3.5–5.2)
Sodium: 140 mmol/L (ref 134–144)
Total Protein: 7.2 g/dL (ref 6.0–8.5)
eGFR: 76 mL/min/1.73 (ref >59)

## 2024-01-28 MED ORDER — TIRZEPATIDE 15 MG/0.5ML ~~LOC~~ SOAJ: 15.0000 mg | SUBCUTANEOUS | 0 refills | Status: DC

## 2024-01-28 NOTE — Progress Notes (Signed)
 Office: [phone]  /  Fax: [phone]  WEIGHT SUMMARY AND BIOMETRICS  Starting Date: 01/01/23  Starting Weight: 214lb   Weight Lost Since Last Visit: 4lb   Vitals Temp: 98 F (36.7 C) BP: 120/76 Pulse Rate: 67 SpO2: 100 %   Body Composition  Body Fat %: 42.5 % Fat Mass (lbs): 78.2 lbs Muscle Mass (lbs): 100.4 lbs Total Body Water (lbs): 70 lbs Visceral Fat Rating : 12    HPI  Chief Complaint: OBESITY  Teresa Newton is here to discuss her progress with her obesity treatment plan. She is on the the [facility] and states she is following her eating plan approximately 100 % of the time. She states she is exercising 30-40 minutes 2 times per week.  Interval History:  Since last office visit she is down 4 lb She is down 0.6 lb of muscle mass and down 4 lb of body fat  This is a 30 lb of total body weight loss in 1 year of medically supervised weight management and post op regain This is a 14% TBW loss She did step up her walking time using a smart watch to track steps She has a plan for walking indoors- with a walking pad She is mindful of food choices She is doing better with veggies and some fruit She limits sugar intake She is not skipping meals or having GI upset from Mounjaro  12.5 mg weekly She has been happy with her weight loss with weight regain post gastric bypass surgery 2014  Pharmacotherapy: Mounjaro  12.5 mg weekly  PHYSICAL EXAM:  Blood pressure 120/76, pulse 67, temperature 98 F (36.7 C), height 5' 3 (1.6 m), weight 184 lb (83.5 kg), SpO2 100%. Body mass index is 32.59 kg/m.  General: She is healthy appearing, cooperative, alert, well developed, and in no acute distress. PSYCH: Has normal mood, affect and thought process.   Lungs: Normal breathing effort, no conversational dyspnea.   ASSESSMENT AND PLAN  TREATMENT PLAN FOR OBESITY:  Recommended Dietary Goals  Teresa Newton is currently in the action stage of change. As such, her goal is to  continue weight management plan. She has agreed to keeping a food journal and adhering to recommended goals of 1300 calories and 90 g of protein and following a lower carbohydrate, vegetable and lean protein rich diet plan.  Behavioral Intervention  We discussed the following Behavioral Modification Strategies today: increasing lean protein intake to established goals, increasing water intake , work on meal planning and preparation, keeping healthy foods at home, practice mindfulness eating and understand the difference between hunger signals and cravings, continue to practice mindfulness when eating, and planning for success.  Additional resources provided today: NA  Recommended Physical Activity Goals  Teresa Newton has been advised to work up to 150 minutes of moderate intensity aerobic activity a week and strengthening exercises 2-3 times per week for cardiovascular health, weight loss maintenance and preservation of muscle mass.   She has agreed to [employer] strengthening exercises with a goal of 2-3 sessions a week  and Increase the intensity, frequency or duration of aerobic exercises    Pharmacotherapy changes for the treatment of obesity: Increase Mounjaro  to 15 mg once weekly injection  ASSOCIATED CONDITIONS ADDRESSED TODAY  Type 2 diabetes mellitus with other specified complication, unspecified whether long term insulin  use (HCC) Lab Results  Component Value Date   HGBA1C 5.4 12/21/2023  Good control of her type 2 diabetes using Mounjaro  12.5 mg once weekly injection in combination with a healthier diet,  reducing carb and sugar intake.  She has recently increased her walking time.  She denies GI upset or meal skipping or hypoglycemia from Mounjaro .  She is seeing her PCP for the rest for diabetes management care.  Increase Mounjaro  to 15 mg once weekly injection -     Tirzepatide ; Inject 15 mg into the skin once a week.  Dispense: 6 mL; Refill: 0  Class 1 obesity due to excess calories  with serious comorbidity and body mass index (BMI) of 32.0 to 32.9 in adult Improving Reviewed results from bioimpedance  Weight gain status post gastric bypass Improving.  She has been happy with her weight loss status post gastric bypass surgery 2014.  She is now lower than her previous nadir weight of 185 pounds.  Continue to work on lean protein and fiber with meals, portion control at mealtime, limiting excess snacks and sugar intake.  Added resistance training from home 10 minutes daily.  TIA (transient ischemic attack) Reviewed notes from Dr. Gregg dated 01/13/2024 following her TIA with hospital admission in August.  She has tried to keep stress levels low and is compliant with taking all of her listed medications for hypertension and hyperlipidemia.  She reports normal memory testing done by neurology.  Her family has been supportive.  Continue to work on cardiometabolic health.     She was informed of the importance of frequent follow up visits to maximize her success with intensive lifestyle modifications for her multiple health conditions.   ATTESTASTION STATEMENTS:  Reviewed by clinician on day of visit: allergies, medications, problem list, medical history, surgical history, family history, social history, and previous encounter notes pertinent to obesity diagnosis.   I have personally spent 30 minutes total time today in preparation, patient care, nutritional counseling and education,  and documentation for this visit, including the following: review of most recent clinical lab tests, prescribing medications/ refilling medications, reviewing medical assistant documentation, review and interpretation of bioimpedence results.     Teresa Newton, D.O. DABFM, DABOM [facility] [address] [place], [place] [address] [phone]

## 2024-01-28 NOTE — Patient Instructions (Signed)
 Aim for 8,000 steps/ day or more Add in more resistance training from home  Keep up the good work Continue to work on meal planning/ healthy food choices/ eating on a schedule/ hydrating well with water  Go up on Mounjaro  to 15 mg weekly  Call if any problems or questions

## 2024-01-29 LAB — LIPOPROTEIN A (LPA): Lipoprotein (a): 199.7 nmol/L — ABNORMAL HIGH (ref <75.0)

## 2024-01-29 LAB — VITAMIN D 25 HYDROXY (VIT D DEFICIENCY, FRACTURES): Vit D, 25-Hydroxy: 72.5 ng/mL (ref 30.0–100.0)

## 2024-02-01 ENCOUNTER — Encounter: Payer: Self-pay | Admitting: Internal Medicine

## 2024-02-01 ENCOUNTER — Ambulatory Visit: Payer: Self-pay | Admitting: Internal Medicine

## 2024-02-01 DIAGNOSIS — M778 Other enthesopathies, not elsewhere classified: Secondary | ICD-10-CM | POA: Insufficient documentation

## 2024-02-01 NOTE — Assessment & Plan Note (Signed)
 Chronic, well controlled. She will continue with hydrochlorothiazide  12.5mg  daily along with lisinopril  5mg  daily.  She is reminded to follow low sodium diet.

## 2024-02-01 NOTE — Assessment & Plan Note (Signed)
 Chronic, currently on Mounjaro as per [facility] provider. She is encouraged to be intentional about her protein intake and to incorporate strength training into her regular exercise routine. She will f/u in four months.

## 2024-02-01 NOTE — Assessment & Plan Note (Signed)
 Sx appear to have improved.   Memory issues previously evaluated, has had Neuro evaluation.

## 2024-02-01 NOTE — Assessment & Plan Note (Signed)
 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 - Recommend compression sleeve during work. - Advise Voltaren gel application twice daily. - Educated on proper gel application and sleeve use. - Will refer her to Ortho if her symptoms persist.

## 2024-02-11 DIAGNOSIS — L400 Psoriasis vulgaris: Secondary | ICD-10-CM | POA: Diagnosis not present

## 2024-02-11 DIAGNOSIS — Z79899 Other long term (current) drug therapy: Secondary | ICD-10-CM | POA: Diagnosis not present

## 2024-02-16 ENCOUNTER — Other Ambulatory Visit: Payer: Self-pay | Admitting: Internal Medicine

## 2024-02-16 DIAGNOSIS — E1169 Type 2 diabetes mellitus with other specified complication: Secondary | ICD-10-CM

## 2024-03-05 ENCOUNTER — Ambulatory Visit (HOSPITAL_BASED_OUTPATIENT_CLINIC_OR_DEPARTMENT_OTHER)
Admission: RE | Admit: 2024-03-05 | Discharge: 2024-03-05 | Disposition: A | Discharge status: Home / Self Care | Payer: Self-pay | Source: Ambulatory Visit | Attending: Internal Medicine | Admitting: Internal Medicine

## 2024-03-05 DIAGNOSIS — E785 Hyperlipidemia, unspecified: Secondary | ICD-10-CM | POA: Insufficient documentation

## 2024-03-05 DIAGNOSIS — E1169 Type 2 diabetes mellitus with other specified complication: Secondary | ICD-10-CM | POA: Insufficient documentation

## 2024-03-09 ENCOUNTER — Ambulatory Visit: Payer: Self-pay | Admitting: Internal Medicine

## 2024-03-17 ENCOUNTER — Ambulatory Visit: Admitting: Family Medicine

## 2024-03-25 ENCOUNTER — Encounter: Payer: Self-pay | Admitting: Family Medicine

## 2024-03-25 ENCOUNTER — Ambulatory Visit: Admitting: Family Medicine

## 2024-03-25 VITALS — BP 111/72 | HR 69 | Temp 97.7°F | Ht 63.0 in | Wt 178.0 lb

## 2024-03-25 DIAGNOSIS — Z6831 Body mass index (BMI) 31.0-31.9, adult: Secondary | ICD-10-CM | POA: Diagnosis not present

## 2024-03-25 DIAGNOSIS — E66811 Obesity, class 1: Secondary | ICD-10-CM | POA: Diagnosis not present

## 2024-03-25 DIAGNOSIS — E1169 Type 2 diabetes mellitus with other specified complication: Secondary | ICD-10-CM | POA: Diagnosis not present

## 2024-03-25 DIAGNOSIS — Z9884 Bariatric surgery status: Secondary | ICD-10-CM | POA: Diagnosis not present

## 2024-03-25 DIAGNOSIS — Z7985 Long-term (current) use of injectable non-insulin antidiabetic drugs: Secondary | ICD-10-CM

## 2024-03-25 DIAGNOSIS — R635 Abnormal weight gain: Secondary | ICD-10-CM

## 2024-03-25 MED ORDER — TIRZEPATIDE 15 MG/0.5ML ~~LOC~~ SOAJ: 15.0000 mg | SUBCUTANEOUS | 0 refills | Status: DC

## 2024-03-25 NOTE — Progress Notes (Signed)
 Office: [phone]  /  Fax: [phone]  WEIGHT SUMMARY AND BIOMETRICS  Starting Date: 01/01/23  Starting Weight: 214lb   Weight Lost Since Last Visit: 6lb   Vitals Temp: 97.7 F (36.5 C) BP: 111/72 Pulse Rate: 69 SpO2: 98 %   Body Composition  Body Fat %: 42.6 % Fat Mass (lbs): 76 lbs Muscle Mass (lbs): 97.4 lbs Total Body Water (lbs): 69.2 lbs Visceral Fat Rating : 12    HPI  Chief Complaint: OBESITY  Teresa Newton is here to discuss her progress with her obesity treatment plan. She is on the the [facility] and states she is following her eating plan approximately 100 % of the time. She states she is exercising 30 minutes 2-3 times per week.  Interval History:  Since last office visit she is down 6 lb She is down 3 pounds of muscle mass and down 2.2 pounds of body fat since last visit This gives her a net weight loss of 36 pounds in 1 year of medically supervised weight management and treatment of postop regain status post Roux-en-Y gastric bypass surgery in 2014 This is a 16.8% total body weight loss She did tolerate dose increase of Mounjaro  to 15 mg weekly She has been walking 2-3 x a week (20-30 min) She hasn't wanted to walk since her husband passed She has a walking pad and [facility] She plans to add in weight training classes or water exercise at the gym  Pharmacotherapy: Mounjaro  15 mg once weekly injection  PHYSICAL EXAM:  Blood pressure 111/72, pulse 69, temperature 97.7 F (36.5 C), height 5' 3 (1.6 m), weight 178 lb (80.7 kg), SpO2 98%. Body mass index is 31.53 kg/m.  General: She is healthy appearing, cooperative, alert, well developed, and in no acute distress. PSYCH: Has normal mood, affect and thought process.   Lungs: Normal breathing effort, no conversational dyspnea.  ASSESSMENT AND PLAN  TREATMENT PLAN FOR OBESITY:  Recommended Dietary Goals  Teresa Newton is currently in the action stage of change. As such, her goal is to  continue weight management plan. She has agreed to keeping a food journal and adhering to recommended goals of 1200 calories and 80 g of protein and following a lower carbohydrate, vegetable and lean protein rich diet plan.  Behavioral Intervention  We discussed the following Behavioral Modification Strategies today: increasing lean protein intake to established goals, increasing fiber rich foods, avoiding skipping meals, increasing water intake , work on meal planning and preparation, keeping healthy foods at home, practice mindfulness eating and understand the difference between hunger signals and cravings, work on managing stress, creating time for self-care and relaxation, avoiding temptations and identifying enticing environmental cues, and continue to practice mindfulness when eating.  Additional resources provided today: NA  Recommended Physical Activity Goals  Teresa Newton has been advised to work up to 150 minutes of moderate intensity aerobic activity a week and strengthening exercises 2-3 times per week for cardiovascular health, weight loss maintenance and preservation of muscle mass.   She has agreed to [employer] strengthening exercises with a goal of 2-3 sessions a week  and Start aerobic activity with a goal of 150 minutes a week at moderate intensity.  We discussed the importance of adding in resistance training 2 days a week and aiming for at least 30 minutes of walking or other form of cardio daily She agrees to starting back at the [facility]  Pharmacotherapy changes for the treatment of obesity: None  ASSOCIATED CONDITIONS ADDRESSED  TODAY  Type 2 diabetes mellitus with other specified complication, unspecified whether long term insulin  use (HCC) She has good control of type 2 diabetes based on her last A1c.  She denies hypoglycemia or meal skipping.  She is doing well on Mounjaro  at 15 mg once weekly injection without GI upset.  Continue current dose of Mounjaro .  Follow-up with PCP for  routine diabetes management. -     Tirzepatide ; Inject 15 mg into the skin once a week.  Dispense: 6 mL; Refill: 0  Obesity, Class I, BMI 30-34.9 Improving and within 10 to 15 pounds of goal weight She has now lower than her previous nadir weight post gastric bypass surgery 185 pounds Reviewed bioimpedance results  BMI 31.0-31.9,adult  Weight gain status post gastric bypass Improved.  She is eating on a schedule and has food volume control with use of Mounjaro , lean protein and fiber intake with meals, avoiding over snacking or large portion sizes.  She is now lower than her previous nadir weight of 185 pounds with a target weight around 165 pounds.  Continue bariatric multivitamin, calcium  and vitamin D  as directed.  Annual bariatric labs are reviewed and up-to-date.     She was informed of the importance of frequent follow up visits to maximize her success with intensive lifestyle modifications for her multiple health conditions.   ATTESTASTION STATEMENTS:  Reviewed by clinician on day of visit: allergies, medications, problem list, medical history, surgical history, family history, social history, and previous encounter notes pertinent to obesity diagnosis.   I have personally spent 30 minutes total time today in preparation, patient care, nutritional counseling and education,  and documentation for this visit, including the following: review of most recent clinical lab tests, prescribing medications/ refilling medications, reviewing medical assistant documentation, review and interpretation of bioimpedence results.     Darice Haddock, D.O. DABFM, DABOM [facility] [address] [place], [place] [address] [phone]

## 2024-04-01 DIAGNOSIS — Z780 Asymptomatic menopausal state: Secondary | ICD-10-CM | POA: Diagnosis not present

## 2024-04-01 LAB — HM DEXA SCAN: HM Dexa Scan: NORMAL

## 2024-04-07 ENCOUNTER — Encounter: Payer: Self-pay | Admitting: Internal Medicine

## 2024-04-07 ENCOUNTER — Ambulatory Visit: Payer: Self-pay | Admitting: Internal Medicine

## 2024-04-16 LAB — OPHTHALMOLOGY REPORT-SCANNED

## 2024-05-19 ENCOUNTER — Ambulatory Visit: Admitting: Family Medicine

## 2024-05-21 ENCOUNTER — Encounter: Payer: Self-pay | Admitting: Family Medicine

## 2024-05-21 ENCOUNTER — Other Ambulatory Visit: Payer: Self-pay | Admitting: Internal Medicine

## 2024-05-21 ENCOUNTER — Ambulatory Visit: Admitting: Family Medicine

## 2024-05-21 VITALS — BP 97/65 | HR 77 | Ht 63.0 in | Wt 175.0 lb

## 2024-05-21 DIAGNOSIS — E1169 Type 2 diabetes mellitus with other specified complication: Secondary | ICD-10-CM | POA: Diagnosis not present

## 2024-05-21 DIAGNOSIS — E66811 Obesity, class 1: Secondary | ICD-10-CM

## 2024-05-21 DIAGNOSIS — Z7985 Long-term (current) use of injectable non-insulin antidiabetic drugs: Secondary | ICD-10-CM

## 2024-05-21 DIAGNOSIS — Z6831 Body mass index (BMI) 31.0-31.9, adult: Secondary | ICD-10-CM

## 2024-05-21 DIAGNOSIS — Z1231 Encounter for screening mammogram for malignant neoplasm of breast: Secondary | ICD-10-CM

## 2024-05-21 DIAGNOSIS — Z9884 Bariatric surgery status: Secondary | ICD-10-CM

## 2024-05-21 MED ORDER — TIRZEPATIDE 15 MG/0.5ML ~~LOC~~ SOAJ: 15.0000 mg | SUBCUTANEOUS | 0 refills | Status: AC

## 2024-05-21 NOTE — Progress Notes (Signed)
 "  Office: [phone]  /  Fax: [phone]  WEIGHT SUMMARY AND BIOMETRICS  Starting Date: 01/01/23  Starting Weight: 214lb   Weight Lost Since Last Visit: 3lb   Vitals BP: 97/65 Pulse Rate: 77 SpO2: 99 %   Body Composition  Body Fat %: 42.3 % Fat Mass (lbs): 74 lbs Muscle Mass (lbs): 95.8 lbs Total Body Water (lbs): 69.6 lbs Visceral Fat Rating : 12     HPI  Chief Complaint: OBESITY  Carry is here to discuss her progress with her obesity treatment plan. She is on the the Vegetarian Plan and states she is following her eating plan approximately 100 % of the time. She states she is exercising 30 minutes 3 times per week.  Interval History:  Since last office visit she is down 3 lb She is down 1.6 lb of muscle mass and down 2 lb of body fat since last visit This gives her a net weight loss of 39 lb in the past 1+ years of medically supervised weight management This is an 18% TBW loss She is still driving the school bus She eats in the early morning, has a mid morning snack (cheese and fruit), lunch and dinner; adding in a protein smoothie as one of her meals She had a good amount of control over the holidays She is working on improving water intake She is sleeping better She denies meal skipping, low blood sugar or nausea on Mounjaro  Her workouts include walking and ramping up strength training at home She is 10 lb less than her previous nadir weight of 185 lb post RYGB surgery in 2014  Pharmacotherapy: Mounjaro  15 mg weekly  PHYSICAL EXAM:  Blood pressure 97/65, pulse 77, height 5' 3 (1.6 m), weight 175 lb (79.4 kg), SpO2 99%. Body mass index is 31 kg/m.  General: She is healthy appearing,  cooperative, alert, well developed, and in no acute distress. PSYCH: Has normal mood, affect and thought process.   Lungs: Normal breathing effort, no conversational dyspnea.  ASSESSMENT AND PLAN  TREATMENT PLAN FOR OBESITY:  Recommended Dietary Goals  Teresa Newton is  currently in the action stage of change. As such, her goal is to continue weight management plan. She has agreed to following a lower carbohydrate, vegetable and lean protein rich diet plan.  Behavioral Intervention  We discussed the following Behavioral Modification Strategies today: increasing lean protein intake to established goals, increasing fiber rich foods, increasing water intake , work on meal planning and preparation, keeping healthy foods at home, work on managing stress, creating time for self-care and relaxation, avoiding temptations and identifying enticing environmental cues, continue to practice mindfulness when eating, and planning for success.  Additional resources provided today: NA  Recommended Physical Activity Goals  Anani has been advised to work up to 150 minutes of moderate intensity aerobic activity a week and strengthening exercises 2-3 times per week for cardiovascular health, weight loss maintenance and preservation of muscle mass.   She has agreed to [employer] strengthening exercises with a goal of 2-3 sessions a week  and Increase the intensity, frequency or duration of aerobic exercises    Pharmacotherapy changes for the treatment of obesity: none  ASSOCIATED CONDITIONS ADDRESSED TODAY  Type 2 diabetes mellitus with other specified complication, unspecified whether long term insulin  use (HCC) Lab Results  Component Value Date   HGBA1C 5.4 12/21/2023  Excellent glucose control on Mounjaro  15 mg weekly without hypoglycemia or GI upset.  She has done great with an 18% TBW loss  in the past year with a healthy diet and regular exercise.  Continue Mounjaro  at current dose and plan to reduce dose over the next 3-4 mos. -     Tirzepatide ; Inject 15 mg into the skin once a week.  Dispense: 6 mL; Refill: 0  Obesity, Class I, BMI 30-34.9 Improving She is motivated to lose about 10 more pounds Ramp up weight training to 3 days/ wk and increase walking time in the Spring  to >150 min/ wk  BMI 31.0-31.9,adult  Weight gain status post gastric bypass Improving Aim for 80 g of dietary protein intake daily Hydrate well with water outside of mealtime     She was informed of the importance of frequent follow up visits to maximize her success with intensive lifestyle modifications for her multiple health conditions.   ATTESTASTION STATEMENTS:  Reviewed by clinician on day of visit: allergies, medications, problem list, medical history, surgical history, family history, social history, and previous encounter notes pertinent to obesity diagnosis.   I personally spent a total of 24 minutes in the care of the patient today including preparing to see the patient, getting/reviewing separately obtained history, performing a medically appropriate exam/evaluation, counseling and educating, placing orders, and documenting clinical information in the EHR.    Darice Haddock, D.O. DABFM, DABOM [facility] [address] [place], [place] [address] [phone] "

## 2024-05-25 ENCOUNTER — Encounter: Payer: Medicare PPO | Admitting: Internal Medicine

## 2024-05-25 NOTE — Patient Instructions (Signed)

## 2024-05-25 NOTE — Progress Notes (Signed)
 I,[name], CMA,acting as a neurosurgeon for [name], MD.,have documented all relevant documentation on the behalf of [name], MD,as directed by  [name], MD while in the presence of [name], MD.  Subjective:    Patient ID: Teresa Newton , female    DOB: August 25, 1955 , 69 y.o.   MRN: [id number]  Chief Complaint  Patient presents with   Annual Exam    Patient presents today for annual exam.  She goes to [facility] for Gyn exams. She reports compliance with meds. She denies having any headaches, chest pain and shortness of breath.  She reports she would like to discuss if she can come off of her bp meds.    HPI Discussed the use of AI scribe software for clinical note transcription with the patient, who gave verbal consent to proceed.  History of Present Illness Teresa Newton is a 69 year old female with diabetes who presents for a physical exam and follow-up on her diabetes management.  She has been experiencing low blood pressure with recent readings of 97/65 mmHg and 100/60 mmHg. No dizziness is reported, and she feels well overall. Her current medications for blood pressure management include lisinopril  and hydrochlorothiazide .  Her weight was recorded at 177-176 pounds last week at her weight loss center, with a goal weight of 160 pounds. She is currently taking Mounjaro  15 mg for weight management and has been losing weight, now 15 pounds away from her target.  She denies any swelling except for her left ankle, which she attributes to a previous fracture.  She has psoriasis, which has improved with weight loss and dietary changes, including cutting out sweets and bread and adding lamb to her diet. Psoriasis has affected her toenails, causing abnormal growth.  No chest pain or shortness of breath is reported. Bowel movements are regular, occurring two to three times a day, and are normal in consistency.  She is taking aspirin and rosuvastatin  for  cholesterol management. Her last cholesterol check was in September. She had a banana and protein shake for breakfast today.  She lives alone since her daughter moved to [place] . She works and has visited [place] , where her daughter and grandson live.   Diabetes She presents for her follow-up diabetic visit. She has type 2 diabetes mellitus. Her disease course has been stable. There are no hypoglycemic associated symptoms. Pertinent negatives for diabetes include no blurred vision, no chest pain, no foot paresthesias, no polydipsia and no polyphagia. There are no hypoglycemic complications. Risk factors for coronary artery disease include diabetes mellitus, dyslipidemia, hypertension, obesity, post-menopausal and sedentary lifestyle. She is following a generally healthy and diabetic diet. She never participates in exercise. Her breakfast blood glucose is taken between 7-8 am. Her breakfast blood glucose range is generally 90-110 mg/dl. An ACE inhibitor/angiotensin II receptor blocker is being taken. Eye exam is not current.  Hypertension This is a chronic problem. The current episode started more than 1 year ago. The problem has been gradually improving since onset. The problem is controlled. Pertinent negatives include no blurred vision, chest pain, malaise/fatigue, palpitations or shortness of breath. Risk factors for coronary artery disease include post-menopausal state, obesity, sedentary lifestyle, dyslipidemia and diabetes mellitus. The current treatment provides moderate improvement.     Past Medical History:  Diagnosis Date   ADD (attention deficit disorder)    Back pain    Bilateral edema of lower extremity    Bimalleolar ankle fracture  right   Constipation    Diabetes (HCC)    Edema of both lower legs    Food allergy    GERD (gastroesophageal reflux disease)    Hyperlipidemia    Hypertension    Joint pain    Lactose intolerance    LTBI (latent tuberculosis infection)     Other fatigue    Plaque psoriasis    Pre-diabetes    Shortness of breath on exertion    Sleep apnea    gastric bypass, lost 70lbs and no longer needs CPAP   Stomach ulcer      Family History  Problem Relation Age of Onset   Hypertension Mother    Diabetes Mother    Tuberculosis Mother    Heart Problems Mother    Dementia Mother    Stroke Mother    Alcoholism Father    Ovarian cancer Sister    Psoriasis Brother    Breast cancer Paternal Aunt 22   Alcoholism Other      Current Outpatient Medications:    aspirin 81 MG tablet, Take 81 mg by mouth daily., Disp: , Rfl:    CALCIUM  PO, Take 650 mg by mouth daily., Disp: , Rfl:    Cholecalciferol (D3 5000) 125 MCG (5000 UT) capsule, 5,000 IU every other day, Disp: , Rfl:    Continuous Blood Gluc Sensor (DEXCOM G7 SENSOR) MISC, USE TO CHECK BLOOD SUGARS E11.65, Disp: 3 each, Rfl: 1   COSENTYX 150 MG/ML SOSY, Inject into the skin every 30 (thirty) days., Disp: , Rfl:    Cyanocobalamin  (B-12) 500 MCG TABS, 400- 500 mcg daily, Disp: , Rfl:    ELDERBERRY PO, Take 50 mg by mouth daily., Disp: , Rfl:    EPINEPHrine  0.3 mg/0.3 mL IJ SOAJ injection, Inject 0.3 mg into the muscle as needed for anaphylaxis. INJECT AS DIRECTED FOR ANAPHYLAXIS SHOCK, Disp: 1 each, Rfl: 2   hydrochlorothiazide  (MICROZIDE ) 12.5 MG capsule, TAKE 1 CAPSULE BY MOUTH EVERY DAY, Disp: 90 capsule, Rfl: 1   levocetirizine (XYZAL ) 5 MG tablet, TAKE 1 TABLET BY MOUTH EVERY DAY IN THE EVENING, Disp: 90 tablet, Rfl: 1   Magnesium 250 MG TABS, Take 1 tablet by mouth daily., Disp: , Rfl:    meloxicam  (MOBIC ) 15 MG tablet, Take 1 tablet (15 mg total) by mouth daily., Disp: 30 tablet, Rfl: 0   POTASSIUM PO, Take 99 mg by mouth daily., Disp: , Rfl:    Prenatal Vit-Fe Fumarate-FA (PRENATAL PO), Take 2 tablets by mouth daily., Disp: , Rfl:    rosuvastatin  (CRESTOR ) 5 MG tablet, TAKE 1 TABLET (5 MG TOTAL) BY MOUTH DAILY., Disp: 90 tablet, Rfl: 1   tirzepatide  (MOUNJARO ) 15  MG/0.5ML Pen, Inject 15 mg into the skin once a week., Disp: 6 mL, Rfl: 0   Allergies  Allergen Reactions   Fish Allergy Anaphylaxis    Cod fish    Milk-Related Compounds Anaphylaxis    Goat Milk ONLY    Penicillins Hives      The patient states she uses none for birth control. No LMP recorded. Patient is postmenopausal.. Negative for Dysmenorrhea. Negative for: breast discharge, breast lump(s), breast pain and breast self exam. Associated symptoms include abnormal vaginal bleeding. Pertinent negatives include abnormal bleeding (hematology), anxiety, decreased libido, depression, difficulty falling sleep, dyspareunia, history of infertility, nocturia, sexual dysfunction, sleep disturbances, urinary incontinence, urinary urgency, vaginal discharge and vaginal itching. Diet regular.The patient states her exercise level is  intermittent.  . The patient's tobacco use is: Tobacco  Use History[1]. She has been exposed to passive smoke. The patient's alcohol use is:  Social History   Substance and Sexual Activity  Alcohol Use No   Alcohol/week: 0.0 standard drinks of alcohol   Review of Systems  Constitutional: Negative.  Negative for malaise/fatigue.  HENT: Negative.    Eyes: Negative.  Negative for blurred vision.  Respiratory: Negative.  Negative for shortness of breath.   Cardiovascular: Negative.  Negative for chest pain and palpitations.  Gastrointestinal: Negative.   Endocrine: Negative.  Negative for polydipsia and polyphagia.  Genitourinary: Negative.   Musculoskeletal: Negative.   Skin: Negative.   Allergic/Immunologic: Negative.   Neurological: Negative.   Hematological: Negative.   Psychiatric/Behavioral: Negative.       Today's Vitals   05/26/24 0925  BP: 100/60  Pulse: 67  Temp: 97.9 F (36.6 C)  TempSrc: Oral  Weight: 185 lb (83.9 kg)  Height: 5' 3 (1.6 m)  PainSc: 0-No pain   Body mass index is 32.77 kg/m.  Wt Readings from Last 3 Encounters:  05/26/24 185  lb (83.9 kg)  05/21/24 175 lb (79.4 kg)  03/25/24 178 lb (80.7 kg)    BP Readings from Last 3 Encounters:  05/26/24 100/60  05/21/24 97/65  03/25/24 111/72     Objective:  Physical Exam Vitals and nursing note reviewed.  Constitutional:      Appearance: Normal appearance. She is obese.  HENT:     Head: Normocephalic and atraumatic.     Right Ear: Tympanic membrane, ear canal and external ear normal.     Left Ear: Tympanic membrane, ear canal and external ear normal.     Nose: Nose normal.     Mouth/Throat:     Mouth: Mucous membranes are moist.     Pharynx: Oropharynx is clear.  Eyes:     Extraocular Movements: Extraocular movements intact.     Conjunctiva/sclera: Conjunctivae normal.     Pupils: Pupils are equal, round, and reactive to light.  Cardiovascular:     Rate and Rhythm: Normal rate and regular rhythm.     Pulses: Normal pulses.          Dorsalis pedis pulses are 2+ on the right side and 2+ on the left side.     Heart sounds: Normal heart sounds.  Pulmonary:     Effort: Pulmonary effort is normal.     Breath sounds: Normal breath sounds.  Chest:  Breasts:    Tanner Score is 5.     Right: Normal.     Left: Normal.  Abdominal:     General: Bowel sounds are normal.     Palpations: Abdomen is soft.     Comments: Obese, soft. Difficult to assess organomegaly.   Genitourinary:    Comments: deferred Musculoskeletal:        General: Normal range of motion.     Cervical back: Normal range of motion and neck supple.  Feet:     Right foot:     Protective Sensation: 5 sites tested.  5 sites sensed.     Skin integrity: Skin integrity normal.     Toenail Condition: Right toenails are normal.     Left foot:     Protective Sensation: 5 sites tested.  5 sites sensed.     Skin integrity: Skin integrity normal.     Toenail Condition: Left toenails are normal.  Skin:    General: Skin is warm and dry.  Neurological:     General: No focal deficit present.  Mental  Status: She is alert and oriented to person, place, and time.  Psychiatric:        Mood and Affect: Mood normal.        Behavior: Behavior normal.         Assessment And Plan:     Annual physical exam Assessment & Plan: A full exam was performed.  Importance of monthly self breast exams was discussed with the patient.  She is advised to get 30-45 minutes of regular exercise, no less than four to five days per week. Both weight-bearing and aerobic exercises are recommended.  She is advised to follow a healthy diet with at least six fruits/veggies per day, decrease intake of red meat and other saturated fats and to increase fish intake to twice weekly.  Meats/fish should not be fried -- baked, boiled or broiled is preferable. It is also important to cut back on your sugar intake.  Be sure to read labels - try to avoid anything with added sugar, high fructose corn syrup or other sweeteners.  If you must use a sweetener, you can try stevia or monkfruit.  It is also important to avoid artificially sweetened foods/beverages and diet drinks. Lastly, wear SPF 50 sunscreen on exposed skin and when in direct sunlight for an extended period of time.  Be sure to avoid fast food restaurants and aim for at least 60 ounces of water daily.      Orders: -     CBC -     CMP14+EGFR -     Hemoglobin A1c  Dyslipidemia associated with type 2 diabetes mellitus (HCC) Assessment & Plan: Chronic, LDL goal is less than 70. Diabetic foot exam was performed.   Diabetes well-controlled, stable A1c. Dyslipidemia managed with rosuvastatin . Cardiac calcium  score 52, necessitating continued cholesterol management. - Continue rosuvastatin . - Continue with Mounjaro  15mg  weekly. - A1c testing every six months. - Encouraged dietary changes: reduce sweets and bread, incorporate lamb and cruciferous vegetables. - I DISCUSSED WITH THE PATIENT AT LENGTH REGARDING THE GOALS OF GLYCEMIC CONTROL AND POSSIBLE LONG-TERM COMPLICATIONS.   I  ALSO STRESSED THE IMPORTANCE OF COMPLIANCE WITH HOME GLUCOSE MONITORING, DIETARY RESTRICTIONS INCLUDING AVOIDANCE OF SUGARY DRINKS/PROCESSED FOODS,  ALONG WITH REGULAR EXERCISE.  I  ALSO STRESSED THE IMPORTANCE OF ANNUAL EYE EXAMS, SELF FOOT CARE AND COMPLIANCE WITH OFFICE VISITS.   Orders: -     POCT URINALYSIS DIP (CLINITEK) -     Microalbumin / creatinine urine ratio -     EKG 12-Lead  Essential hypertension, benign Assessment & Plan: Chronic, low end of normal. She denies having any dizziness at this time. Blood pressure low, no dizziness.  EKG performed, NSR w/ T abnormality - denies cp, sob w/ exertion.   Lisinopril  dose reduced to prevent hypotension. Hydrochlorothiazide  held to assess stability. - Reduce lisinopril  to every other day until supply exhausted. - Hold hydrochlorothiazide , use as needed for swelling. - Nurse visit in two weeks to re-evaluate blood pressure. - Monitor blood pressure daily, report if <100/60.     Class 1 obesity due to excess calories with serious comorbidity and body mass index (BMI) of 32.0 to 32.9 in adult Assessment & Plan: Current weight 177-176 lbs, goal 160 lbs. Weight loss ongoing with dietary changes and Mounjaro . - Continue weight loss efforts with dietary modifications and Mounjaro . - Follow-up with weight management program in March. - Aim for at least 150 minutes of exercise per week, including strength training at least twice per week.  Herpes zoster vaccination declined    Return in 2 weeks (on 06/09/2024), or [place] - bp check, hctz was stopped, for 1 year physical, 6 month bp. Patient was given opportunity to ask questions. Patient verbalized understanding of the plan and was able to repeat key elements of the plan. All questions were answered to their satisfaction.   I, [name], MD, have reviewed all documentation for this visit. The documentation on 05/26/24 for the exam, diagnosis, procedures, and orders are all accurate  and complete.      [1]  Social History Tobacco Use  Smoking Status Former   Current packs/day: 0.00   Average packs/day: 1 pack/day for 4.0 years (4.0 ttl pk-yrs)   Types: Cigarettes   Start date: 05/07/1972   Quit date: 05/06/1976   Years since quitting: 48.1  Smokeless Tobacco Never

## 2024-05-26 ENCOUNTER — Ambulatory Visit: Payer: Medicare PPO | Admitting: Internal Medicine

## 2024-05-26 VITALS — BP 100/60 | HR 67 | Temp 97.9°F | Ht 63.0 in | Wt 185.0 lb

## 2024-05-26 DIAGNOSIS — Z0000 Encounter for general adult medical examination without abnormal findings: Secondary | ICD-10-CM | POA: Diagnosis not present

## 2024-05-26 DIAGNOSIS — E559 Vitamin D deficiency, unspecified: Secondary | ICD-10-CM

## 2024-05-26 DIAGNOSIS — E66811 Obesity, class 1: Secondary | ICD-10-CM

## 2024-05-26 DIAGNOSIS — E785 Hyperlipidemia, unspecified: Secondary | ICD-10-CM

## 2024-05-26 DIAGNOSIS — R413 Other amnesia: Secondary | ICD-10-CM

## 2024-05-26 DIAGNOSIS — Z6832 Body mass index (BMI) 32.0-32.9, adult: Secondary | ICD-10-CM | POA: Diagnosis not present

## 2024-05-26 DIAGNOSIS — E1169 Type 2 diabetes mellitus with other specified complication: Secondary | ICD-10-CM | POA: Diagnosis not present

## 2024-05-26 DIAGNOSIS — I10 Essential (primary) hypertension: Secondary | ICD-10-CM

## 2024-05-26 DIAGNOSIS — E6609 Other obesity due to excess calories: Secondary | ICD-10-CM | POA: Diagnosis not present

## 2024-05-26 DIAGNOSIS — Z2821 Immunization not carried out because of patient refusal: Secondary | ICD-10-CM

## 2024-05-26 LAB — POCT URINALYSIS DIP (CLINITEK)
Bilirubin, UA: NEGATIVE
Blood, UA: NEGATIVE
Glucose, UA: NEGATIVE mg/dL
Ketones, POC UA: NEGATIVE mg/dL
Leukocytes, UA: NEGATIVE
Nitrite, UA: NEGATIVE
POC PROTEIN,UA: NEGATIVE
Spec Grav, UA: 1.01
Urobilinogen, UA: 0.2 U/dL
pH, UA: 5.5

## 2024-05-27 LAB — CBC
Hematocrit: 39.6 % (ref 34.0–46.6)
Hemoglobin: 12.7 g/dL (ref 11.1–15.9)
MCH: 30.7 pg (ref 26.6–33.0)
MCHC: 32.1 g/dL (ref 31.5–35.7)
MCV: 96 fL (ref 79–97)
Platelets: 227 x10E3/uL (ref 150–450)
RBC: 4.14 x10E6/uL (ref 3.77–5.28)
RDW: 12.7 % (ref 11.7–15.4)
WBC: 5.7 x10E3/uL (ref 3.4–10.8)

## 2024-05-27 LAB — CMP14+EGFR
ALT: 28 IU/L (ref 0–32)
AST: 29 IU/L (ref 0–40)
Albumin: 4.2 g/dL (ref 3.9–4.9)
Alkaline Phosphatase: 71 IU/L (ref 49–135)
BUN/Creatinine Ratio: 29 — ABNORMAL HIGH (ref 12–28)
BUN: 24 mg/dL (ref 8–27)
Bilirubin Total: 0.7 mg/dL (ref 0.0–1.2)
CO2: 25 mmol/L (ref 20–29)
Calcium: 9.7 mg/dL (ref 8.7–10.3)
Chloride: 99 mmol/L (ref 96–106)
Creatinine, Ser: 0.84 mg/dL (ref 0.57–1.00)
Globulin, Total: 3 g/dL (ref 1.5–4.5)
Glucose: 79 mg/dL (ref 70–99)
Potassium: 4.4 mmol/L (ref 3.5–5.2)
Sodium: 138 mmol/L (ref 134–144)
Total Protein: 7.2 g/dL (ref 6.0–8.5)
eGFR: 76 mL/min/1.73

## 2024-05-27 LAB — MICROALBUMIN / CREATININE URINE RATIO
Creatinine, Urine: 76.9 mg/dL
Microalb/Creat Ratio: <4 mg/g{creat} (ref 0–29)
Microalbumin, Urine: <3 ug/mL

## 2024-05-27 LAB — HEMOGLOBIN A1C
Est. average glucose Bld gHb Est-mCnc: 114 mg/dL
Hgb A1c MFr Bld: 5.6 % (ref 4.8–5.6)

## 2024-05-29 ENCOUNTER — Other Ambulatory Visit: Payer: Self-pay | Admitting: Internal Medicine

## 2024-05-29 DIAGNOSIS — E1169 Type 2 diabetes mellitus with other specified complication: Secondary | ICD-10-CM

## 2024-05-29 DIAGNOSIS — I10 Essential (primary) hypertension: Secondary | ICD-10-CM

## 2024-05-29 DIAGNOSIS — E119 Type 2 diabetes mellitus without complications: Secondary | ICD-10-CM

## 2024-06-01 ENCOUNTER — Ambulatory Visit: Payer: Self-pay | Admitting: Internal Medicine

## 2024-06-02 ENCOUNTER — Encounter: Payer: Self-pay | Admitting: Internal Medicine

## 2024-06-02 DIAGNOSIS — E66811 Obesity, class 1: Secondary | ICD-10-CM | POA: Insufficient documentation

## 2024-06-02 NOTE — Assessment & Plan Note (Signed)

## 2024-06-02 NOTE — Assessment & Plan Note (Signed)
 Chronic, low end of normal. She denies having any dizziness at this time. Blood pressure low, no dizziness.  EKG performed, NSR w/ T abnormality - denies cp, sob w/ exertion.   Lisinopril  dose reduced to prevent hypotension. Hydrochlorothiazide  held to assess stability. - Reduce lisinopril  to every other day until supply exhausted. - Hold hydrochlorothiazide , use as needed for swelling. - Nurse visit in two weeks to re-evaluate blood pressure. - Monitor blood pressure daily, report if <100/60.

## 2024-06-02 NOTE — Assessment & Plan Note (Signed)
 Chronic, LDL goal is less than 70. Diabetic foot exam was performed.   Diabetes well-controlled, stable A1c. Dyslipidemia managed with rosuvastatin . Cardiac calcium  score 52, necessitating continued cholesterol management. - Continue rosuvastatin . - Continue with Mounjaro  15mg  weekly. - A1c testing every six months. - Encouraged dietary changes: reduce sweets and bread, incorporate lamb and cruciferous vegetables. - I DISCUSSED WITH THE PATIENT AT LENGTH REGARDING THE GOALS OF GLYCEMIC CONTROL AND POSSIBLE LONG-TERM COMPLICATIONS.  I  ALSO STRESSED THE IMPORTANCE OF COMPLIANCE WITH HOME GLUCOSE MONITORING, DIETARY RESTRICTIONS INCLUDING AVOIDANCE OF SUGARY DRINKS/PROCESSED FOODS,  ALONG WITH REGULAR EXERCISE.  I  ALSO STRESSED THE IMPORTANCE OF ANNUAL EYE EXAMS, SELF FOOT CARE AND COMPLIANCE WITH OFFICE VISITS.

## 2024-06-02 NOTE — Assessment & Plan Note (Signed)
 Current weight 177-176 lbs, goal 160 lbs. Weight loss ongoing with dietary changes and Mounjaro . - Continue weight loss efforts with dietary modifications and Mounjaro . - Follow-up with weight management program in March. - Aim for at least 150 minutes of exercise per week, including strength training at least twice per week.

## 2024-06-09 ENCOUNTER — Ambulatory Visit: Payer: Self-pay

## 2024-06-09 ENCOUNTER — Ambulatory Visit

## 2024-06-26 ENCOUNTER — Ambulatory Visit

## 2024-07-28 ENCOUNTER — Ambulatory Visit: Admitting: Family Medicine

## 2024-11-24 ENCOUNTER — Ambulatory Visit: Payer: Self-pay | Admitting: Internal Medicine

## 2025-02-10 ENCOUNTER — Ambulatory Visit: Payer: Self-pay

## 2025-05-27 ENCOUNTER — Encounter: Payer: Self-pay | Admitting: Internal Medicine
